# Patient Record
Sex: Female | Born: 1950 | Race: White | Hispanic: No | Smoking: Never smoker
Health system: Southern US, Community
[De-identification: ages and names within clinical notes are randomized; demographics above are authoritative.]

## PROBLEM LIST (undated history)

## (undated) HISTORY — PX: CORONARY ARTERY BYPASS GRAFT: SHX141

## (undated) HISTORY — PX: CARPAL TUNNEL RELEASE: SHX101

## (undated) HISTORY — PX: WRIST FRACTURE SURGERY: SHX121

## (undated) HISTORY — PX: CHOLECYSTECTOMY: SHX55

## (undated) HISTORY — PX: DILATION AND CURETTAGE OF UTERUS: SHX78

---

## 2014-08-27 DIAGNOSIS — L03116 Cellulitis of left lower limb: Secondary | ICD-10-CM | POA: Insufficient documentation

## 2016-05-11 DIAGNOSIS — E1165 Type 2 diabetes mellitus with hyperglycemia: Secondary | ICD-10-CM | POA: Insufficient documentation

## 2016-05-11 DIAGNOSIS — E785 Hyperlipidemia, unspecified: Secondary | ICD-10-CM | POA: Insufficient documentation

## 2016-05-11 DIAGNOSIS — I10 Essential (primary) hypertension: Secondary | ICD-10-CM | POA: Insufficient documentation

## 2016-05-11 HISTORY — DX: Type 2 diabetes mellitus with hyperglycemia: E11.65

## 2016-05-11 HISTORY — DX: Essential (primary) hypertension: I10

## 2016-05-11 HISTORY — DX: Hyperlipidemia, unspecified: E78.5

## 2016-05-12 DIAGNOSIS — J4520 Mild intermittent asthma, uncomplicated: Secondary | ICD-10-CM | POA: Insufficient documentation

## 2016-05-12 DIAGNOSIS — J45909 Unspecified asthma, uncomplicated: Secondary | ICD-10-CM

## 2016-05-12 HISTORY — DX: Unspecified asthma, uncomplicated: J45.909

## 2016-05-12 HISTORY — DX: Mild intermittent asthma, uncomplicated: J45.20

## 2017-02-14 DIAGNOSIS — N1832 Chronic kidney disease, stage 3b: Secondary | ICD-10-CM

## 2017-02-14 DIAGNOSIS — N182 Chronic kidney disease, stage 2 (mild): Secondary | ICD-10-CM | POA: Insufficient documentation

## 2017-02-14 HISTORY — DX: Chronic kidney disease, stage 3b: N18.32

## 2017-02-14 HISTORY — DX: Chronic kidney disease, stage 2 (mild): N18.2

## 2018-06-20 DIAGNOSIS — R413 Other amnesia: Secondary | ICD-10-CM | POA: Insufficient documentation

## 2018-06-20 DIAGNOSIS — R251 Tremor, unspecified: Secondary | ICD-10-CM | POA: Insufficient documentation

## 2018-06-20 DIAGNOSIS — R195 Other fecal abnormalities: Secondary | ICD-10-CM | POA: Insufficient documentation

## 2018-06-20 HISTORY — DX: Other amnesia: R41.3

## 2018-06-20 HISTORY — DX: Other fecal abnormalities: R19.5

## 2018-06-20 HISTORY — DX: Tremor, unspecified: R25.1

## 2018-09-12 DIAGNOSIS — Z9119 Patient's noncompliance with other medical treatment and regimen: Secondary | ICD-10-CM

## 2018-09-12 DIAGNOSIS — Z91199 Patient's noncompliance with other medical treatment and regimen due to unspecified reason: Secondary | ICD-10-CM

## 2018-09-12 HISTORY — DX: Patient's noncompliance with other medical treatment and regimen due to unspecified reason: Z91.199

## 2018-09-12 HISTORY — DX: Patient's noncompliance with other medical treatment and regimen: Z91.19

## 2019-05-29 DIAGNOSIS — L03116 Cellulitis of left lower limb: Secondary | ICD-10-CM

## 2019-05-29 HISTORY — DX: Cellulitis of left lower limb: L03.116

## 2019-07-18 ENCOUNTER — Other Ambulatory Visit: Payer: Self-pay

## 2019-07-18 ENCOUNTER — Ambulatory Visit (INDEPENDENT_AMBULATORY_CARE_PROVIDER_SITE_OTHER): Payer: Medicare Other | Admitting: Cardiology

## 2019-07-18 ENCOUNTER — Encounter: Payer: Self-pay | Admitting: Cardiology

## 2019-07-18 VITALS — BP 142/72 | HR 75 | Ht 59.5 in | Wt 169.8 lb

## 2019-07-18 DIAGNOSIS — I2510 Atherosclerotic heart disease of native coronary artery without angina pectoris: Secondary | ICD-10-CM

## 2019-07-18 DIAGNOSIS — R809 Proteinuria, unspecified: Secondary | ICD-10-CM

## 2019-07-18 DIAGNOSIS — I255 Ischemic cardiomyopathy: Secondary | ICD-10-CM

## 2019-07-18 DIAGNOSIS — IMO0002 Reserved for concepts with insufficient information to code with codable children: Secondary | ICD-10-CM | POA: Insufficient documentation

## 2019-07-18 DIAGNOSIS — E1129 Type 2 diabetes mellitus with other diabetic kidney complication: Secondary | ICD-10-CM

## 2019-07-18 DIAGNOSIS — I509 Heart failure, unspecified: Secondary | ICD-10-CM | POA: Insufficient documentation

## 2019-07-18 DIAGNOSIS — Z951 Presence of aortocoronary bypass graft: Secondary | ICD-10-CM

## 2019-07-18 DIAGNOSIS — K219 Gastro-esophageal reflux disease without esophagitis: Secondary | ICD-10-CM

## 2019-07-18 DIAGNOSIS — J4520 Mild intermittent asthma, uncomplicated: Secondary | ICD-10-CM

## 2019-07-18 DIAGNOSIS — I10 Essential (primary) hypertension: Secondary | ICD-10-CM

## 2019-07-18 DIAGNOSIS — I25810 Atherosclerosis of coronary artery bypass graft(s) without angina pectoris: Secondary | ICD-10-CM | POA: Insufficient documentation

## 2019-07-18 DIAGNOSIS — I429 Cardiomyopathy, unspecified: Secondary | ICD-10-CM | POA: Insufficient documentation

## 2019-07-18 DIAGNOSIS — F411 Generalized anxiety disorder: Secondary | ICD-10-CM | POA: Insufficient documentation

## 2019-07-18 DIAGNOSIS — I5043 Acute on chronic combined systolic (congestive) and diastolic (congestive) heart failure: Secondary | ICD-10-CM

## 2019-07-18 DIAGNOSIS — E1165 Type 2 diabetes mellitus with hyperglycemia: Secondary | ICD-10-CM

## 2019-07-18 DIAGNOSIS — Z794 Long term (current) use of insulin: Secondary | ICD-10-CM

## 2019-07-18 HISTORY — DX: Type 2 diabetes mellitus with other diabetic kidney complication: E11.65

## 2019-07-18 HISTORY — DX: Heart failure, unspecified: I50.9

## 2019-07-18 HISTORY — DX: Generalized anxiety disorder: F41.1

## 2019-07-18 HISTORY — DX: Cardiomyopathy, unspecified: I42.9

## 2019-07-18 HISTORY — DX: Type 2 diabetes mellitus with other diabetic kidney complication: E11.29

## 2019-07-18 HISTORY — DX: Essential (primary) hypertension: I10

## 2019-07-18 HISTORY — DX: Reserved for concepts with insufficient information to code with codable children: IMO0002

## 2019-07-18 HISTORY — DX: Presence of aortocoronary bypass graft: Z95.1

## 2019-07-18 HISTORY — DX: Atherosclerotic heart disease of native coronary artery without angina pectoris: I25.10

## 2019-07-18 HISTORY — DX: Gastro-esophageal reflux disease without esophagitis: K21.9

## 2019-07-18 MED ORDER — FUROSEMIDE 40 MG PO TABS: 40.0000 mg | ORAL_TABLET | Freq: Every day | ORAL | 2 refills | Status: DC

## 2019-07-18 MED ORDER — POTASSIUM CHLORIDE ER 10 MEQ PO TBCR: 10.0000 meq | EXTENDED_RELEASE_TABLET | Freq: Every day | ORAL | 2 refills | Status: DC

## 2019-07-18 NOTE — Patient Instructions (Addendum)
Medication Instructions:  Your physician recommends that you continue on your current medications as directed. Please refer to the Current Medication list given to you today:   START: Lasix 40 mg daily  START: Potasium 10 meq daily   *If you need a refill on your cardiac medications before your next appointment, please call your pharmacy*  Lab Work: Your physician recommends that you return for lab work today: pro bnp                                                                                                   Return in 2 weeks: pro bnp and bmp   If you have labs (blood work) drawn today and your tests are completely normal, you will receive your results only by: Marland Kitchen MyChart Message (if you have MyChart) OR . A paper copy in the mail If you have any lab test that is abnormal or we need to change your treatment, we will call you to review the results.  Testing/Procedures: None.   Follow-Up: At [facility], you and your health needs are our priority.  As part of our continuing mission to provide you with exceptional heart care, we have created designated Provider Care Teams.  These Care Teams include your primary Cardiologist (physician) and Advanced Practice Providers (APPs -  Physician Assistants and Nurse Practitioners) who all work together to provide you with the care you need, when you need it.  Your next appointment:   1 week(s)  The format for your next appointment:   In Person  Provider:   [name], MD  Other Instructions  Furosemide tablets What is this medicine? FUROSEMIDE (fyoor [place] se mide) is a diuretic. It helps you make more urine and to lose salt and excess water from your body. This medicine is used to treat high blood pressure, and edema or swelling from heart, kidney, or liver disease. This medicine may be used for other purposes; ask your health care provider or pharmacist if you have questions. COMMON BRAND NAME(S): Active-Medicated Specimen Kit,  Delone, Diuscreen, Lasix, RX Specimen Collection Kit, Specimen Collection Kit, URINX Medicated Specimen Collection What should I tell my health care provider before I take this medicine? They need to know if you have any of these conditions:  abnormal blood electrolytes  diarrhea or vomiting  gout  heart disease  kidney disease, small amounts of urine, or difficulty passing urine  liver disease  thyroid disease  an unusual or allergic reaction to furosemide, sulfa drugs, other medicines, foods, dyes, or preservatives  pregnant or trying to get pregnant  breast-feeding How should I use this medicine? Take this medicine by mouth with a glass of water. Follow the directions on the prescription label. You may take this medicine with or without food. If it upsets your stomach, take it with food or milk. Do not take your medicine more often than directed. Remember that you will need to pass more urine after taking this medicine. Do not take your medicine at a time of day that will cause you problems. Do not take at bedtime. Talk  to your pediatrician regarding the use of this medicine in children. While this drug may be prescribed for selected conditions, precautions do apply. Overdosage: If you think you have taken too much of this medicine contact a poison control center or emergency room at once. NOTE: This medicine is only for you. Do not share this medicine with others. What if I miss a dose? If you miss a dose, take it as soon as you can. If it is almost time for your next dose, take only that dose. Do not take double or extra doses. What may interact with this medicine?  aspirin and aspirin-like medicines  certain antibiotics  chloral hydrate  cisplatin  cyclosporine  digoxin  diuretics  laxatives  lithium  medicines for blood pressure  medicines that relax muscles for surgery  methotrexate  NSAIDs, medicines for pain and inflammation like ibuprofen, naproxen,  or indomethacin  phenytoin  steroid medicines like prednisone or cortisone  sucralfate  thyroid hormones This list may not describe all possible interactions. Give your health care provider a list of all the medicines, herbs, non-prescription drugs, or dietary supplements you use. Also tell them if you smoke, drink alcohol, or use illegal drugs. Some items may interact with your medicine. What should I watch for while using this medicine? Visit your doctor or health care provider for regular checks on your progress. Check your blood pressure regularly. Ask your doctor or health care provider what your blood pressure should be, and when you should contact him or her. If you are a diabetic, check your blood sugar as directed. This medicine may cause serious skin reactions. They can happen weeks to months after starting the medicine. Contact your health care provider right away if you notice fevers or flu-like symptoms with a rash. The rash may be red or purple and then turn into blisters or peeling of the skin. Or, you might notice a red rash with swelling of the face, lips or lymph nodes in your neck or under your arms. You may need to be on a special diet while taking this medicine. Check with your doctor. Also, ask how many glasses of fluid you need to drink a day. You must not get dehydrated. You may get drowsy or dizzy. Do not drive, use machinery, or do anything that needs mental alertness until you know how this drug affects you. Do not stand or sit up quickly, especially if you are an older patient. This reduces the risk of dizzy or fainting spells. Alcohol can make you more drowsy and dizzy. Avoid alcoholic drinks. This medicine can make you more sensitive to the sun. Keep out of the sun. If you cannot avoid being in the sun, wear protective clothing and use sunscreen. Do not use sun lamps or tanning beds/booths. What side effects may I notice from receiving this medicine? Side effects that  you should report to your doctor or health care professional as soon as possible:  blood in urine or stools  dry mouth  fever or chills  hearing loss or ringing in the ears  irregular heartbeat  muscle pain or weakness, cramps  rash, fever, and swollen lymph nodes  redness, blistering, peeling or loosening of the skin, including inside the mouth  skin rash  stomach upset, pain, or nausea  tingling or numbness in the hands or feet  unusually weak or tired  vomiting or diarrhea  yellowing of the eyes or skin Side effects that usually do not require medical attention (report  to your doctor or health care professional if they continue or are bothersome):  headache  loss of appetite  unusual bleeding or bruising This list may not describe all possible side effects. Call your doctor for medical advice about side effects. You may report side effects to FDA at 1-800-FDA-1088. Where should I keep my medicine? Keep out of the reach of children. Store at room temperature between 15 and 30 degrees C (59 and 86 degrees F). Protect from light. Throw away any unused medicine after the expiration date. NOTE: This sheet is a summary. It may not cover all possible information. If you have questions about this medicine, talk to your doctor, pharmacist, or health care provider.  2020 Elsevier/Gold Standard (2018-10-19 14:04:13)  Potassium chloride tablets, extended-release tablets or capsules What is this medicine? POTASSIUM CHLORIDE (poe TASS i um KLOOR ide) is a potassium supplement used to prevent and to treat low potassium. Potassium is important for the heart, muscles, and nerves. Too much or too little potassium in the body can cause serious problems. This medicine may be used for other purposes; ask your health care provider or pharmacist if you have questions. COMMON BRAND NAME(S): ED-K+10, K-10, K-8, K-Dur, K-Tab, Kaon-CL, Klor-Con, Klor-Con M10, Klor-Con M15, Klor-Con M20,  Klotrix, Micro-K, Micro-K Extencaps, Slow-K What should I tell my health care provider before I take this medicine? They need to know if you have any of these conditions:  Addison's disease  dehydration  diabetes  difficulty swallowing  heart disease  high levels of potassium in the blood  irregular heartbeat  kidney disease  recent severe burn  stomach ulcers or other stomach problems  an unusual or allergic reaction to potassium, tartrazine, other medicines, foods, dyes, or preservatives  pregnant or trying to get pregnant  breast-feeding How should I use this medicine? Take this medicine by mouth with a full glass of water. Take with food. Follow the directions on the prescription label. Do not suck on, crush, or chew this medicine. If you have difficulty swallowing, ask the pharmacist how to take. Take your medicine at regular intervals. Do not take it more often than directed. Do not stop taking except on your doctor's advice. Talk to your pediatrician regarding the use of this medicine in children. Special care may be needed. Overdosage: If you think you have taken too much of this medicine contact a poison control center or emergency room at once. NOTE: This medicine is only for you. Do not share this medicine with others. What if I miss a dose? If you miss a dose, take it as soon as you can. If it is almost time for your next dose, take only that dose. Do not take double or extra doses. What may interact with this medicine? Do not take this medicine with any of the following medications:  certain diuretics such as spironolactone, triamterene  certain medicines for stomach problems like atropine; difenoxin and glycopyrrolate  eplerenone  sodium polystyrene sulfonate This medicine may also interact with the following medications:  certain medicines for blood pressure or heart disease like lisinopril, losartan, quinapril, valsartan  medicines that lower your  chance of fighting infection such as cyclosporine, tacrolimus  NSAIDs, medicines for pain and inflammation, like ibuprofen or naproxen  other potassium supplements  salt substitutes This list may not describe all possible interactions. Give your health care provider a list of all the medicines, herbs, non-prescription drugs, or dietary supplements you use. Also tell them if you smoke, drink alcohol, or  use illegal drugs. Some items may interact with your medicine. What should I watch for while using this medicine? Visit your doctor or health care professional for regular check ups. You will need lab work done regularly. You may need to be on a special diet while taking this medicine. Ask your doctor. What side effects may I notice from receiving this medicine? Side effects that you should report to your doctor or health care professional as soon as possible:  allergic reactions like skin rash, itching or hives, swelling of the face, lips, or tongue  black, tarry stools  breathing problems  confusion  heartburn  fast, irregular heartbeat  feeling faint or lightheaded, falls  low blood pressure  numbness or tingling in hands or feet  pain when swallowing  unusually weak or tired  weakness, heaviness of legs Side effects that usually do not require medical attention (report to your doctor or health care professional if they continue or are bothersome):  diarrhea  nausea, vomiting  stomach pain This list may not describe all possible side effects. Call your doctor for medical advice about side effects. You may report side effects to FDA at 1-800-FDA-1088. Where should I keep my medicine? Keep out of the reach of children. Store at room temperature between 15 and 30 degrees C (59 and 86 degrees F ). Keep bottle closed tightly to protect this medicine from light and moisture. Throw away any unused medicine after the expiration date. NOTE: This sheet is a summary. It may not  cover all possible information. If you have questions about this medicine, talk to your doctor, pharmacist, or health care provider.  2020 Elsevier/Gold Standard (2016-04-20 11:43:27)

## 2019-07-18 NOTE — Progress Notes (Signed)
Cardiology Consultation:    Date:  07/18/2019   ID:  Alexandra Baker, DOB April 19, 1951, MRN [id number]  PCP:  Alma Friendly, MD  Cardiologist:  [name], MD   Referring MD: Alma Friendly, MD   Chief Complaint  Patient presents with  . Coronary Artery Disease  . Hypertension    History of Present Illness:    Alexandra Baker is a 68 y.o. female who is being seen today for the evaluation of short of breath at the request of Alma Friendly, MD.  Past medical history significant for coronary artery disease, she did have coronary bypass grafting 1999 secondary to myocardial infarction.  It was done at [facility].  She also does have history of diabetes which is poorly controlled, dyslipidemia, hypertension.  Recently she is being seen pulmonary specialist because of progressive shortness of breath.  She also described to have some swelling of lower extremities.  Progressive shortness of breath getting worse day by day.  She cannot sleep at night in the bed laying flat because of shortness of breath.  She spent her night sitting in the chair.  She cannot tell me if she gained any weight recently.  But she described to have swelling of lower extremities which is progressively been getting worse.  She being treated for cellulitis with some limited success.  Denies having any chest pain tightness squeezing pressure burning chest.  She does not smoke, never did, does have history of hyper lipidemia.   No past medical history on file.    Current Medications: Current Meds  Medication Sig  . albuterol (VENTOLIN HFA) 108 (90 Base) MCG/ACT inhaler Inhale 2 puffs into the lungs every 6 (six) hours as needed.  . Aspirin Buf,CaCarb-MgCarb-MgO, 81 MG TABS Take 1 tablet by mouth daily.  Marland Kitchen atenolol (TENORMIN) 50 MG tablet Take 50 mg by mouth daily.  . budesonide-formoterol (SYMBICORT) 160-4.5 MCG/ACT inhaler Inhale 2 puffs into the lungs 2 (two) times daily.  . Insulin  Glargine, 1 Unit Dial, (TOUJEO SOLOSTAR) 300 UNIT/ML SOPN Inject 40 Units into the skin daily.  Marland Kitchen lisinopril-hydrochlorothiazide (ZESTORETIC) 20-12.5 MG tablet Take 1 tablet by mouth daily.  Marland Kitchen loratadine (CLARITIN) 10 MG tablet Take 1 mg by mouth daily.  . metFORMIN (GLUCOPHAGE) 1000 MG tablet Take 1,000 mg by mouth 2 (two) times daily.  . Multiple Vitamin (MULTIVITAMIN) capsule Take 1 capsule by mouth daily.  . nitroGLYCERIN (NITROLINGUAL) 0.4 MG/SPRAY spray Place 1 spray under the tongue as needed for chest pain.  Marland Kitchen omeprazole (PRILOSEC) 40 MG capsule Take 1 capsule by mouth daily.  . rosuvastatin (CRESTOR) 40 MG tablet Take 40 mg by mouth daily.     Allergies:   Tea, Ezetimibe-simvastatin, and Pitocin [oxytocin]   Social History   Socioeconomic History  . Marital status: Married    Spouse name: Not on file  . Number of children: Not on file  . Years of education: Not on file  . Highest education level: Not on file  Occupational History  . Not on file  Tobacco Use  . Smoking status: Never Smoker  . Smokeless tobacco: Never Used  Substance and Sexual Activity  . Alcohol use: Never  . Drug use: Never  . Sexual activity: Not on file  Other Topics Concern  . Not on file  Social History Narrative  . Not on file   Social Determinants of Health   Financial Resource Strain:   . Difficulty of Paying Living Expenses: Not on file  Food Insecurity:   .  Worried About Charity fundraiser in the Last Year: Not on file  . Ran Out of Food in the Last Year: Not on file  Transportation Needs:   . Lack of Transportation (Medical): Not on file  . Lack of Transportation (Non-Medical): Not on file  Physical Activity:   . Days of Exercise per Week: Not on file  . Minutes of Exercise per Session: Not on file  Stress:   . Feeling of Stress : Not on file  Social Connections:   . Frequency of Communication with Friends and Family: Not on file  . Frequency of Social Gatherings with Friends  and Family: Not on file  . Attends Religious Services: Not on file  . Active Member of Clubs or Organizations: Not on file  . Attends Archivist Meetings: Not on file  . Marital Status: Not on file     Family History: [name]; Aneurysm in her father; Breast cancer in her mother; Hypertension in her mother; Kidney failure in her mother. ROS:   Please see the history of present illness.    All 14 point review of systems negative except as described per history of present illness.  EKGs/Labs/Other Studies Reviewed:    The following studies were reviewed today: Echocardiogram reviewed done at [facility] showed: Left Ventricle: Systolic function is mildly decreased with an ejection  fraction of 45-50%. . Left Ventricle: There is grade II (moderate) diastolic dysfunction. . Mitral Valve: There is mild regurgitation. . Tricuspid Valve: There is mild regurgitation. . Tricuspid Valve: The estimated pulmonary pressures are moderately  elevated.   Recent Labs: No results found for requested labs within last 8760 hours.  Recent Lipid Panel No results found for: CHOL, TRIG, HDL, CHOLHDL, VLDL, LDLCALC, LDLDIRECT  Physical Exam:    VS:  BP (!) 142/72   Pulse 75   Ht 4' 11.5" (1.511 m)   Wt 169 lb 12.8 oz (77 kg)   SpO2 96%   BMI 33.72 kg/m     Wt Readings from Last 3 Encounters:  07/18/19 169 lb 12.8 oz (77 kg)     GEN:  Well nourished, well developed in no acute distress HEENT: Normal NECK: JVD elevated at 45 degrees already LYMPHATICS: No lymphadenopathy CARDIAC: RRR, no murmurs, no rubs, no gallops RESPIRATORY:  Clear to auscultation without rales, wheezing or rhonchi  ABDOMEN: Soft, non-tender, non-distended MUSCULOSKELETAL:  No edema; No deformity  SKIN: Warm and dry NEUROLOGIC:  Alert and oriented x 3 PSYCHIATRIC:  Normal affect  Lower extremities 1+ pitting edema  ASSESSMENT:    1.  Uncontrolled type 2 diabetes mellitus with microalbuminuria, with long-term current use of insulin ([name])   2. Status post coronary artery bypass graft 1999 done at [name]   3. Acute on chronic combined systolic and diastolic congestive heart failure ([place])   4. Ischemic cardiomyopathy   5. Mild intermittent asthma without complication   6. Benign essential hypertension    PLAN:    In order of problems listed above:  1. Congestive heart failure with she appears to be at least mildly decompensated.  I will initiate diuresis.  I will give her 40 mg of Lasix with 10 mg of potassium.  proBNP will be checked today within the next week or so we will recheck her proBNP as well as Chem-7.  In the future anticipate need to switch her from Zestoretic to [place]. 2. In terms of etiology of  her cardiomyopathy which is ejection fraction 4045% probably ischemic is most likely explanation.  In the future we will need to do ischemia work-up.  Which we will do it depends on how she will progress. 3. Questionable pulmonary hypertension I do have description of her echocardiogram from [place].  It said to moderate pulmonary hypertension however what I can gather from the report is pulmonary pressure 45 mmHg.  Etiology of this phenomenon is unclear at this moment.  She is scheduled to have a sleep study however she cannot lay flat we need to get her congestive heart failure better before proceeding with sleep study.  Therefore again diuresis will be initiated I will see her back in my office within a week or so and will aggressively quickly proceed with work-up 20 forgot exactly what the problem is.  She may require cardiac catheterization to reassess her pulmonary pressure as well as look at her coronary arteries.  I hope that her pulmonary pressure will improve with better management of her heart issues.  I still think it would be reasonable to perform sleep study. 4. Ischemic cardiomyopathy she is  on Zestoretic which is lisinopril, she is not on Tenormin which I will continue for now but in the future anticipated to need to switch to probably carvedilol.  Also will hopefully in the future switch from Zestoretic to [facility]. 5.  Diabetes mellitus poorly controlled.  Will talk to primary care physician try to see if we can improve control of this problem.  She admits that she is not sticking with good diet. Cholesterol status she is taking high intensity statin in form of Crestor which I will continue for now.  Overall very complicated situation will quickly try to diurese her see if she feels any better.  Then will perform work-up for her CAD as well as for potential pulmonary hypertension.   Medication Adjustments/Labs and Tests Ordered: Current medicines are reviewed at length with the patient today.  Concerns regarding medicines are outlined above.  No orders of the defined types were placed in this encounter.  No orders of the defined types were placed in this encounter.   Signed, Park Liter, MD, [facility]. 07/18/2019 11:05 AM    [place]

## 2019-07-19 LAB — PRO B NATRIURETIC PEPTIDE: NT-Pro BNP: 3381 pg/mL — ABNORMAL HIGH (ref 0–301)

## 2019-07-22 ENCOUNTER — Telehealth: Payer: Self-pay | Admitting: Emergency Medicine

## 2019-07-22 NOTE — Telephone Encounter (Signed)
Left message for patient to return call regarding results  

## 2019-07-25 ENCOUNTER — Encounter: Payer: Self-pay | Admitting: Cardiology

## 2019-07-25 ENCOUNTER — Other Ambulatory Visit: Payer: Self-pay

## 2019-07-25 ENCOUNTER — Ambulatory Visit (INDEPENDENT_AMBULATORY_CARE_PROVIDER_SITE_OTHER): Payer: Medicare Other | Admitting: Cardiology

## 2019-07-25 VITALS — BP 120/78 | HR 71 | Ht <= 58 in | Wt 158.6 lb

## 2019-07-25 DIAGNOSIS — Z951 Presence of aortocoronary bypass graft: Secondary | ICD-10-CM

## 2019-07-25 DIAGNOSIS — R809 Proteinuria, unspecified: Secondary | ICD-10-CM

## 2019-07-25 DIAGNOSIS — E1129 Type 2 diabetes mellitus with other diabetic kidney complication: Secondary | ICD-10-CM

## 2019-07-25 DIAGNOSIS — I255 Ischemic cardiomyopathy: Secondary | ICD-10-CM | POA: Diagnosis not present

## 2019-07-25 DIAGNOSIS — IMO0002 Reserved for concepts with insufficient information to code with codable children: Secondary | ICD-10-CM

## 2019-07-25 DIAGNOSIS — I10 Essential (primary) hypertension: Secondary | ICD-10-CM | POA: Diagnosis not present

## 2019-07-25 DIAGNOSIS — I5043 Acute on chronic combined systolic (congestive) and diastolic (congestive) heart failure: Secondary | ICD-10-CM

## 2019-07-25 DIAGNOSIS — Z794 Long term (current) use of insulin: Secondary | ICD-10-CM

## 2019-07-25 DIAGNOSIS — E1165 Type 2 diabetes mellitus with hyperglycemia: Secondary | ICD-10-CM

## 2019-07-25 NOTE — Patient Instructions (Signed)
Medication Instructions:  Your physician recommends that you continue on your current medications as directed. Please refer to the Current Medication list given to you today.  *If you need a refill on your cardiac medications before your next appointment, please call your pharmacy*  Lab Work: Your physician recommends that you return for lab work today: bmp, pro bnp   If you have labs (blood work) drawn today and your tests are completely normal, you will receive your results only by: Marland Kitchen MyChart Message (if you have MyChart) OR . A paper copy in the mail If you have any lab test that is abnormal or we need to change your treatment, we will call you to review the results.  Testing/Procedures: None.   Follow-Up: At [facility], you and your health needs are our priority.  As part of our continuing mission to provide you with exceptional heart care, we have created designated Provider Care Teams.  These Care Teams include your primary Cardiologist (physician) and Advanced Practice Providers (APPs -  Physician Assistants and Nurse Practitioners) who all work together to provide you with the care you need, when you need it.  Your next appointment:   3 week(s)  The format for your next appointment:   In Person  Provider:   [name], MD  Other Instructions

## 2019-07-25 NOTE — Progress Notes (Signed)
Cardiology Office Note:    Date:  07/25/2019   ID:  Alexandra Baker, DOB 01-Oct-1950, MRN [id number]  PCP:  Alma Friendly, MD  Cardiologist:  [name], MD    Referring MD: Alma Friendly, MD   Chief Complaint  Patient presents with  . Follow-up    History of Present Illness:    Alexandra Baker is a 68 y.o. female with past medical history significant for coronary artery disease, coronary bypass graft 1999 at [facility], cardiomyopathy with latest estimation of ejection fraction 40 to 45%.  When I see her last time she was in trouble she was short of breath I gave her diuretic in form of Lasix 40 mg and potassium and she looks and feels dramatically better.  Still there is some swelling she lost 11 pounds.  The purpose of visit today is to make sure she is doing well and she is  History reviewed. No pertinent past medical history.  Past Surgical History:  Procedure Laterality Date  . CARPAL TUNNEL RELEASE    . CESAREAN SECTION    . CHOLECYSTECTOMY    . CORONARY ARTERY BYPASS GRAFT    . DILATION AND CURETTAGE OF UTERUS    . WRIST FRACTURE SURGERY      Current Medications: Current Meds  Medication Sig  . albuterol (VENTOLIN HFA) 108 (90 Base) MCG/ACT inhaler Inhale 2 puffs into the lungs every 6 (six) hours as needed.  . Aspirin Buf,CaCarb-MgCarb-MgO, 81 MG TABS Take 1 tablet by mouth daily.  Marland Kitchen atenolol (TENORMIN) 50 MG tablet Take 50 mg by mouth daily.  . budesonide-formoterol (SYMBICORT) 160-4.5 MCG/ACT inhaler Inhale 1 puff into the lungs daily.   . furosemide (LASIX) 40 MG tablet Take 1 tablet (40 mg total) by mouth daily.  . Insulin Glargine, 1 Unit Dial, (TOUJEO SOLOSTAR) 300 UNIT/ML SOPN Inject 40 Units into the skin daily.  Marland Kitchen lisinopril-hydrochlorothiazide (ZESTORETIC) 20-12.5 MG tablet Take 1 tablet by mouth daily.  Marland Kitchen loratadine (CLARITIN) 10 MG tablet Take 1 mg by mouth daily.  . metFORMIN (GLUCOPHAGE) 1000 MG tablet Take 1,000 mg by  mouth 2 (two) times daily.  . Multiple Vitamin (MULTIVITAMIN) capsule Take 1 capsule by mouth daily.  . nitroGLYCERIN (NITROLINGUAL) 0.4 MG/SPRAY spray Place 1 spray under the tongue as needed for chest pain.  Marland Kitchen omeprazole (PRILOSEC) 40 MG capsule Take 1 capsule by mouth daily.  . potassium chloride (KLOR-CON) 10 MEQ tablet Take 1 tablet (10 mEq total) by mouth daily.  . rosuvastatin (CRESTOR) 40 MG tablet Take 40 mg by mouth daily.     Allergies:   Tea, Ezetimibe-simvastatin, and Pitocin [oxytocin]   Social History   Socioeconomic History  . Marital status: Married    Spouse name: Not on file  . Number of children: Not on file  . Years of education: Not on file  . Highest education level: Not on file  Occupational History  . Not on file  Tobacco Use  . Smoking status: Never Smoker  . Smokeless tobacco: Never Used  Substance and Sexual Activity  . Alcohol use: Never  . Drug use: Never  . Sexual activity: Not on file  Other Topics Concern  . Not on file  Social History Narrative  . Not on file   Social Determinants of Health   Financial Resource Strain:   . Difficulty of Paying Living Expenses: Not on file  Food Insecurity:   . Worried About Charity fundraiser in the Last Year: Not on file  .  Ran Out of Food in the Last Year: Not on file  Transportation Needs:   . Lack of Transportation (Medical): Not on file  . Lack of Transportation (Non-Medical): Not on file  Physical Activity:   . Days of Exercise per Week: Not on file  . Minutes of Exercise per Session: Not on file  Stress:   . Feeling of Stress : Not on file  Social Connections:   . Frequency of Communication with Friends and Family: Not on file  . Frequency of Social Gatherings with Friends and Family: Not on file  . Attends Religious Services: Not on file  . Active Member of Clubs or Organizations: Not on file  . Attends Archivist Meetings: Not on file  . Marital Status: Not on file      Family History: [name]; Aneurysm in her father; Breast cancer in her mother; Diabetes in her sister, sister, sister, and sister; Hypertension in her mother; Kidney failure in her mother. ROS:   Please see the history of present illness.    All 14 point review of systems negative except as described per history of present illness  EKGs/Labs/Other Studies Reviewed:      Recent Labs: 07/18/2019: NT-Pro BNP 3,381  Recent Lipid Panel No results found for: CHOL, TRIG, HDL, CHOLHDL, VLDL, LDLCALC, LDLDIRECT  Physical Exam:    VS:  BP 120/78 (BP Location: Right Arm, Patient Position: Sitting)   Pulse 71   Ht 4\' 9"  (1.448 m)   Wt 158 lb 9.6 oz (71.9 kg)   SpO2 93%   BMI 34.32 kg/m     Wt Readings from Last 3 Encounters:  07/25/19 158 lb 9.6 oz (71.9 kg)  07/18/19 169 lb 12.8 oz (77 kg)     GEN:  Well nourished, well developed in no acute distress HEENT: Normal NECK: No JVD; No carotid bruits LYMPHATICS: No lymphadenopathy CARDIAC: RRR, no murmurs, no rubs, no gallops RESPIRATORY:  Clear to auscultation without rales, wheezing or rhonchi  ABDOMEN: Soft, non-tender, non-distended MUSCULOSKELETAL:  No edema; No deformity  SKIN: Warm and dry LOWER EXTREMITIES: 1+ swelling NEUROLOGIC:  Alert and oriented x 3 PSYCHIATRIC:  Normal affect   ASSESSMENT:    1. Ischemic cardiomyopathy   2. Acute on chronic combined systolic and diastolic congestive heart failure ([place])   3. Benign essential hypertension   4. Uncontrolled type 2 diabetes mellitus with microalbuminuria, with long-term current use of insulin (HCC)   5. Status post coronary artery bypass graft 1999 done at [facility]    PLAN:    In order of problems listed above:  1. Ischemic cardiomyopathy.  On Zestoretic as well as Tenormin.  We will continue for now but in the future plan will be to switch her to [facility] as well as most likely  Coreg. 2. Congestive heart failure dramatic improved I will check Chem-7 as well as proBNP today and based on that we will decide about therapy 3. Essential hypertension blood pressure well controlled continue present management. 4. Diabetes followed by 10 medicine team. 5. Status post coronary bypass graft noted.  In the future she will require ischemia work-up   Medication Adjustments/Labs and Tests Ordered: Current medicines are reviewed at length with the patient today.  Concerns regarding medicines are outlined above.  No orders of the defined types were placed in this encounter.  Medication changes: No orders of the defined types were placed in this encounter.  Signed, Park Liter, MD, [facility] 07/25/2019 11:12 AM    [place]

## 2019-07-26 ENCOUNTER — Telehealth: Payer: Self-pay | Admitting: Physician Assistant

## 2019-07-26 LAB — BASIC METABOLIC PANEL
BUN/Creatinine Ratio: 25 (ref 12–28)
BUN: 24 mg/dL (ref 8–27)
CO2: 20 mmol/L (ref 20–29)
Calcium: 9.2 mg/dL (ref 8.7–10.3)
Chloride: 92 mmol/L — ABNORMAL LOW (ref 96–106)
Creatinine, Ser: 0.97 mg/dL (ref 0.57–1.00)
GFR calc Af Amer: 70 mL/min/{1.73_m2} (ref >59)
GFR calc non Af Amer: 61 mL/min/{1.73_m2} (ref >59)
Glucose: 546 mg/dL (ref 65–99)
Potassium: 4.1 mmol/L (ref 3.5–5.2)
Sodium: 131 mmol/L — ABNORMAL LOW (ref 134–144)

## 2019-07-26 LAB — PRO B NATRIURETIC PEPTIDE: NT-Pro BNP: 3290 pg/mL — ABNORMAL HIGH (ref 0–301)

## 2019-07-26 NOTE — Telephone Encounter (Signed)
[facility] called because pt CBG> 500.   I called the patient, but got no answer. I left a message on her home phone regarding her elevated blood sugar.   Rosaria Ferries, PA-C 07/26/2019 12:46 PM [facility] [phone]

## 2019-07-31 ENCOUNTER — Telehealth: Payer: Self-pay | Admitting: *Deleted

## 2019-07-31 MED ORDER — FUROSEMIDE 40 MG PO TABS: 60.0000 mg | ORAL_TABLET | Freq: Every day | ORAL | 3 refills | Status: DC

## 2019-07-31 NOTE — Telephone Encounter (Signed)
-----   Message from Park Liter, MD sent at 07/30/2019  9:55 PM EST ----- Chem-7 looks good blood glucose very high.  Please increase Lasix to 60 mg daily.  Need to follow-up with primary care physician for diabetes control

## 2019-07-31 NOTE — Telephone Encounter (Signed)
Pt has been notified of lab results by phone with verbal understanding. Pt states she is past due to see her Endocrinologist and that she will f/u with either PCP or Endocrinologist in regards glucose level 546. Pt is agreeable to increase lasix to 60 mg daily. New R has been sent in for new directions take 1 and 1/2 tabs daily. Pt thanked me for the cal and the help today. Patient notified of result.  Please refer to phone note from today for complete details.   Julaine Hua, [place] 07/31/2019 2:59 PM

## 2019-08-15 ENCOUNTER — Ambulatory Visit (INDEPENDENT_AMBULATORY_CARE_PROVIDER_SITE_OTHER): Payer: Medicare Other | Admitting: Cardiology

## 2019-08-15 ENCOUNTER — Ambulatory Visit: Payer: Medicare Other | Admitting: Cardiology

## 2019-08-15 ENCOUNTER — Other Ambulatory Visit: Payer: Self-pay

## 2019-08-15 ENCOUNTER — Encounter: Payer: Self-pay | Admitting: Cardiology

## 2019-08-15 VITALS — BP 132/92 | HR 94 | Ht <= 58 in | Wt 151.0 lb

## 2019-08-15 DIAGNOSIS — I5042 Chronic combined systolic (congestive) and diastolic (congestive) heart failure: Secondary | ICD-10-CM

## 2019-08-15 DIAGNOSIS — I10 Essential (primary) hypertension: Secondary | ICD-10-CM

## 2019-08-15 DIAGNOSIS — I255 Ischemic cardiomyopathy: Secondary | ICD-10-CM | POA: Diagnosis not present

## 2019-08-15 DIAGNOSIS — E782 Mixed hyperlipidemia: Secondary | ICD-10-CM | POA: Diagnosis not present

## 2019-08-15 MED ORDER — CARVEDILOL 6.25 MG PO TABS: 6.2500 mg | ORAL_TABLET | Freq: Two times a day (BID) | ORAL | 1 refills | Status: DC

## 2019-08-15 NOTE — Addendum Note (Signed)
Addended by: Ashok Norris on: 08/15/2019 03:50 PM   Modules accepted: Orders

## 2019-08-15 NOTE — Progress Notes (Signed)
Cardiology Office Note:    Date:  08/15/2019   ID:  Alexandra Baker, DOB 11/22/1950, MRN [id number]  PCP:  Alma Friendly, MD  Cardiologist:  [name], MD    Referring MD: Alma Friendly, MD   Chief Complaint  Patient presents with  . Follow-up    History of Present Illness:    Alexandra Baker is a 69 y.o. female  Past medical history significant for coronary artery disease, she did have coronary bypass grafting 1999 secondary to myocardial infarction.  It was done at [facility].  She also does have history of diabetes which is poorly controlled, dyslipidemia, hypertension.  She came to me with congestive heart failure.  She received diuretic with very good response.  And is doing dramatically better now it is time to put on the right medications.  Today I will stop her atenolol I will put her on carvedilol 6.25 twice daily.  We will continue with ACE inhibitor next visit I will switch her from ACE inhibitor to [facility].  Then I will add Aldactone.  In the future we may consider medication like [place] or Jardiance.  Also in the future will do stress testing.  History reviewed. No pertinent past medical history.  Past Surgical History:  Procedure Laterality Date  . CARPAL TUNNEL RELEASE    . CESAREAN SECTION    . CHOLECYSTECTOMY    . CORONARY ARTERY BYPASS GRAFT    . DILATION AND CURETTAGE OF UTERUS    . WRIST FRACTURE SURGERY      Current Medications: Current Meds  Medication Sig  . albuterol (VENTOLIN HFA) 108 (90 Base) MCG/ACT inhaler Inhale 2 puffs into the lungs every 6 (six) hours as needed.  . Aspirin Buf,CaCarb-MgCarb-MgO, 81 MG TABS Take 1 tablet by mouth daily.  Marland Kitchen atenolol (TENORMIN) 50 MG tablet Take 50 mg by mouth daily.  . budesonide-formoterol (SYMBICORT) 160-4.5 MCG/ACT inhaler Inhale 1 puff into the lungs daily.   . furosemide (LASIX) 40 MG tablet Take 1.5 tablets (60 mg total) by mouth daily.  . Insulin Glargine, 1 Unit Dial,  (TOUJEO SOLOSTAR) 300 UNIT/ML SOPN Inject 40 Units into the skin daily.  Marland Kitchen lisinopril-hydrochlorothiazide (ZESTORETIC) 20-12.5 MG tablet Take 1 tablet by mouth daily.  Marland Kitchen loratadine (CLARITIN) 10 MG tablet Take 1 mg by mouth daily.  . metFORMIN (GLUCOPHAGE) 1000 MG tablet Take 1,000 mg by mouth 2 (two) times daily.  . Multiple Vitamin (MULTIVITAMIN) capsule Take 1 capsule by mouth daily.  . nitroGLYCERIN (NITROLINGUAL) 0.4 MG/SPRAY spray Place 1 spray under the tongue as needed for chest pain.  Marland Kitchen omeprazole (PRILOSEC) 40 MG capsule Take 1 capsule by mouth daily.  . potassium chloride (KLOR-CON) 10 MEQ tablet Take 1 tablet (10 mEq total) by mouth daily.  . rosuvastatin (CRESTOR) 40 MG tablet Take 40 mg by mouth daily.     Allergies:   Tea, Ezetimibe-simvastatin, and Pitocin [oxytocin]   Social History   Socioeconomic History  . Marital status: Married    Spouse name: Not on file  . Number of children: Not on file  . Years of education: Not on file  . Highest education level: Not on file  Occupational History  . Not on file  Tobacco Use  . Smoking status: Never Smoker  . Smokeless tobacco: Never Used  Substance and Sexual Activity  . Alcohol use: Never  . Drug use: Never  . Sexual activity: Not on file  Other Topics Concern  . Not on file  Social History  Narrative  . Not on file   Social Determinants of Health   Financial Resource Strain:   . Difficulty of Paying Living Expenses: Not on file  Food Insecurity:   . Worried About Charity fundraiser in the Last Year: Not on file  . Ran Out of Food in the Last Year: Not on file  Transportation Needs:   . Lack of Transportation (Medical): Not on file  . Lack of Transportation (Non-Medical): Not on file  Physical Activity:   . Days of Exercise per Week: Not on file  . Minutes of Exercise per Session: Not on file  Stress:   . Feeling of Stress : Not on file  Social Connections:   . Frequency of Communication with Friends and  Family: Not on file  . Frequency of Social Gatherings with Friends and Family: Not on file  . Attends Religious Services: Not on file  . Active Member of Clubs or Organizations: Not on file  . Attends Archivist Meetings: Not on file  . Marital Status: Not on file     Family History: [name]; Aneurysm in her father; Breast cancer in her mother; Diabetes in her sister, sister, sister, and sister; Hypertension in her mother; Kidney failure in her mother. ROS:   Please see the history of present illness.    All 14 point review of systems negative except as described per history of present illness  EKGs/Labs/Other Studies Reviewed:      Recent Labs: 07/25/2019: BUN 24; Creatinine, Ser 0.97; NT-Pro BNP 3,290; Potassium 4.1; Sodium 131  Recent Lipid Panel No results found for: CHOL, TRIG, HDL, CHOLHDL, VLDL, LDLCALC, LDLDIRECT  Physical Exam:    VS:  BP (!) 132/92   Pulse 94   Ht 4\' 9"  (1.448 m)   Wt 151 lb (68.5 kg)   SpO2 95%   BMI 32.68 kg/m     Wt Readings from Last 3 Encounters:  08/15/19 151 lb (68.5 kg)  07/25/19 158 lb 9.6 oz (71.9 kg)  07/18/19 169 lb 12.8 oz (77 kg)     GEN:  Well nourished, well developed in no acute distress HEENT: Normal NECK: No JVD; No carotid bruits LYMPHATICS: No lymphadenopathy CARDIAC: RRR, no murmurs, no rubs, no gallops RESPIRATORY:  Clear to auscultation without rales, wheezing or rhonchi  ABDOMEN: Soft, non-tender, non-distended MUSCULOSKELETAL:  No edema; No deformity  SKIN: Warm and dry LOWER EXTREMITIES: no swelling NEUROLOGIC:  Alert and oriented x 3 PSYCHIATRIC:  Normal affect   ASSESSMENT:    1. Ischemic cardiomyopathy   2. Benign essential hypertension   3. Chronic combined systolic and diastolic congestive heart failure ([place])   4. Mixed hyperlipidemia    PLAN:    In order of problems listed above:  1. Ischemic cardiomyopathy gradual try put  on the right medications plan as outlined above we will check Chem-7 today 2. Benign essential hypertension blood pressure well controlled continue present management. 3. Chronic systolic and diastolic congestive heart failure New York Heart Association class II. 4. Mixed dyslipidemia.  We will continue with Crestor 40 for now.  Overall she clinically improved dramatically and she feels dramatically better I spoke to her daughter as well on the phone today I asked her to make sure that her mom follow-up advices and also make sure that she take her sugar on regular basis.   Medication Adjustments/Labs and Tests Ordered: Current medicines are reviewed at length with the  patient today.  Concerns regarding medicines are outlined above.  No orders of the defined types were placed in this encounter.  Medication changes: No orders of the defined types were placed in this encounter.   Signed, Park Liter, MD, [facility] 08/15/2019 3:42 PM    [place]

## 2019-08-15 NOTE — Patient Instructions (Signed)
Medication Instructions:  Your physician has recommended you make the following change in your medication:   STOP: Atenolol  START: Carvedilol 6.25 mg twice daily   *If you need a refill on your cardiac medications before your next appointment, please call your pharmacy*  Lab Work: Your physician recommends that you return for lab work today: bmp   If you have labs (blood work) drawn today and your tests are completely normal, you will receive your results only by: Marland Kitchen MyChart Message (if you have MyChart) OR . A paper copy in the mail If you have any lab test that is abnormal or we need to change your treatment, we will call you to review the results.  Testing/Procedures: None.   Follow-Up: At [facility], you and your health needs are our priority.  As part of our continuing mission to provide you with exceptional heart care, we have created designated Provider Care Teams.  These Care Teams include your primary Cardiologist (physician) and Advanced Practice Providers (APPs -  Physician Assistants and Nurse Practitioners) who all work together to provide you with the care you need, when you need it.  Your next appointment:   1 month(s)  The format for your next appointment:   In Person  Provider:   [name], MD  Other Instructions  Carvedilol Tablets What is this medicine? CARVEDILOL (KAR ve dil ol) is a beta blocker. It decreases the amount of work your heart has to do and helps your heart beat regularly. It treats high blood pressure. This medicine may be used for other purposes; ask your health care provider or pharmacist if you have questions. COMMON BRAND NAME(S): Coreg What should I tell my health care provider before I take this medicine? They need to know if you have any of these conditions:  circulation problems  diabetes  history of heart attack or heart disease  liver disease  lung or breathing disease, like asthma or  emphysema  pheochromocytoma  slow or irregular heartbeat  thyroid disease  an unusual or allergic reaction to carvedilol, other beta-blockers, medicines, foods, dyes, or preservatives  pregnant or trying to get pregnant  breast-feeding How should I use this medicine? Take this drug by mouth. Take it as directed on the prescription label at the same time every day. Take it with food. Keep taking it unless your health care provider tells you to stop. Talk to your health care provider about the use of this drug in children. Special care may be needed. Overdosage: If you think you have taken too much of this medicine contact a poison control center or emergency room at once. NOTE: This medicine is only for you. Do not share this medicine with others. What if I miss a dose? If you miss a dose, take it as soon as you can. If it is almost time for your next dose, take only that dose. Do not take double or extra doses. What may interact with this medicine? This medicine may interact with the following medications:  certain medicines for blood pressure, heart disease, irregular heart beat  certain medicines for depression, like fluoxetine or paroxetine  certain medicines for diabetes, like glipizide or glyburide  cimetidine  clonidine  cyclosporine  digoxin  MAOIs like Carbex, Eldepryl, Marplan, Nardil, and Parnate  reserpine  rifampin This list may not describe all possible interactions. Give your health care provider a list of all the medicines, herbs, non-prescription drugs, or dietary supplements you use. Also tell them if you  smoke, drink alcohol, or use illegal drugs. Some items may interact with your medicine. What should I watch for while using this medicine? Check your heart rate and blood pressure regularly while you are taking this medicine. Ask your doctor or health care professional what your heart rate and blood pressure should be, and when you should contact him or  her. Do not stop taking this medicine suddenly. This could lead to serious heart-related effects. Contact your doctor or health care professional if you have difficulty breathing while taking this drug. Check your weight daily. Ask your doctor or health care professional when you should notify him/her of any weight gain. You may get drowsy or dizzy. Do not drive, use machinery, or do anything that requires mental alertness until you know how this medicine affects you. To reduce the risk of dizzy or fainting spells, do not sit or stand up quickly. Alcohol can make you more drowsy, and increase flushing and rapid heartbeats. Avoid alcoholic drinks. This medicine may increase blood sugar. Ask your healthcare provider if changes in diet or medicines are needed if you have diabetes. If you are going to have surgery, tell your doctor or health care professional that you are taking this medicine. What side effects may I notice from receiving this medicine? Side effects that you should report to your doctor or health care professional as soon as possible:  allergic reactions like skin rash, itching or hives, swelling of the face, lips, or tongue  breathing problems  dark urine  irregular heartbeat   signs and symptoms of high blood sugar such as being more thirsty or hungry or having to urinate more than normal. You may also feel very tired or have blurry vision.  swollen legs or ankles  vomiting  yellowing of the eyes or skin Side effects that usually do not require medical attention (report to your doctor or health care professional if they continue or are bothersome):  change in sex drive or performance  diarrhea  dry eyes (especially if wearing contact lenses)  dry, itching skin  headache  nausea  unusually tired This list may not describe all possible side effects. Call your doctor for medical advice about side effects. You may report side effects to FDA at 1-800-FDA-1088. Where  should I keep my medicine? Keep out of the reach of children and pets. Store at room temperature between 20 and 25 degrees C (68 and 77 degrees F). Protect from moisture. Keep the container tightly closed. Throw away any unused drug after the expiration date. NOTE: This sheet is a summary. It may not cover all possible information. If you have questions about this medicine, talk to your doctor, pharmacist, or health care provider.  2020 Elsevier/Gold Standard (2019-02-22 17:42:09)

## 2019-08-16 ENCOUNTER — Telehealth: Payer: Self-pay | Admitting: Emergency Medicine

## 2019-08-16 LAB — BASIC METABOLIC PANEL
BUN/Creatinine Ratio: 28 (ref 12–28)
BUN: 25 mg/dL (ref 8–27)
CO2: 21 mmol/L (ref 20–29)
Calcium: 9.5 mg/dL (ref 8.7–10.3)
Chloride: 94 mmol/L — ABNORMAL LOW (ref 96–106)
Creatinine, Ser: 0.89 mg/dL (ref 0.57–1.00)
GFR calc Af Amer: 78 mL/min/{1.73_m2} (ref >59)
GFR calc non Af Amer: 67 mL/min/{1.73_m2} (ref >59)
Glucose: 590 mg/dL (ref 65–99)
Potassium: 4.8 mmol/L (ref 3.5–5.2)
Sodium: 132 mmol/L — ABNORMAL LOW (ref 134–144)

## 2019-08-16 NOTE — Telephone Encounter (Signed)
Called patient informed her of glucose of 590 that was called in by [facility]. I advised her per Dr. Agustin Cree to follow up with endocrinologist as soon as she could she verbally understood no further questions.

## 2019-08-22 ENCOUNTER — Telehealth: Payer: Self-pay

## 2019-08-22 NOTE — Telephone Encounter (Signed)
Called patient to inform of lab results. Mailbox was full and unable to lvm.

## 2019-09-18 ENCOUNTER — Ambulatory Visit: Payer: Medicare Other | Admitting: Cardiology

## 2019-10-08 ENCOUNTER — Other Ambulatory Visit: Payer: Self-pay | Admitting: Cardiology

## 2019-10-08 ENCOUNTER — Ambulatory Visit: Payer: Medicare Other | Admitting: Cardiology

## 2019-10-24 ENCOUNTER — Telehealth: Payer: Self-pay | Admitting: Cardiology

## 2019-10-24 NOTE — Telephone Encounter (Signed)
New Message  Amber is calling in to see if patient is able to get assistance with getting a handicap placard. Also patient is shaking and having issues with her nerves since the passing of her husband a month ago. Amber would like to speak with Dr. Raliegh Ip or his nurse about this. Please give Amber a call back.

## 2019-10-24 NOTE — Telephone Encounter (Signed)
Left message for Alexandra Baker to return call.

## 2019-10-29 ENCOUNTER — Telehealth: Payer: Self-pay | Admitting: Cardiology

## 2019-10-29 NOTE — Telephone Encounter (Signed)
Amber Trippett called back returning Hayley's call

## 2019-10-29 NOTE — Telephone Encounter (Signed)
Left message for amber to return call.

## 2019-10-29 NOTE — Telephone Encounter (Signed)
Left message for Alexandra Baker to return call.

## 2019-10-29 NOTE — Telephone Encounter (Signed)
Called amber back informed her to make sure patient comes to next appointment she verbally understood no further questions.

## 2019-10-29 NOTE — Telephone Encounter (Signed)
Please see additional encounter.

## 2019-10-29 NOTE — Telephone Encounter (Signed)
New Message    Pts daughter is calling and is asking for a Handicap card for the pt because she is having walking issues and SOB    Please advise

## 2019-10-29 NOTE — Telephone Encounter (Signed)
Pt is wanting a handicap placard Will forward to [employer] Rn to address./cy

## 2019-10-29 NOTE — Telephone Encounter (Signed)
Normal, nothing in addition.  Lets make sure we see her on next appointment in April

## 2019-10-29 NOTE — Telephone Encounter (Signed)
Amber per patients dpr called. Informed her that they will need to get the [facility] form for handicapped placard we can sign and they take back to [facility] for them to issue it. She verbally understood. She reports the patient had covid beginning of February 2021 and can't seem to get her energy back since that and gets short of breath when walking around the house. Amber has reached out to pcp about this and will be scheduling the patient an appointment with them. Will consult with Dr. Agustin Cree to see if he advises anything right now. Patient has upcoming appointment with Dr. Agustin Cree on April 7th 2021.

## 2019-11-06 ENCOUNTER — Other Ambulatory Visit: Payer: Self-pay

## 2019-11-06 ENCOUNTER — Ambulatory Visit (INDEPENDENT_AMBULATORY_CARE_PROVIDER_SITE_OTHER): Payer: Medicare Other | Admitting: Cardiology

## 2019-11-06 VITALS — BP 128/88 | HR 68 | Temp 97.0°F | Ht <= 58 in | Wt 161.0 lb

## 2019-11-06 DIAGNOSIS — I10 Essential (primary) hypertension: Secondary | ICD-10-CM

## 2019-11-06 DIAGNOSIS — I5042 Chronic combined systolic (congestive) and diastolic (congestive) heart failure: Secondary | ICD-10-CM | POA: Diagnosis not present

## 2019-11-06 DIAGNOSIS — I255 Ischemic cardiomyopathy: Secondary | ICD-10-CM | POA: Diagnosis not present

## 2019-11-06 DIAGNOSIS — Z951 Presence of aortocoronary bypass graft: Secondary | ICD-10-CM

## 2019-11-06 NOTE — Progress Notes (Signed)
Cardiology Office Note:    Date:  11/06/2019   ID:  Alexandra Baker, DOB [birth year], MRN [id number]  PCP:  Alma Friendly, MD  Cardiologist:  [name], MD    Referring MD: Alma Friendly, MD   No chief complaint on file. M doing poorly  History of Present Illness:    Alexandra Baker is a 69 y.o. female with past medical history significant for coronary artery disease, status post coronary bypass grafting in 1999 secondary to myocardial infarction at [facility] hospital, also history of diabetes which is poorly controlled, dyslipidemia, hypertension.  Recently she was diagnosed with congestive heart with significantly reduced left ventricle ejection fraction.  Gradually, trying to put her on right medications.  She comes today to my office for follow-up however tragedies trying to her husband recently died because complication of Covid 85.  She is currently very shaken by that which is absolutely understandable.  Past Medical History:  Diagnosis Date  . Benign essential hypertension 05/11/2016  . Cardiomyopathy ([place]) ejection fraction 4045% in November 2020 07/18/2019  . Cellulitis of left lower extremity 05/29/2019  . Congestive heart failure ([place]) New York Heart Association class III 07/18/2019  . Coronary disease 07/18/2019  . GAD (generalized anxiety disorder) 07/18/2019  . GERD (gastroesophageal reflux disease) 07/18/2019  . Heme positive stool 06/20/2018   Refuses colonoscopy  . Hyperlipidemia 05/11/2016  . Hypertension 07/18/2019  . Medically noncompliant 09/12/2018  . Memory loss 06/20/2018   5/5 mini cog refuses neuro eval Since heart surgery 11/19  . Mild intermittent asthma without complication 38/46/6599   Managed by dr. Alcide Clever  . Status post coronary artery bypass graft 1999 done at [facility] 07/18/2019  . Tremor 06/20/2018   Declines neuro eval  . Uncontrolled type 2 diabetes mellitus with hyperglycemia ([place]) 05/11/2016  .  Uncontrolled type 2 diabetes mellitus with microalbuminuria, with long-term current use of insulin ([place]) 07/18/2019   Managed by dr. Tamala Julian endo exclusively  Pt noncompliant with meds and ov    Past Surgical History:  Procedure Laterality Date  . CARPAL TUNNEL RELEASE    . CESAREAN SECTION    . CHOLECYSTECTOMY    . CORONARY ARTERY BYPASS GRAFT    . DILATION AND CURETTAGE OF UTERUS    . WRIST FRACTURE SURGERY      Current Medications: Current Meds  Medication Sig  . albuterol (VENTOLIN HFA) 108 (90 Base) MCG/ACT inhaler Inhale 2 puffs into the lungs every 6 (six) hours as needed.  . Aspirin Buf,CaCarb-MgCarb-MgO, 81 MG TABS Take 1 tablet by mouth daily.  . budesonide-formoterol (SYMBICORT) 160-4.5 MCG/ACT inhaler Inhale 1 puff into the lungs daily.   . carvedilol (COREG) 6.25 MG tablet TAKE ONE (1) TABLET BY MOUTH TWO (2) TIMES DAILY  . furosemide (LASIX) 40 MG tablet Take 1.5 tablets (60 mg total) by mouth daily.  . Insulin Glargine, 1 Unit Dial, (TOUJEO SOLOSTAR) 300 UNIT/ML SOPN Inject 40 Units into the skin daily.  . insulin lispro (HUMALOG) 100 UNIT/ML KwikPen Inject 10 Units into the skin. With each meal  . loratadine (CLARITIN) 10 MG tablet Take 1 mg by mouth daily.  . Multiple Vitamin (MULTIVITAMIN) capsule Take 1 capsule by mouth daily.  . nitroGLYCERIN (NITROLINGUAL) 0.4 MG/SPRAY spray Place 1 spray under the tongue as needed for chest pain.  Marland Kitchen omeprazole (PRILOSEC) 40 MG capsule Take 1 capsule by mouth daily.  . potassium chloride (KLOR-CON) 10 MEQ tablet TAKE ONE (1) TABLET BY MOUTH EVERY DAY  . rosuvastatin (  CRESTOR) 40 MG tablet Take 40 mg by mouth daily.     Allergies:   Tea, Ezetimibe-simvastatin, and Pitocin [oxytocin]   Social History   Socioeconomic History  . Marital status: Married    Spouse name: Not on file  . Number of children: Not on file  . Years of education: Not on file  . Highest education level: Not on file  Occupational History  . Not on file    Tobacco Use  . Smoking status: Never Smoker  . Smokeless tobacco: Never Used  Substance and Sexual Activity  . Alcohol use: Never  . Drug use: Never  . Sexual activity: Not on file  Other Topics Concern  . Not on file  Social History Narrative  . Not on file   Social Determinants of Health   Financial Resource Strain:   . Difficulty of Paying Living Expenses:   Food Insecurity:   . Worried About Charity fundraiser in the Last Year:   . Arboriculturist in the Last Year:   Transportation Needs:   . Film/video editor (Medical):   Marland Kitchen Lack of Transportation (Non-Medical):   Physical Activity:   . Days of Exercise per Week:   . Minutes of Exercise per Session:   Stress:   . Feeling of Stress :   Social Connections:   . Frequency of Communication with Friends and Family:   . Frequency of Social Gatherings with Friends and Family:   . Attends Religious Services:   . Active Member of Clubs or Organizations:   . Attends Archivist Meetings:   Marland Kitchen Marital Status:      Family History: [name]; Aneurysm in her father; Breast cancer in her mother; Diabetes in her sister, sister, sister, and sister; Hypertension in her mother; Kidney failure in her mother. ROS:   Please see the history of present illness.    All 14 point review of systems negative except as described per history of present illness  EKGs/Labs/Other Studies Reviewed:      Recent Labs: 07/25/2019: NT-Pro BNP 3,290 08/15/2019: BUN 25; Creatinine, Ser 0.89; Potassium 4.8; Sodium 132  Recent Lipid Panel No results found for: CHOL, TRIG, HDL, CHOLHDL, VLDL, LDLCALC, LDLDIRECT  Physical Exam:    VS:  BP 128/88   Pulse 68   Temp (!) 97 F (36.1 C)   Ht 4' 9.5" (1.461 m)   Wt 161 lb (73 kg)   SpO2 96%   BMI 34.24 kg/m     Wt Readings from Last 3 Encounters:  11/06/19 161 lb (73 kg)  08/15/19 151 lb (68.5 kg)  07/25/19 158 lb 9.6 oz  (71.9 kg)     GEN:  Well nourished, well developed in no acute distress HEENT: Normal NECK: No JVD; No carotid bruits LYMPHATICS: No lymphadenopathy CARDIAC: RRR, no murmurs, no rubs, no gallops RESPIRATORY:  Clear to auscultation without rales, wheezing or rhonchi  ABDOMEN: Soft, non-tender, non-distended MUSCULOSKELETAL:  No edema; No deformity  SKIN: Warm and dry LOWER EXTREMITIES: no swelling NEUROLOGIC:  Alert and oriented x 3 PSYCHIATRIC:  Normal affect   ASSESSMENT:    1. Status post coronary artery bypass graft 1999 done at [facility]   2. Ischemic cardiomyopathy   3. Chronic combined systolic and diastolic congestive heart failure ([place])   4. Benign essential hypertension    PLAN:    In order of problems listed above:  1. Status  post coronary bypass graft doing well from that point review of course assessment today somewhat difficult because of her crying a lot.  I will simply see her in the office early next month for 6 weeks to revisit dose issues. 2. Ischemic cardiomyopathy.  I will ask her to have Chem-7 done today and based on that we will decide how we can adjust her medication.  Obviously she can benefit from [place]. 3. Benign essential hypertension blood pressure controlled today we will continue present management.   Medication Adjustments/Labs and Tests Ordered: Current medicines are reviewed at length with the patient today.  Concerns regarding medicines are outlined above.  Orders Placed This Encounter  Procedures  . Basic metabolic panel  . Pro b natriuretic peptide (BNP)  . EKG 12-Lead   Medication changes: No orders of the defined types were placed in this encounter.   Signed, Park Liter, MD, [facility] 11/06/2019 4:56 PM    [place]

## 2019-11-06 NOTE — Patient Instructions (Signed)
Medication Instructions:  Your physician recommends that you continue on your current medications as directed. Please refer to the Current Medication list given to you today.  *If you need a refill on your cardiac medications before your next appointment, please call your pharmacy*   Lab Work: Your physician recommends that you return for lab work today: bmp, pro bnp   If you have labs (blood work) drawn today and your tests are completely normal, you will receive your results only by: Marland Kitchen MyChart Message (if you have MyChart) OR . A paper copy in the mail If you have any lab test that is abnormal or we need to change your treatment, we will call you to review the results.   Testing/Procedures: None.    Follow-Up: At [facility], you and your health needs are our priority.  As part of our continuing mission to provide you with exceptional heart care, we have created designated Provider Care Teams.  These Care Teams include your primary Cardiologist (physician) and Advanced Practice Providers (APPs -  Physician Assistants and Nurse Practitioners) who all work together to provide you with the care you need, when you need it.  We recommend signing up for the patient portal called "MyChart".  Sign up information is provided on this After Visit Summary.  MyChart is used to connect with patients for Virtual Visits (Telemedicine).  Patients are able to view lab/test results, encounter notes, upcoming appointments, etc.  Non-urgent messages can be sent to your provider as well.   To learn more about what you can do with MyChart, go to [email or web address].    Your next appointment:   6 week(s)  The format for your next appointment:   In Person  Provider:   [name], MD   Other Instructions

## 2019-11-07 LAB — BASIC METABOLIC PANEL
BUN/Creatinine Ratio: 31 — ABNORMAL HIGH (ref 12–28)
BUN: 32 mg/dL — ABNORMAL HIGH (ref 8–27)
CO2: 22 mmol/L (ref 20–29)
Calcium: 9 mg/dL (ref 8.7–10.3)
Chloride: 105 mmol/L (ref 96–106)
Creatinine, Ser: 1.02 mg/dL — ABNORMAL HIGH (ref 0.57–1.00)
GFR calc Af Amer: 66 mL/min/{1.73_m2} (ref >59)
GFR calc non Af Amer: 57 mL/min/{1.73_m2} — ABNORMAL LOW (ref >59)
Glucose: 186 mg/dL — ABNORMAL HIGH (ref 65–99)
Potassium: 4.5 mmol/L (ref 3.5–5.2)
Sodium: 141 mmol/L (ref 134–144)

## 2019-11-07 LAB — PRO B NATRIURETIC PEPTIDE: NT-Pro BNP: 2973 pg/mL — ABNORMAL HIGH (ref 0–301)

## 2019-11-14 ENCOUNTER — Telehealth: Payer: Self-pay | Admitting: Cardiology

## 2019-11-14 NOTE — Telephone Encounter (Signed)
Left message for patient to call back  

## 2019-11-14 NOTE — Telephone Encounter (Signed)
I spoke with patient's daughter who reports patient has swelling in legs and ankles. She feels it has worsened since office visit last week.  Patient does not weigh daily. Watches salt intake but does occasionally eat take out food. Patient has limited activity due to shortness of breath. Daughter thinks shortness of breath has worsened in last week.  I asked daughter to have patient weigh daily every morning and keep record of weights. Chart indicates patient is taking lasix 60 mg daily. Daughter is not sure  what dose patient is taking. She reports patient did take extra lasix recently due to swelling.  Patient's daughter will check with patient and confirm dose of lasix patient is taking. She will call back with this information

## 2019-11-14 NOTE — Telephone Encounter (Signed)
Follow up   Patient is returning your call for results. Please call.

## 2019-11-14 NOTE — Telephone Encounter (Signed)
Pt c/o swelling: STAT is pt has developed SOB within 24 hours  1) How much weight have you gained and in what time span? Patient's daughter believes so but is unsure, patient has not been weighing herself  2) If swelling, where is the swelling located? Feet and legs  3) Are you currently taking a fluid pill? yes  4) Are you currently SOB? no  5) Do you have a log of your daily weights (if so, list)? no  6) Have you gained 3 pounds in a day or 5 pounds in a week? no  7) Have you traveled recently? no  Patient's daughter states the patient's feet and legs are very swollen and is SOB when moving around. She states the patient has been going to the bathroom a lot as well. She would like to discuss whether her fluid pill should be increased. Please advise.

## 2019-11-18 NOTE — Telephone Encounter (Signed)
Left message for patient's daughter to call back to follow-up on call from last week.

## 2019-11-19 NOTE — Telephone Encounter (Signed)
Left message for pt dtr to call  

## 2019-12-25 DIAGNOSIS — E785 Hyperlipidemia, unspecified: Secondary | ICD-10-CM

## 2019-12-25 HISTORY — DX: Hyperlipidemia, unspecified: E78.5

## 2019-12-27 ENCOUNTER — Encounter: Payer: Self-pay | Admitting: Cardiology

## 2019-12-27 ENCOUNTER — Telehealth: Payer: Self-pay | Admitting: Emergency Medicine

## 2019-12-27 ENCOUNTER — Other Ambulatory Visit: Payer: Self-pay

## 2019-12-27 ENCOUNTER — Ambulatory Visit (INDEPENDENT_AMBULATORY_CARE_PROVIDER_SITE_OTHER): Payer: Medicare Other | Admitting: Cardiology

## 2019-12-27 VITALS — BP 124/76 | HR 70 | Ht <= 58 in | Wt 148.2 lb

## 2019-12-27 DIAGNOSIS — Z951 Presence of aortocoronary bypass graft: Secondary | ICD-10-CM | POA: Diagnosis not present

## 2019-12-27 DIAGNOSIS — I5042 Chronic combined systolic (congestive) and diastolic (congestive) heart failure: Secondary | ICD-10-CM

## 2019-12-27 DIAGNOSIS — E782 Mixed hyperlipidemia: Secondary | ICD-10-CM

## 2019-12-27 DIAGNOSIS — I255 Ischemic cardiomyopathy: Secondary | ICD-10-CM

## 2019-12-27 DIAGNOSIS — I10 Essential (primary) hypertension: Secondary | ICD-10-CM | POA: Diagnosis not present

## 2019-12-27 MED ORDER — ENTRESTO 24-26 MG PO TABS: 1.0000 | ORAL_TABLET | Freq: Two times a day (BID) | ORAL | 2 refills | Status: DC

## 2019-12-27 NOTE — Patient Instructions (Addendum)
Medication Instructions:  Your physician has recommended you make the following change in your medication:  START: Entresto 24/26 mg twice daily   STOP: Potassium  *If you need a refill on your cardiac medications before your next appointment, please call your pharmacy*   Lab Work: Your physician recommends that you return for lab work today: bmp and 1 week: bmp   If you have labs (blood work) drawn today and your tests are completely normal, you will receive your results only by: Marland Kitchen MyChart Message (if you have MyChart) OR . A paper copy in the mail If you have any lab test that is abnormal or we need to change your treatment, we will call you to review the results.   Testing/Procedures: None.    Follow-Up: At [facility], you and your health needs are our priority.  As part of our continuing mission to provide you with exceptional heart care, we have created designated Provider Care Teams.  These Care Teams include your primary Cardiologist (physician) and Advanced Practice Providers (APPs -  Physician Assistants and Nurse Practitioners) who all work together to provide you with the care you need, when you need it.  We recommend signing up for the patient portal called "MyChart".  Sign up information is provided on this After Visit Summary.  MyChart is used to connect with patients for Virtual Visits (Telemedicine).  Patients are able to view lab/test results, encounter notes, upcoming appointments, etc.  Non-urgent messages can be sent to your provider as well.   To learn more about what you can do with MyChart, go to [email or web address].    Your next appointment:   1 month(s)  The format for your next appointment:   In Person  Provider:   [name], MD   Other Instructions  Sacubitril; Valsartan Oral Tablets What is this medicine? SACUBITRIL; VALSARTAN (sak UE bi tril; val SAR tan) is a combination of a neprilysin inhibitor and a an angiotensin II receptor  blocker. It treats heart failure. This medicine may be used for other purposes; ask your health care provider or pharmacist if you have questions. COMMON BRAND NAME(S): Entresto What should I tell my health care provider before I take this medicine? They need to know if you have any of these conditions:  diabetes and take a medicine that contains aliskiren  kidney disease  liver disease  an unusual or allergic reaction to sacubitril; valsartan, drugs called angiotensin converting enzyme (ACE) inhibitors, angiotensin II receptor blockers (ARBs), other medicines, foods, dyes, or preservatives  pregnant or trying to get pregnant  breast-feeding How should I use this medicine? Take this drug by mouth. Take it as directed on the prescription label at the same time every day. You can take it with or without food. If it upsets your stomach, take it with food. Keep taking it unless your health care provider tells you to stop. Talk to your health care provider about the use of this drug in children. While it may be prescribed for children as young as 1 for selected conditions, precautions do apply. Overdosage: If you think you have taken too much of this medicine contact a poison control center or emergency room at once. NOTE: This medicine is only for you. Do not share this medicine with others. What if I miss a dose? If you miss a dose, take it as soon as you can. If it is almost time for your next dose, take only that dose. Do not take double or extra  doses. What may interact with this medicine? Do not take this medicine with any of the following medicines:  aliskiren if you have diabetes  angiotensin-converting enzyme (ACE) inhibitors, like benazepril, captopril, enalapril, fosinopril, lisinopril, or ramipril This medicine may also interact with the following medicines:  angiotensin II receptor blockers (ARBs) like azilsartan, candesartan, eprosartan, irbesartan, losartan, olmesartan,  telmisartan, or valsartan  lithium  NSAIDS, medicines for pain and inflammation, like ibuprofen or naproxen  potassium-sparing diuretics like amiloride, spironolactone, and triamterene  potassium supplements This list may not describe all possible interactions. Give your health care provider a list of all the medicines, herbs, non-prescription drugs, or dietary supplements you use. Also tell them if you smoke, drink alcohol, or use illegal drugs. Some items may interact with your medicine. What should I watch for while using this medicine? Tell your doctor or healthcare professional if your symptoms do not start to get better or if they get worse. Do not become pregnant while taking this medicine. Women should inform their doctor if they wish to become pregnant or think they might be pregnant. There is a potential for serious side effects to an unborn child. Talk to your health care professional or pharmacist for more information. You may get dizzy. Do not drive, use machinery, or do anything that needs mental alertness until you know how this medicine affects you. Do not stand or sit up quickly, especially if you are an older patient. This reduces the risk of dizzy or fainting spells. Avoid alcoholic drinks; they can make you more dizzy. What side effects may I notice from receiving this medicine? Side effects that you should report to your doctor or health care professional as soon as possible:  allergic reactions like skin rash, itching or hives, swelling of the face, lips, or tongue  signs and symptoms of increased potassium like muscle weakness; chest pain; or fast, irregular heartbeat  signs and symptoms of kidney injury like trouble passing urine or change in the amount of urine  signs and symptoms of low blood pressure like feeling dizzy or lightheaded, or if you develop extreme fatigue Side effects that usually do not require medical attention (report to your doctor or health care  professional if they continue or are bothersome):  cough This list may not describe all possible side effects. Call your doctor for medical advice about side effects. You may report side effects to FDA at 1-800-FDA-1088. Where should I keep my medicine? Keep out of the reach of children and pets. Store at room temperature between 20 and 25 degrees C (68 and 77 degrees F). Protect from moisture. Keep the container tightly closed. Throw away any unused drug after the expiration date. NOTE: This sheet is a summary. It may not cover all possible information. If you have questions about this medicine, talk to your doctor, pharmacist, or health care provider.  2020 Elsevier/Gold Standard (2019-02-20 16:03:07)

## 2019-12-27 NOTE — Progress Notes (Signed)
Cardiology Office Note:    Date:  12/27/2019   ID:  Alexandra Baker, DOB 04/26/1951, MRN [id number]  PCP:  Alma Friendly, MD  Cardiologist:  [name], MD    Referring MD: Alma Friendly, MD   Chief Complaint  Patient presents with  . Follow-up    6 WK FU   Doing better  History of Present Illness:    Alexandra Baker is a 69 y.o. female with past medical history significant for coronary artery disease, status post coronary bypass graft done in 1999 at [facility] hospital. Recently she is being in the hospital she was diagnosed with cardiomyopathy ejection fraction 40 to 45%. Appropriate medication has been initiated however after that tragedy strikes, her husband died because of COVID-33. Last time I have seen her she was very emotional and honestly was very difficult to talk to her at that time now she is much better overall complain of having some swelling of lower extremities still fatigue and tiredness but overall seems to be doing better. No chest pain no tightness no squeezing no pressure no burning in the chest.  Past Medical History:  Diagnosis Date  . Benign essential hypertension 05/11/2016  . Cardiomyopathy ([place]) ejection fraction 4045% in November 2020 07/18/2019  . Cellulitis of left lower extremity 05/29/2019  . Congestive heart failure ([employer]) New York Heart Association class III 07/18/2019  . Coronary disease 07/18/2019  . GAD (generalized anxiety disorder) 07/18/2019  . GERD (gastroesophageal reflux disease) 07/18/2019  . Heme positive stool 06/20/2018   Refuses colonoscopy  . Hyperlipidemia 05/11/2016  . Hypertension 07/18/2019  . Medically noncompliant 09/12/2018  . Memory loss 06/20/2018   5/5 mini cog refuses neuro eval Since heart surgery 11/19  . Mild intermittent asthma without complication 83/15/1761   Managed by dr. Alcide Clever  . Status post coronary artery bypass graft 1999 done at [facility] 07/18/2019  . Tremor  06/20/2018   Declines neuro eval  . Uncontrolled type 2 diabetes mellitus with hyperglycemia ([place]) 05/11/2016  . Uncontrolled type 2 diabetes mellitus with microalbuminuria, with long-term current use of insulin ([place]) 07/18/2019   Managed by dr. Tamala Julian endo exclusively  Pt noncompliant with meds and ov    Past Surgical History:  Procedure Laterality Date  . CARPAL TUNNEL RELEASE    . CESAREAN SECTION    . CHOLECYSTECTOMY    . CORONARY ARTERY BYPASS GRAFT    . DILATION AND CURETTAGE OF UTERUS    . WRIST FRACTURE SURGERY      Current Medications: Current Meds  Medication Sig  . albuterol (VENTOLIN HFA) 108 (90 Base) MCG/ACT inhaler Inhale 2 puffs into the lungs every 6 (six) hours as needed.  . Aspirin Buf,CaCarb-MgCarb-MgO, 81 MG TABS Take 1 tablet by mouth daily.  . budesonide-formoterol (SYMBICORT) 160-4.5 MCG/ACT inhaler Inhale 1 puff into the lungs daily.   . carvedilol (COREG) 6.25 MG tablet TAKE ONE (1) TABLET BY MOUTH TWO (2) TIMES DAILY  . citalopram (CELEXA) 20 MG tablet Take 20 mg by mouth as needed.   . furosemide (LASIX) 40 MG tablet Take 1.5 tablets (60 mg total) by mouth daily.  . Insulin Glargine, 1 Unit Dial, (TOUJEO SOLOSTAR) 300 UNIT/ML SOPN Inject 30 Units into the skin daily.   . insulin lispro (HUMALOG) 100 UNIT/ML KwikPen Inject 8 Units into the skin. With each meal  . loratadine (CLARITIN) 10 MG tablet Take 1 mg by mouth daily.  . Multiple Vitamin (MULTIVITAMIN) capsule Take 1 capsule by mouth daily.  Marland Kitchen  nitroGLYCERIN (NITROLINGUAL) 0.4 MG/SPRAY spray Place 1 spray under the tongue as needed for chest pain.  Marland Kitchen omeprazole (PRILOSEC) 40 MG capsule Take 1 capsule by mouth daily.  . potassium chloride (KLOR-CON) 10 MEQ tablet TAKE ONE (1) TABLET BY MOUTH EVERY DAY  . rosuvastatin (CRESTOR) 40 MG tablet Take 40 mg by mouth daily.     Allergies:   Tea, Ezetimibe-simvastatin, and Pitocin [oxytocin]   Social History   Socioeconomic History  . Marital status:  Married    Spouse name: Not on file  . Number of children: Not on file  . Years of education: Not on file  . Highest education level: Not on file  Occupational History  . Not on file  Tobacco Use  . Smoking status: Never Smoker  . Smokeless tobacco: Never Used  Substance and Sexual Activity  . Alcohol use: Never  . Drug use: Never  . Sexual activity: Not on file  Other Topics Concern  . Not on file  Social History Narrative  . Not on file   Social Determinants of Health   Financial Resource Strain:   . Difficulty of Paying Living Expenses:   Food Insecurity:   . Worried About Charity fundraiser in the Last Year:   . Arboriculturist in the Last Year:   Transportation Needs:   . Film/video editor (Medical):   Marland Kitchen Lack of Transportation (Non-Medical):   Physical Activity:   . Days of Exercise per Week:   . Minutes of Exercise per Session:   Stress:   . Feeling of Stress :   Social Connections:   . Frequency of Communication with Friends and Family:   . Frequency of Social Gatherings with Friends and Family:   . Attends Religious Services:   . Active Member of Clubs or Organizations:   . Attends Archivist Meetings:   Marland Kitchen Marital Status:      Family History: [name]; Aneurysm in her father; Breast cancer in her mother; Diabetes in her sister, sister, sister, and sister; Hypertension in her mother; Kidney failure in her mother. ROS:   Please see the history of present illness.    All 14 point review of systems negative except as described per history of present illness  EKGs/Labs/Other Studies Reviewed:      Recent Labs: 11/06/2019: BUN 32; Creatinine, Ser 1.02; NT-Pro BNP 2,973; Potassium 4.5; Sodium 141  Recent Lipid Panel No results found for: CHOL, TRIG, HDL, CHOLHDL, VLDL, LDLCALC, LDLDIRECT  Physical Exam:    VS:  BP 124/76   Pulse 70   Ht 4' 9.5" (1.461 m)   Wt 148 lb 3.2 oz  (67.2 kg)   SpO2 97%   BMI 31.51 kg/m     Wt Readings from Last 3 Encounters:  12/27/19 148 lb 3.2 oz (67.2 kg)  11/06/19 161 lb (73 kg)  08/15/19 151 lb (68.5 kg)     GEN:  Well nourished, well developed in no acute distress HEENT: Normal NECK: No JVD; No carotid bruits LYMPHATICS: No lymphadenopathy CARDIAC: RRR, no murmurs, no rubs, no gallops RESPIRATORY:  Clear to auscultation without rales, wheezing or rhonchi  ABDOMEN: Soft, non-tender, non-distended MUSCULOSKELETAL:  No edema; No deformity  SKIN: Warm and dry LOWER EXTREMITIES: no swelling NEUROLOGIC:  Alert and oriented x 3 PSYCHIATRIC:  Normal affect   ASSESSMENT:    1. Status post coronary artery bypass graft 1999 done at [facility]   2. Ischemic cardiomyopathy   3. Benign essential hypertension   4. Essential hypertension   5. Mixed hyperlipidemia    PLAN:    In order of problems listed above:  1. Status post coronary bypass graft: Doing well from that point review. Continue present management. 2. Ischemic cardiomyopathy we will give her prescription for Entresto 24/26. Will check Chem-7 today as well as Chem-7 next week. See her back in 1 month and see how she does in the meantime we will continue with Coreg. I will discontinue her potassium since her potassium last time was slightly on the high side. 3. Benign essential hypertension blood pressure seems to be well controlled continue present management. 4. Mixed dyslipidemia: She is on high intensity statin which I will continue. 5. I see her we will do fasting lipid profile.   Medication Adjustments/Labs and Tests Ordered: Current medicines are reviewed at length with the patient today.  Concerns regarding medicines are outlined above.  No orders of the defined types were placed in this encounter.  Medication changes: No orders of the defined types were placed in this encounter.   Signed, Park Liter, MD, [facility] 12/27/2019 2:58  PM    [place]

## 2019-12-27 NOTE — Telephone Encounter (Signed)
Patient called in and reports her pharmacy doesn't have any entresto on hand right now but they will order it next week. Dr. Agustin Cree aware patient advised not to stop potassium until she gets entresto and to come after 1 week of being on entresto for labs. No further questions.

## 2019-12-28 LAB — BASIC METABOLIC PANEL
BUN/Creatinine Ratio: 26 (ref 12–28)
BUN: 29 mg/dL — ABNORMAL HIGH (ref 8–27)
CO2: 24 mmol/L (ref 20–29)
Calcium: 9.3 mg/dL (ref 8.7–10.3)
Chloride: 96 mmol/L (ref 96–106)
Creatinine, Ser: 1.1 mg/dL — ABNORMAL HIGH (ref 0.57–1.00)
GFR calc Af Amer: 60 mL/min/{1.73_m2} (ref >59)
GFR calc non Af Amer: 52 mL/min/{1.73_m2} — ABNORMAL LOW (ref >59)
Glucose: 322 mg/dL — ABNORMAL HIGH (ref 65–99)
Potassium: 4.3 mmol/L (ref 3.5–5.2)
Sodium: 137 mmol/L (ref 134–144)

## 2019-12-28 LAB — PRO B NATRIURETIC PEPTIDE: NT-Pro BNP: 2515 pg/mL — ABNORMAL HIGH (ref 0–301)

## 2019-12-31 DIAGNOSIS — I255 Ischemic cardiomyopathy: Secondary | ICD-10-CM | POA: Insufficient documentation

## 2019-12-31 DIAGNOSIS — I5042 Chronic combined systolic (congestive) and diastolic (congestive) heart failure: Secondary | ICD-10-CM | POA: Insufficient documentation

## 2019-12-31 HISTORY — DX: Chronic combined systolic (congestive) and diastolic (congestive) heart failure: I50.42

## 2019-12-31 HISTORY — DX: Ischemic cardiomyopathy: I25.5

## 2020-01-01 ENCOUNTER — Telehealth: Payer: Self-pay | Admitting: Emergency Medicine

## 2020-01-01 DIAGNOSIS — I5042 Chronic combined systolic (congestive) and diastolic (congestive) heart failure: Secondary | ICD-10-CM

## 2020-01-01 NOTE — Telephone Encounter (Signed)
Called patient to give her lab results. During the call she reports that the pharmacy and pcp were concerned for her to take entresto due to her being on two beta blockers. The only beta blocker we had her on in our chart was carvedilol however she reports she also takes atenolol 50 mg daily. She wants to know if it is okay to still take entresto with both beta blockers. Will consult with Dr. Agustin Cree.

## 2020-01-03 NOTE — Telephone Encounter (Signed)
Left message for patient to return call.

## 2020-01-03 NOTE — Telephone Encounter (Signed)
Called patient informed her per Dr. Agustin Cree to only continue with carvedilol and discontinue atenolol patient verbally understood no further questions.

## 2020-01-03 NOTE — Telephone Encounter (Signed)
Now, he does not exercise to take to beta-blocker so she just carvedilol only.  Atenolol need to be discontinued

## 2020-01-03 NOTE — Telephone Encounter (Signed)
Patient returned your call.

## 2020-01-15 LAB — BASIC METABOLIC PANEL
BUN/Creatinine Ratio: 23 (ref 12–28)
BUN: 29 mg/dL — ABNORMAL HIGH (ref 8–27)
CO2: 22 mmol/L (ref 20–29)
Calcium: 9.5 mg/dL (ref 8.7–10.3)
Chloride: 94 mmol/L — ABNORMAL LOW (ref 96–106)
Creatinine, Ser: 1.24 mg/dL — ABNORMAL HIGH (ref 0.57–1.00)
GFR calc Af Amer: 52 mL/min/{1.73_m2} — ABNORMAL LOW (ref >59)
GFR calc non Af Amer: 45 mL/min/{1.73_m2} — ABNORMAL LOW (ref >59)
Glucose: 475 mg/dL — ABNORMAL HIGH (ref 65–99)
Potassium: 4.9 mmol/L (ref 3.5–5.2)
Sodium: 132 mmol/L — ABNORMAL LOW (ref 134–144)

## 2020-01-21 NOTE — Addendum Note (Signed)
Addended by: Ashok Norris on: 01/21/2020 03:42 PM   Modules accepted: Orders

## 2020-01-22 LAB — BASIC METABOLIC PANEL
BUN/Creatinine Ratio: 30 — ABNORMAL HIGH (ref 12–28)
BUN: 39 mg/dL — ABNORMAL HIGH (ref 8–27)
CO2: 23 mmol/L (ref 20–29)
Calcium: 9.3 mg/dL (ref 8.7–10.3)
Chloride: 97 mmol/L (ref 96–106)
Creatinine, Ser: 1.29 mg/dL — ABNORMAL HIGH (ref 0.57–1.00)
GFR calc Af Amer: 50 mL/min/{1.73_m2} — ABNORMAL LOW (ref >59)
GFR calc non Af Amer: 43 mL/min/{1.73_m2} — ABNORMAL LOW (ref >59)
Glucose: 492 mg/dL — ABNORMAL HIGH (ref 65–99)
Potassium: 4.5 mmol/L (ref 3.5–5.2)
Sodium: 134 mmol/L (ref 134–144)

## 2020-02-05 ENCOUNTER — Telehealth: Payer: Self-pay

## 2020-02-05 NOTE — Telephone Encounter (Signed)
Called and spoke with pt to notify her that BMET were not needed other than the initial start of Entresto and 1 week after. Pt states she was told to come weekly for BMET. Clarified by secure chat with Dr. Agustin Cree.

## 2020-02-13 ENCOUNTER — Encounter: Payer: Self-pay | Admitting: Cardiology

## 2020-02-13 ENCOUNTER — Other Ambulatory Visit: Payer: Self-pay

## 2020-02-13 ENCOUNTER — Ambulatory Visit: Payer: Medicare Other | Admitting: Cardiology

## 2020-02-13 VITALS — BP 130/80 | HR 78 | Ht <= 58 in | Wt 148.4 lb

## 2020-02-13 DIAGNOSIS — I5042 Chronic combined systolic (congestive) and diastolic (congestive) heart failure: Secondary | ICD-10-CM

## 2020-02-13 DIAGNOSIS — E782 Mixed hyperlipidemia: Secondary | ICD-10-CM | POA: Diagnosis not present

## 2020-02-13 DIAGNOSIS — I255 Ischemic cardiomyopathy: Secondary | ICD-10-CM | POA: Diagnosis not present

## 2020-02-13 DIAGNOSIS — I10 Essential (primary) hypertension: Secondary | ICD-10-CM | POA: Diagnosis not present

## 2020-02-13 MED ORDER — ROSUVASTATIN CALCIUM 40 MG PO TABS: 40.0000 mg | ORAL_TABLET | Freq: Every day | ORAL | 1 refills | Status: DC

## 2020-02-13 MED ORDER — ENTRESTO 49-51 MG PO TABS: 1.0000 | ORAL_TABLET | Freq: Two times a day (BID) | ORAL | 1 refills | Status: DC

## 2020-02-13 NOTE — Progress Notes (Signed)
Cardiology Office Note:    Date:  02/13/2020   ID:  Alexandra Baker, DOB 04/25/1951, MRN [id number]  PCP:  Alexandra Friendly, MD  Cardiologist:  Alexandra Campus, MD    Referring MD: Alexandra Friendly, MD   No chief complaint on file. Doing better  History of Present Illness:    Alexandra Baker is a 69 y.o. female   with past medical history significant for coronary artery disease, status post coronary bypass graft done in 1999 at [facility] hospital. Recently she is being in the hospital she was diagnosed with cardiomyopathy ejection fraction 40 to 45%.  She comes today to also follow-up overall doing better she lost some weight she is very happy with that.  She was able to tolerate Entresto with no difficulties.  Denies have any chest pain tightness squeezing pressure burning chest.  The purpose of the visit titration of her medications  Past Medical History:  Diagnosis Date  . Benign essential hypertension 05/11/2016  . Cardiomyopathy ([place]) ejection fraction 4045% in November 2020 07/18/2019  . Cellulitis of left lower extremity 05/29/2019  . Congestive heart failure ([place]) New York Heart Association class III 07/18/2019  . Coronary disease 07/18/2019  . GAD (generalized anxiety disorder) 07/18/2019  . GERD (gastroesophageal reflux disease) 07/18/2019  . Heme positive stool 06/20/2018   Refuses colonoscopy  . Hyperlipidemia 05/11/2016  . Hypertension 07/18/2019  . Medically noncompliant 09/12/2018  . Memory loss 06/20/2018   5/5 mini cog refuses neuro eval Since heart surgery 11/19  . Mild intermittent asthma without complication 29/93/7169   Managed by dr. Alcide Clever  . Status post coronary artery bypass graft 1999 done at [facility] 07/18/2019  . Tremor 06/20/2018   Declines neuro eval  . Uncontrolled type 2 diabetes mellitus with hyperglycemia ([place]) 05/11/2016  . Uncontrolled type 2 diabetes mellitus with microalbuminuria, with long-term current use  of insulin ([place]) 07/18/2019   Managed by dr. Tamala Julian endo exclusively  Pt noncompliant with meds and ov    Past Surgical History:  Procedure Laterality Date  . CARPAL TUNNEL RELEASE    . CESAREAN SECTION    . CHOLECYSTECTOMY    . CORONARY ARTERY BYPASS GRAFT    . DILATION AND CURETTAGE OF UTERUS    . WRIST FRACTURE SURGERY      Current Medications: Current Meds  Medication Sig  . albuterol (VENTOLIN HFA) 108 (90 Base) MCG/ACT inhaler Inhale 2 puffs into the lungs every 6 (six) hours as needed.  . Aspirin Buf,CaCarb-MgCarb-MgO, 81 MG TABS Take 1 tablet by mouth daily.  . budesonide-formoterol (SYMBICORT) 160-4.5 MCG/ACT inhaler Inhale 1 puff into the lungs daily.   . carvedilol (COREG) 6.25 MG tablet TAKE ONE (1) TABLET BY MOUTH TWO (2) TIMES DAILY  . citalopram (CELEXA) 20 MG tablet Take 20 mg by mouth as needed.   . furosemide (LASIX) 40 MG tablet Take 1.5 tablets (60 mg total) by mouth daily.  . hydrOXYzine (VISTARIL) 25 MG capsule Take 25 mg by mouth 3 (three) times daily.  . Insulin Glargine, 1 Unit Dial, (TOUJEO SOLOSTAR) 300 UNIT/ML SOPN Inject 30 Units into the skin daily.   . insulin lispro (HUMALOG) 100 UNIT/ML KwikPen Inject 8 Units into the skin. With each meal  . loratadine (CLARITIN) 10 MG tablet Take 1 mg by mouth daily.  . Multiple Vitamin (MULTIVITAMIN) capsule Take 1 capsule by mouth daily.  . nitroGLYCERIN (NITROLINGUAL) 0.4 MG/SPRAY spray Place 1 spray under the tongue as needed for chest pain.  Marland Kitchen  omeprazole (PRILOSEC) 40 MG capsule Take 1 capsule by mouth daily.  . rosuvastatin (CRESTOR) 40 MG tablet Take 40 mg by mouth daily.  . sacubitril-valsartan (ENTRESTO) 24-26 MG Take 1 tablet by mouth 2 (two) times daily.  . vitamin B-12 (CYANOCOBALAMIN) 1000 MCG tablet Take 1,000 mcg by mouth daily.     Allergies:   Tea, Ezetimibe-simvastatin, and Pitocin [oxytocin]   Social History   Socioeconomic History  . Marital status: Married    Spouse name: Not on file  .  Number of children: Not on file  . Years of education: Not on file  . Highest education level: Not on file  Occupational History  . Not on file  Tobacco Use  . Smoking status: Never Smoker  . Smokeless tobacco: Never Used  Vaping Use  . Vaping Use: Never used  Substance and Sexual Activity  . Alcohol use: Never  . Drug use: Never  . Sexual activity: Not on file  Other Topics Concern  . Not on file  Social History Narrative  . Not on file   Social Determinants of Health   Financial Resource Strain:   . Difficulty of Paying Living Expenses:   Food Insecurity:   . Worried About Charity fundraiser in the Last Year:   . Arboriculturist in the Last Year:   Transportation Needs:   . Film/video editor (Medical):   Marland Kitchen Lack of Transportation (Non-Medical):   Physical Activity:   . Days of Exercise per Week:   . Minutes of Exercise per Session:   Stress:   . Feeling of Stress :   Social Connections:   . Frequency of Communication with Friends and Family:   . Frequency of Social Gatherings with Friends and Family:   . Attends Religious Services:   . Active Member of Clubs or Organizations:   . Attends Archivist Meetings:   Marland Kitchen Marital Status:      Family History: [name]; Aneurysm in her father; Breast cancer in her mother; Diabetes in her sister, sister, sister, and sister; Hypertension in her mother; Kidney failure in her mother. ROS:   Please see the history of present illness.    All 14 point review of systems negative except as described per history of present illness  EKGs/Labs/Other Studies Reviewed:      Recent Labs: 12/27/2019: NT-Pro BNP 2,515 01/21/2020: BUN 39; Creatinine, Ser 1.29; Potassium 4.5; Sodium 134  Recent Lipid Panel No results found for: CHOL, TRIG, HDL, CHOLHDL, VLDL, LDLCALC, LDLDIRECT  Physical Exam:    VS:  BP 130/80 (BP Location: Left Arm, Patient Position:  Sitting, Cuff Size: Normal)   Pulse 78   Ht 4' 9.5" (1.461 m)   Wt 148 lb 6.4 oz (67.3 kg)   SpO2 96%   BMI 31.56 kg/m     Wt Readings from Last 3 Encounters:  02/13/20 148 lb 6.4 oz (67.3 kg)  12/27/19 148 lb 3.2 oz (67.2 kg)  11/06/19 161 lb (73 kg)     GEN:  Well nourished, well developed in no acute distress HEENT: Normal NECK: No JVD; No carotid bruits LYMPHATICS: No lymphadenopathy CARDIAC: RRR, no murmurs, no rubs, no gallops RESPIRATORY:  Clear to auscultation without rales, wheezing or rhonchi  ABDOMEN: Soft, non-tender, non-distended MUSCULOSKELETAL:  No edema; No deformity  SKIN: Warm and dry LOWER EXTREMITIES: no swelling NEUROLOGIC:  Alert and oriented x 3 PSYCHIATRIC:  Normal affect  ASSESSMENT:    1. Chronic combined systolic and diastolic CHF (congestive heart failure) ([place])   2. Ischemic cardiomyopathy   3. Benign essential hypertension   4. Mixed hyperlipidemia    PLAN:    In order of problems listed above:  1. Chronic diastolic and systolic congestive heart failure we will double the dose of Entresto, she will have Chem-7 done during our next week.  She is already on beta-blocker which I will continue. 2. Benign essential hypertension blood pressure well controlled 130/80 we will continue present management. 3. Mixed dyslipidemia: She is on Crestor 40 which is high intense statin which I will continue.   Medication Adjustments/Labs and Tests Ordered: Current medicines are reviewed at length with the patient today.  Concerns regarding medicines are outlined above.  No orders of the defined types were placed in this encounter.  Medication changes: No orders of the defined types were placed in this encounter.   Signed, Park Liter, MD, [facility] 02/13/2020 3:44 PM    [place]

## 2020-02-13 NOTE — Patient Instructions (Signed)
Medication Instructions:  Your physician has recommended you make the following change in your medication:   INCREASE: Entresto 49/51 twice daily  *If you need a refill on your cardiac medications before your next appointment, please call your pharmacy*   Lab Work: Your physician recommends that you return for lab work today: bmp   If you have labs (blood work) drawn today and your tests are completely normal, you will receive your results only by: Marland Kitchen MyChart Message (if you have MyChart) OR . A paper copy in the mail If you have any lab test that is abnormal or we need to change your treatment, we will call you to review the results.   Testing/Procedures: None.    Follow-Up: At [facility], you and your health needs are our priority.  As part of our continuing mission to provide you with exceptional heart care, we have created designated Provider Care Teams.  These Care Teams include your primary Cardiologist (physician) and Advanced Practice Providers (APPs -  Physician Assistants and Nurse Practitioners) who all work together to provide you with the care you need, when you need it.  We recommend signing up for the patient portal called "MyChart".  Sign up information is provided on this After Visit Summary.  MyChart is used to connect with patients for Virtual Visits (Telemedicine).  Patients are able to view lab/test results, encounter notes, upcoming appointments, etc.  Non-urgent messages can be sent to your provider as well.   To learn more about what you can do with MyChart, go to [email or web address].    Your next appointment:   2 month(s)  The format for your next appointment:   In Person  Provider:   [name], MD   Other Instructions

## 2020-02-14 LAB — BASIC METABOLIC PANEL
BUN/Creatinine Ratio: 19 (ref 12–28)
BUN: 24 mg/dL (ref 8–27)
CO2: 27 mmol/L (ref 20–29)
Calcium: 9.3 mg/dL (ref 8.7–10.3)
Chloride: 98 mmol/L (ref 96–106)
Creatinine, Ser: 1.27 mg/dL — ABNORMAL HIGH (ref 0.57–1.00)
GFR calc Af Amer: 50 mL/min/{1.73_m2} — ABNORMAL LOW (ref >59)
GFR calc non Af Amer: 44 mL/min/{1.73_m2} — ABNORMAL LOW (ref >59)
Glucose: 228 mg/dL — ABNORMAL HIGH (ref 65–99)
Potassium: 3.6 mmol/L (ref 3.5–5.2)
Sodium: 139 mmol/L (ref 134–144)

## 2020-02-17 ENCOUNTER — Telehealth: Payer: Self-pay

## 2020-02-17 NOTE — Telephone Encounter (Signed)
Spoke with patient regarding results and recommendation.  Patient verbalizes understanding and is agreeable to plan of care. Advised patient to call back with any issues or concerns.  

## 2020-02-17 NOTE — Telephone Encounter (Signed)
-----   Message from Park Liter, MD sent at 02/17/2020  8:38 AM EDT ----- Kidney function looks stable we will continue present management, sugar is still elevated

## 2020-04-22 ENCOUNTER — Other Ambulatory Visit: Payer: Self-pay

## 2020-04-22 ENCOUNTER — Encounter: Payer: Self-pay | Admitting: Cardiology

## 2020-04-22 ENCOUNTER — Ambulatory Visit: Payer: Medicare Other | Admitting: Cardiology

## 2020-04-22 VITALS — BP 130/78 | HR 70 | Ht <= 58 in | Wt 150.0 lb

## 2020-04-22 DIAGNOSIS — I255 Ischemic cardiomyopathy: Secondary | ICD-10-CM | POA: Diagnosis not present

## 2020-04-22 DIAGNOSIS — E785 Hyperlipidemia, unspecified: Secondary | ICD-10-CM | POA: Diagnosis not present

## 2020-04-22 DIAGNOSIS — E782 Mixed hyperlipidemia: Secondary | ICD-10-CM

## 2020-04-22 DIAGNOSIS — I10 Essential (primary) hypertension: Secondary | ICD-10-CM | POA: Diagnosis not present

## 2020-04-22 LAB — BASIC METABOLIC PANEL
BUN/Creatinine Ratio: 20 (ref 12–28)
BUN: 24 mg/dL (ref 8–27)
CO2: 28 mmol/L (ref 20–29)
Calcium: 9.5 mg/dL (ref 8.7–10.3)
Chloride: 95 mmol/L — ABNORMAL LOW (ref 96–106)
Creatinine, Ser: 1.22 mg/dL — ABNORMAL HIGH (ref 0.57–1.00)
GFR calc Af Amer: 53 mL/min/{1.73_m2} — ABNORMAL LOW (ref >59)
GFR calc non Af Amer: 46 mL/min/{1.73_m2} — ABNORMAL LOW (ref >59)
Glucose: 325 mg/dL — ABNORMAL HIGH (ref 65–99)
Potassium: 3.6 mmol/L (ref 3.5–5.2)
Sodium: 137 mmol/L (ref 134–144)

## 2020-04-22 NOTE — Patient Instructions (Signed)
Medication Instructions:  Your physician recommends that you continue on your current medications as directed. Please refer to the Current Medication list given to you today.  *If you need a refill on your cardiac medications before your next appointment, please call your pharmacy*   Lab Work: Your physician recommends that you return for lab work today: bmp   If you have labs (blood work) drawn today and your tests are completely normal, you will receive your results only by: Marland Kitchen MyChart Message (if you have MyChart) OR . A paper copy in the mail If you have any lab test that is abnormal or we need to change your treatment, we will call you to review the results.   Testing/Procedures: None.    Follow-Up: At [facility], you and your health needs are our priority.  As part of our continuing mission to provide you with exceptional heart care, we have created designated Provider Care Teams.  These Care Teams include your primary Cardiologist (physician) and Advanced Practice Providers (APPs -  Physician Assistants and Nurse Practitioners) who all work together to provide you with the care you need, when you need it.  We recommend signing up for the patient portal called "MyChart".  Sign up information is provided on this After Visit Summary.  MyChart is used to connect with patients for Virtual Visits (Telemedicine).  Patients are able to view lab/test results, encounter notes, upcoming appointments, etc.  Non-urgent messages can be sent to your provider as well.   To learn more about what you can do with MyChart, go to [email or web address].    Your next appointment:   6 month(s)  The format for your next appointment:   In Person  Provider:   [name], MD   Other Instructions

## 2020-04-22 NOTE — Addendum Note (Signed)
Addended by: Senaida Ores on: 04/22/2020 10:10 AM   Modules accepted: Orders

## 2020-04-22 NOTE — Progress Notes (Signed)
Cardiology Office Note:    Date:  04/22/2020   ID:  Alexandra Baker, DOB 31-May-1951, MRN [id number]  PCP:  Alma Friendly, MD  Cardiologist:  [name], MD    Referring MD: Alma Friendly, MD   Chief Complaint  Patient presents with  . Follow-up  Doing well but I do have wound on my daily  History of Present Illness:    Alexandra Baker is a 69 y.o. female with past medical history significant for coronary artery disease, status post coronary bypass graft 1999 at [facility] hospital, recently she was admitted to the hospital was found to have diminished ejection fraction 40 to 45%.  The purpose of this visit is to gradually put on appropriate medical therapy.  I we somewhat delay with augmenting her therapy because she was grieving after her husband who died because of COVID-91.  She comes today to my office for follow-up.  Doing well cardiac wise denies have any chest pain tightness squeezing pressure burning chest.  I will check Chem-7 today if Chem-7 is fine will double the dose of Entresto.  She does have open wound on her belly that being follow-up by dermatology.  As well as wound care center.  That bothers her a lot.  She tells me that her sugar is much better control right now.  She does have discontinue glucose monitor on her arm.  Past Medical History:  Diagnosis Date  . Benign essential hypertension 05/11/2016  . Cardiomyopathy ([place]) ejection fraction 4045% in November 2020 07/18/2019  . Cellulitis of left lower extremity 05/29/2019  . Congestive heart failure ([place]) New York Heart Association class III 07/18/2019  . Coronary disease 07/18/2019  . GAD (generalized anxiety disorder) 07/18/2019  . GERD (gastroesophageal reflux disease) 07/18/2019  . Heme positive stool 06/20/2018   Refuses colonoscopy  . Hyperlipidemia 05/11/2016  . Hypertension 07/18/2019  . Medically noncompliant 09/12/2018  . Memory loss 06/20/2018   5/5 mini cog refuses neuro eval  Since heart surgery 11/19  . Mild intermittent asthma without complication 92/06/9416   Managed by dr. Alcide Clever  . Status post coronary artery bypass graft 1999 done at [facility] 07/18/2019  . Tremor 06/20/2018   Declines neuro eval  . Uncontrolled type 2 diabetes mellitus with hyperglycemia ([place]) 05/11/2016  . Uncontrolled type 2 diabetes mellitus with microalbuminuria, with long-term current use of insulin ([place]) 07/18/2019   Managed by dr. Tamala Julian endo exclusively  Pt noncompliant with meds and ov    Past Surgical History:  Procedure Laterality Date  . CARPAL TUNNEL RELEASE    . CESAREAN SECTION    . CHOLECYSTECTOMY    . CORONARY ARTERY BYPASS GRAFT    . DILATION AND CURETTAGE OF UTERUS    . WRIST FRACTURE SURGERY      Current Medications: Current Meds  Medication Sig  . albuterol (VENTOLIN HFA) 108 (90 Base) MCG/ACT inhaler Inhale 2 puffs into the lungs every 6 (six) hours as needed.  . Aspirin Buf,CaCarb-MgCarb-MgO, 81 MG TABS Take 1 tablet by mouth daily.  . budesonide-formoterol (SYMBICORT) 160-4.5 MCG/ACT inhaler Inhale 1 puff into the lungs daily.   . carvedilol (COREG) 6.25 MG tablet TAKE ONE (1) TABLET BY MOUTH TWO (2) TIMES DAILY  . doxycycline (VIBRAMYCIN) 100 MG capsule Take 100 mg by mouth daily.  Marland Kitchen ENTRESTO 24-26 MG Take 1 tablet by mouth 2 (two) times daily.  . fluconazole (DIFLUCAN) 100 MG tablet Take 100 mg by mouth once a week.  . furosemide (LASIX)  40 MG tablet Take 1.5 tablets (60 mg total) by mouth daily.  Marland Kitchen HYDROcodone-acetaminophen (NORCO/VICODIN) 5-325 MG tablet as needed.  . Insulin Glargine, 1 Unit Dial, (TOUJEO SOLOSTAR) 300 UNIT/ML SOPN Inject 30 Units into the skin daily.   . insulin lispro (HUMALOG) 100 UNIT/ML KwikPen Inject 8 Units into the skin. With each meal  . lidocaine-prilocaine (EMLA) cream SMARTSIG:3 Topical 10 Times Daily PRN  . loratadine (CLARITIN) 10 MG tablet Take 1 mg by mouth daily.  . meloxicam (MOBIC) 15 MG tablet  Take 15 mg by mouth as needed.  . Multiple Vitamin (MULTIVITAMIN) capsule Take 1 capsule by mouth daily.  . nitroGLYCERIN (NITROLINGUAL) 0.4 MG/SPRAY spray Place 1 spray under the tongue as needed for chest pain.  Marland Kitchen omeprazole (PRILOSEC) 40 MG capsule Take 1 capsule by mouth daily.  . rosuvastatin (CRESTOR) 40 MG tablet Take 1 tablet (40 mg total) by mouth daily.  Marland Kitchen SANTYL ointment Apply 1 application topically daily.  Marland Kitchen triamcinolone ointment (KENALOG) 0.1 % Apply 1 application topically 2 (two) times daily.  . vitamin B-12 (CYANOCOBALAMIN) 1000 MCG tablet Take 1,000 mcg by mouth daily.     Allergies:   Tea, Ezetimibe-simvastatin, and Pitocin [oxytocin]   Social History   Socioeconomic History  . Marital status: Married    Spouse name: Not on file  . Number of children: Not on file  . Years of education: Not on file  . Highest education level: Not on file  Occupational History  . Not on file  Tobacco Use  . Smoking status: Never Smoker  . Smokeless tobacco: Never Used  Vaping Use  . Vaping Use: Never used  Substance and Sexual Activity  . Alcohol use: Never  . Drug use: Never  . Sexual activity: Not on file  Other Topics Concern  . Not on file  Social History Narrative  . Not on file   Social Determinants of Health   Financial Resource Strain:   . Difficulty of Paying Living Expenses: Not on file  Food Insecurity:   . Worried About Charity fundraiser in the Last Year: Not on file  . Ran Out of Food in the Last Year: Not on file  Transportation Needs:   . Lack of Transportation (Medical): Not on file  . Lack of Transportation (Non-Medical): Not on file  Physical Activity:   . Days of Exercise per Week: Not on file  . Minutes of Exercise per Session: Not on file  Stress:   . Feeling of Stress : Not on file  Social Connections:   . Frequency of Communication with Friends and Family: Not on file  . Frequency of Social Gatherings with Friends and Family: Not on file    . Attends Religious Services: Not on file  . Active Member of Clubs or Organizations: Not on file  . Attends Archivist Meetings: Not on file  . Marital Status: Not on file     Family History: [name]; Aneurysm in her father; Breast cancer in her mother; Diabetes in her sister, sister, sister, and sister; Hypertension in her mother; Kidney failure in her mother. ROS:   Please see the history of present illness.    All 14 point review of systems negative except as described per history of present illness  EKGs/Labs/Other Studies Reviewed:      Recent Labs: 12/27/2019: NT-Pro BNP 2,515 02/13/2020: BUN 24; Creatinine, Ser 1.27; Potassium 3.6; Sodium 139  Recent Lipid  Panel No results found for: CHOL, TRIG, HDL, CHOLHDL, VLDL, LDLCALC, LDLDIRECT  Physical Exam:    VS:  BP 130/78 (BP Location: Right Arm, Patient Position: Sitting, Cuff Size: Large)   Pulse 70   Ht 4' 9.5" (1.461 m)   Wt 150 lb (68 kg)   SpO2 97%   BMI 31.90 kg/m     Wt Readings from Last 3 Encounters:  04/22/20 150 lb (68 kg)  02/13/20 148 lb 6.4 oz (67.3 kg)  12/27/19 148 lb 3.2 oz (67.2 kg)     GEN:  Well nourished, well developed in no acute distress HEENT: Normal NECK: No JVD; No carotid bruits LYMPHATICS: No lymphadenopathy CARDIAC: RRR, no murmurs, no rubs, no gallops RESPIRATORY:  Clear to auscultation without rales, wheezing or rhonchi  ABDOMEN: Soft, non-tender, non-distended MUSCULOSKELETAL:  No edema; No deformity  SKIN: Warm and dry LOWER EXTREMITIES: no swelling NEUROLOGIC:  Alert and oriented x 3 PSYCHIATRIC:  Normal affect   ASSESSMENT:    1. Ischemic cardiomyopathy   2. Essential hypertension   3. Dyslipidemia   4. Mixed hyperlipidemia    PLAN:    In order of problems listed above:  1. Ischemic cardiomyopathy asymptomatic doing well.  I will check Chem-7 today if Chem-7 is fine will double the dose of  Entresto.  Because of history of asthma I prefer not to go up with the recording at this stage. 2. Essential hypertension blood pressure well controlled today continue present management. 3. Dyslipidemia followed by internal medicine team.  She is on high intense statin Crestor 40 mg which I will continue.  I call primary care physician to get her fasting lipid profile. 4. Overall she is doing well continue present management see her back in 6 months   Medication Adjustments/Labs and Tests Ordered: Current medicines are reviewed at length with the patient today.  Concerns regarding medicines are outlined above.  No orders of the defined types were placed in this encounter.  Medication changes: No orders of the defined types were placed in this encounter.   Signed, Park Liter, MD, [facility] 04/22/2020 10:04 AM    [place]

## 2020-04-28 ENCOUNTER — Telehealth: Payer: Self-pay | Admitting: Emergency Medicine

## 2020-04-28 DIAGNOSIS — Z79899 Other long term (current) drug therapy: Secondary | ICD-10-CM

## 2020-04-28 NOTE — Telephone Encounter (Signed)
Called patient. Informed her of results and recommendations. She reports that she is not taking entresto 24/26 twice a day only once a day. She will increase to twice a day and have labs redrawn next week. Will not increase to the 49/51 dose yet. Will inform Dr. Agustin Cree to make sure he agrees.

## 2020-04-28 NOTE — Telephone Encounter (Signed)
-----   Message from Park Liter, MD sent at 04/23/2020 11:11 AM EDT ----- Kidney function looks good, please double dose of Entresto.  She need to be taken 4852 mg twice daily, Chem-7 need to be checked next week

## 2020-06-08 ENCOUNTER — Other Ambulatory Visit: Payer: Self-pay | Admitting: Cardiology

## 2020-06-23 DIAGNOSIS — M810 Age-related osteoporosis without current pathological fracture: Secondary | ICD-10-CM

## 2020-06-23 HISTORY — DX: Age-related osteoporosis without current pathological fracture: M81.0

## 2020-08-07 ENCOUNTER — Other Ambulatory Visit: Payer: Self-pay | Admitting: Cardiology

## 2020-10-20 ENCOUNTER — Other Ambulatory Visit: Payer: Self-pay

## 2020-10-21 ENCOUNTER — Other Ambulatory Visit: Payer: Self-pay

## 2020-10-21 ENCOUNTER — Ambulatory Visit: Payer: Medicare Other | Admitting: Cardiology

## 2020-10-21 ENCOUNTER — Encounter: Payer: Self-pay | Admitting: Cardiology

## 2020-10-21 VITALS — BP 130/78 | HR 76 | Ht <= 58 in | Wt 165.0 lb

## 2020-10-21 DIAGNOSIS — I255 Ischemic cardiomyopathy: Secondary | ICD-10-CM

## 2020-10-21 DIAGNOSIS — Z951 Presence of aortocoronary bypass graft: Secondary | ICD-10-CM | POA: Diagnosis not present

## 2020-10-21 DIAGNOSIS — I10 Essential (primary) hypertension: Secondary | ICD-10-CM

## 2020-10-21 DIAGNOSIS — E785 Hyperlipidemia, unspecified: Secondary | ICD-10-CM

## 2020-10-21 LAB — BASIC METABOLIC PANEL
BUN/Creatinine Ratio: 25 (ref 12–28)
BUN: 32 mg/dL — ABNORMAL HIGH (ref 8–27)
CO2: 28 mmol/L (ref 20–29)
Calcium: 9.2 mg/dL (ref 8.7–10.3)
Chloride: 97 mmol/L (ref 96–106)
Creatinine, Ser: 1.29 mg/dL — ABNORMAL HIGH (ref 0.57–1.00)
Glucose: 340 mg/dL — ABNORMAL HIGH (ref 65–99)
Potassium: 3.7 mmol/L (ref 3.5–5.2)
Sodium: 141 mmol/L (ref 134–144)
eGFR: 45 mL/min/{1.73_m2} — ABNORMAL LOW (ref >59)

## 2020-10-21 NOTE — Patient Instructions (Signed)
Medication Instructions:  Your physician recommends that you continue on your current medications as directed. Please refer to the Current Medication list given to you today.  *If you need a refill on your cardiac medications before your next appointment, please call your pharmacy*   Lab Work: Your physician recommends that you return for lab work today: bmp   If you have labs (blood work) drawn today and your tests are completely normal, you will receive your results only by: Marland Kitchen MyChart Message (if you have MyChart) OR . A paper copy in the mail If you have any lab test that is abnormal or we need to change your treatment, we will call you to review the results.   Testing/Procedures: none   Follow-Up: At [facility], you and your health needs are our priority.  As part of our continuing mission to provide you with exceptional heart care, we have created designated Provider Care Teams.  These Care Teams include your primary Cardiologist (physician) and Advanced Practice Providers (APPs -  Physician Assistants and Nurse Practitioners) who all work together to provide you with the care you need, when you need it.  We recommend signing up for the patient portal called "MyChart".  Sign up information is provided on this After Visit Summary.  MyChart is used to connect with patients for Virtual Visits (Telemedicine).  Patients are able to view lab/test results, encounter notes, upcoming appointments, etc.  Non-urgent messages can be sent to your provider as well.   To learn more about what you can do with MyChart, go to [email or web address].    Your next appointment:   6 month(s)  The format for your next appointment:   In Person  Provider:   [name], MD   Other Instructions

## 2020-10-21 NOTE — Progress Notes (Signed)
Cardiology Office Note:    Date:  10/21/2020   ID:  Alexandra Baker, DOB February 09, 1951, MRN [id number]  PCP:  Alma Friendly, MD  Cardiologist:  [name], MD    Referring MD: Alma Friendly, MD   Chief Complaint  Patient presents with  . Follow-up  Doing well  History of Present Illness:    Alexandra Baker is a 71 y.o. female with past medical history significant for coronary artery disease, status post coronary bypass grafting 1999 at [facility] hospital, last year she was admitted to the hospital she was found to have diminished ejection fraction 40 to 45%.  She is coming to my office on the regular basis to get titrated with her medical therapy.  She also got history of diabetes which is poorly controlled, dyslipidemia.  Essential hypertension. Comes today to my office with her boyfriend.  She seems to be very happy and cheerful.  Denies have any chest pain tightness squeezing pressure burning chest.  She is scheduled to have cataract surgery and she is looking forward to it.  She is also planning to travel within the next few weeks.  Denies have any chest pain tightness squeezing pressure burning chest.  Past Medical History:  Diagnosis Date  . Age-related osteoporosis without current pathological fracture 06/23/2020   Formatting of this note might be different from the original. Bone density January 2021 T score -2.6 to right hip -1.1 to lumbar spine patient refusing treatment  . Asthmatic bronchitis without complication 123XX123   Managed by dr. Alcide Clever  . Benign essential hypertension 05/11/2016  . Cardiomyopathy ([place]) ejection fraction 4045% in November 2020 07/18/2019  . Cellulitis of left lower extremity 05/29/2019  . Chronic combined systolic and diastolic CHF (congestive heart failure) ([place]) 12/31/2019   Seeing cards in [place]  . Congestive heart failure ([place]) New York Heart Association class III 07/18/2019  . Coronary disease 07/18/2019  .  Dyslipidemia 12/25/2019  . GAD (generalized anxiety disorder) 07/18/2019  . GERD (gastroesophageal reflux disease) 07/18/2019  . Heme positive stool 06/20/2018   Refuses colonoscopy  . Hyperlipidemia 05/11/2016  . Hypertension 07/18/2019  . Ischemic cardiomyopathy 12/31/2019  . Medically noncompliant 09/12/2018  . Memory loss 06/20/2018   5/5 mini cog refuses neuro eval Since heart surgery 11/19  . Mild intermittent asthma without complication 123XX123   Managed by dr. Alcide Clever  . Stage 2 chronic kidney disease 02/14/2017  . Status post coronary artery bypass graft 1999 done at [facility] 07/18/2019  . Tremor 06/20/2018   Declines neuro eval  . Uncontrolled type 2 diabetes mellitus with hyperglycemia ([place]) 05/11/2016  . Uncontrolled type 2 diabetes mellitus with microalbuminuria, with long-term current use of insulin ([place]) 07/18/2019   Managed by dr. Tamala Julian endo exclusively  Pt noncompliant with meds and ov    Past Surgical History:  Procedure Laterality Date  . CARPAL TUNNEL RELEASE    . CESAREAN SECTION    . CHOLECYSTECTOMY    . CORONARY ARTERY BYPASS GRAFT    . DILATION AND CURETTAGE OF UTERUS    . WRIST FRACTURE SURGERY      Current Medications: Current Meds  Medication Sig  . albuterol (VENTOLIN HFA) 108 (90 Base) MCG/ACT inhaler Inhale 2 puffs into the lungs every 6 (six) hours as needed for shortness of breath or wheezing.  . APPLE CIDER VINEGAR PO Take 450 mg by mouth daily.  . Aspirin Buf,CaCarb-MgCarb-MgO, 81 MG TABS Take 1 tablet by mouth daily.  . budesonide-formoterol (SYMBICORT) 160-4.5  MCG/ACT inhaler Inhale 1 puff into the lungs daily as needed (sob).  . carvedilol (COREG) 6.25 MG tablet TAKE ONE (1) TABLET BY MOUTH TWO (2) TIMES DAILY  . cetirizine (ZYRTEC) 10 MG tablet Take 10 mg by mouth daily.  . Cholecalciferol (VITAMIN D-3) 125 MCG (5000 UT) TABS Take by mouth.  . Coenzyme Q10 50 MG CAPS Take 50 mg by mouth daily.  Marland Kitchen ENTRESTO 24-26 MG TAKE  ONE (1) TABLET BY MOUTH TWO (2) TIMES DAILY  . furosemide (LASIX) 40 MG tablet TAKE 1 AND 1/2 TABLETS BY MOUTH DAILY  . HYDROcodone-acetaminophen (NORCO/VICODIN) 5-325 MG tablet as needed for moderate pain or severe pain.  . Insulin Glargine, 1 Unit Dial, (TOUJEO SOLOSTAR) 300 UNIT/ML SOPN Inject 30 Units into the skin daily.   . insulin lispro (HUMALOG) 100 UNIT/ML KwikPen Inject 8 Units into the skin. With each meal  . loratadine (CLARITIN) 10 MG tablet Take 1 mg by mouth daily.  . Multiple Vitamin (MULTIVITAMIN) capsule Take 1 capsule by mouth daily.  . nitroGLYCERIN (NITROLINGUAL) 0.4 MG/SPRAY spray Place 1 spray under the tongue as needed for chest pain.  . Omega-3 Fatty Acids (FISH OIL) 1000 MG CAPS Take 1 capsule by mouth daily.  Marland Kitchen omeprazole (PRILOSEC) 40 MG capsule Take 1 capsule by mouth daily.  . rosuvastatin (CRESTOR) 40 MG tablet TAKE ONE (1) TABLET BY MOUTH EVERY DAY  . SANTYL ointment Apply 1 application topically daily.  Marland Kitchen triamcinolone ointment (KENALOG) 0.1 % Apply 1 application topically 2 (two) times daily.  . valACYclovir (VALTREX) 1000 MG tablet Take 1,000 mg by mouth 2 (two) times daily.  . vitamin B-12 (CYANOCOBALAMIN) 1000 MCG tablet Take 1,000 mcg by mouth daily.  . [DISCONTINUED] lidocaine-prilocaine (EMLA) cream SMARTSIG:3 Topical 10 Times Daily PRN     Allergies:   Tea, Ezetimibe-simvastatin, and Pitocin [oxytocin]   Social History   Socioeconomic History  . Marital status: Married    Spouse name: Not on file  . Number of children: Not on file  . Years of education: Not on file  . Highest education level: Not on file  Occupational History  . Not on file  Tobacco Use  . Smoking status: Never Smoker  . Smokeless tobacco: Never Used  Vaping Use  . Vaping Use: Never used  Substance and Sexual Activity  . Alcohol use: Never  . Drug use: Never  . Sexual activity: Not on file  Other Topics Concern  . Not on file  Social History Narrative  . Not on file    Social Determinants of Health   Financial Resource Strain: Not on file  Food Insecurity: Not on file  Transportation Needs: Not on file  Physical Activity: Not on file  Stress: Not on file  Social Connections: Not on file     Family History: [name]; Aneurysm in her father; Breast cancer in her mother; Diabetes in her sister, sister, sister, and sister; Hypertension in her mother; Kidney failure in her mother. ROS:   Please see the history of present illness.    All 14 point review of systems negative except as described per history of present illness  EKGs/Labs/Other Studies Reviewed:      Recent Labs: 12/27/2019: NT-Pro BNP 2,515 04/22/2020: BUN 24; Creatinine, Ser 1.22; Potassium 3.6; Sodium 137  Recent Lipid Panel No results found for: CHOL, TRIG, HDL, CHOLHDL, VLDL, LDLCALC, LDLDIRECT  Physical Exam:    VS:  BP 130/78 (BP Location: Right  Arm, Patient Position: Sitting)   Pulse 76   Ht 4' 9.5" (1.461 m)   Wt 165 lb (74.8 kg)   SpO2 96%   BMI 35.09 kg/m     Wt Readings from Last 3 Encounters:  10/21/20 165 lb (74.8 kg)  04/22/20 150 lb (68 kg)  02/13/20 148 lb 6.4 oz (67.3 kg)     GEN:  Well nourished, well developed in no acute distress HEENT: Normal NECK: No JVD; No carotid bruits LYMPHATICS: No lymphadenopathy CARDIAC: RRR, no murmurs, no rubs, no gallops RESPIRATORY:  Clear to auscultation without rales, wheezing or rhonchi  ABDOMEN: Soft, non-tender, non-distended MUSCULOSKELETAL:  No edema; No deformity  SKIN: Warm and dry LOWER EXTREMITIES: no swelling NEUROLOGIC:  Alert and oriented x 3 PSYCHIATRIC:  Normal affect   ASSESSMENT:    1. Benign essential hypertension   2. Ischemic cardiomyopathy   3. Primary hypertension   4. Dyslipidemia   5. Status post coronary artery bypass graft 1999 done at [facility]    PLAN:    In order of problems listed  above:  1. Coronary disease stable from that point review.  We will continue present management.  Asymptomatic. 2. History of cardiomyopathy Chem-7 will be done today to see if there is any room to improve her medications.  I was hoping to be able to increase dose of Entresto, however last time we check her kidney function was diminished we decided not to do it.  If her Chem-7 still show [place] the same level of kidney dysfunction we may be forced to double the dose of Coreg, however she does have some history of asthma which can complicate the situation. 3. Essential hypertension, blood pressure seems to be well controlled we will continue present medication. 4. Dyslipidemia will call primary care physician to get her fasting lipid profile. 5. Vessels coronary bypass graft.  Noted.   Medication Adjustments/Labs and Tests Ordered: Current medicines are reviewed at length with the patient today.  Concerns regarding medicines are outlined above.  Orders Placed This Encounter  Procedures  . Basic metabolic panel   Medication changes: No orders of the defined types were placed in this encounter.   Signed, Park Liter, MD, [facility] 10/21/2020 10:34 AM    [place]

## 2020-11-23 ENCOUNTER — Telehealth: Payer: Self-pay | Admitting: Physician Assistant

## 2020-11-23 NOTE — Telephone Encounter (Signed)
   Dtr called, Pt is in [facility], BP very high and volume overloaded.  Dtr is concerned that wt is still going up, still SOB, BP still very high. Not urinating much.  Advised that she should talk to the MDs there.   If current treatment is not working, need to change it.  Dtr says she will do so.  Rosaria Ferries, PA-C 11/23/2020 7:14 AM

## 2020-11-25 ENCOUNTER — Encounter: Payer: Self-pay | Admitting: Cardiology

## 2020-11-27 ENCOUNTER — Telehealth: Payer: Self-pay | Admitting: Cardiology

## 2020-11-27 NOTE — Telephone Encounter (Signed)
Please advise. Pt has been inpatient at [facility] 11/20/20. I attempted to call pt but call went to VM.

## 2020-11-27 NOTE — Telephone Encounter (Signed)
Pt c/o swelling: STAT is pt has developed SOB within 24 hours  1) How much weight have you gained and in what time span? NO   2) If swelling, where is the swelling located? Legs and feet  3) Are you currently taking a fluid pill? No   4) Are you currently SOB? NO  5) Do you have a log of your daily weights (if so, list)? No   6) Have you gained 3 pounds in a day or 5 pounds in a week? No weight gain  7) Have you traveled recently? No     PT is calling with concerns of her legs and feet swelling.Pt is also stating she has a appt on 5/3 but she wants to what to do in the meantime until her appt.Please advise

## 2020-12-01 ENCOUNTER — Encounter: Payer: Self-pay | Admitting: Cardiology

## 2020-12-01 ENCOUNTER — Ambulatory Visit: Payer: Medicare Other | Admitting: Cardiology

## 2020-12-01 ENCOUNTER — Other Ambulatory Visit: Payer: Self-pay

## 2020-12-01 DIAGNOSIS — I509 Heart failure, unspecified: Secondary | ICD-10-CM | POA: Diagnosis not present

## 2020-12-01 DIAGNOSIS — I10 Essential (primary) hypertension: Secondary | ICD-10-CM

## 2020-12-01 MED ORDER — METOLAZONE 5 MG PO TABS: ORAL_TABLET | ORAL | 0 refills | Status: DC

## 2020-12-01 MED ORDER — TORSEMIDE 20 MG PO TABS: 10.0000 mg | ORAL_TABLET | Freq: Two times a day (BID) | ORAL | 3 refills | Status: DC

## 2020-12-01 NOTE — Progress Notes (Signed)
Cardiology Office Note:    Date:  12/01/2020   ID:  Alexandra Baker, DOB Apr 17, 1951, MRN [id number]  PCP:  Alma Friendly, MD  Cardiologist:  None  Electrophysiologist:  None   Referring MD: Alma Friendly, MD   Has been having a lot of bilateral leg swelling  History of Present Illness:    Alexandra Baker is a 70 y.o. female with a hx of coronary artery disease, status post CABG in 1999 at [facility], depressed ejection fraction EF 40 to 45%, recent echocardiogram done in April 2023 at [place] show EF of 45 to 50%, mild pulmonary hypertension with mild mitral regurgitation and moderate biatrial enlargement, hypertension.  The patient follows with Dr. Agustin Cree and was last seen by him in March 2022 at that time she appeared to be asymptomatic doing well from a cardiovascular standpoint.  Unfortunately since her last visit she had been admitted at [facility] for acute heart failure exacerbation.  She also did not was seen in the emergency department for leg swelling recently.  She is here today to be seen.  I am seeing the patient in the office today because of availability for her acute visit. The patient is a bit disappointed as she was anticipating that her visit was going to be with Dr. Agustin Cree.  I offered to reschedule her with Dr. Agustin Cree but she declined and said we can go on with this visit.  Past Medical History:  Diagnosis Date  . Age-related osteoporosis without current pathological fracture 06/23/2020   Formatting of this note might be different from the original. Bone density January 2021 T score -2.6 to right hip -1.1 to lumbar spine patient refusing treatment  . Asthmatic bronchitis without complication 123XX123   Managed by dr. Alcide Clever  . Benign essential hypertension 05/11/2016  . Cardiomyopathy ([place]) ejection fraction 4045% in November 2020 07/18/2019  . Cellulitis of left lower extremity 05/29/2019  . Chronic combined systolic and  diastolic CHF (congestive heart failure) ([place]) 12/31/2019   Seeing cards in [place]  . Congestive heart failure ([place]) New [place] Heart Association class III 07/18/2019  . Coronary disease 07/18/2019  . Dyslipidemia 12/25/2019  . GAD (generalized anxiety disorder) 07/18/2019  . GERD (gastroesophageal reflux disease) 07/18/2019  . Heme positive stool 06/20/2018   Refuses colonoscopy  . Hyperlipidemia 05/11/2016  . Hypertension 07/18/2019  . Ischemic cardiomyopathy 12/31/2019  . Medically noncompliant 09/12/2018  . Memory loss 06/20/2018   5/5 mini cog refuses neuro eval Since heart surgery 11/19  . Mild intermittent asthma without complication 123XX123   Managed by dr. Alcide Clever  . Stage 2 chronic kidney disease 02/14/2017  . Status post coronary artery bypass graft 1999 done at [facility] 07/18/2019  . Tremor 06/20/2018   Declines neuro eval  . Uncontrolled type 2 diabetes mellitus with hyperglycemia ([place]) 05/11/2016  . Uncontrolled type 2 diabetes mellitus with microalbuminuria, with long-term current use of insulin ([place]) 07/18/2019   Managed by dr. Tamala Julian endo exclusively  Pt noncompliant with meds and ov    Past Surgical History:  Procedure Laterality Date  . CARPAL TUNNEL RELEASE    . CESAREAN SECTION    . CHOLECYSTECTOMY    . CORONARY ARTERY BYPASS GRAFT    . DILATION AND CURETTAGE OF UTERUS    . WRIST FRACTURE SURGERY      Current Medications: Current Meds  Medication Sig  . albuterol (VENTOLIN HFA) 108 (90 Base) MCG/ACT inhaler Inhale 1 puff into the lungs every 6 (  six) hours as needed for shortness of breath or wheezing.  Marland Kitchen aspirin EC 81 MG tablet Take 81 mg by mouth daily. Swallow whole.  . B Complex-C (SUPER B COMPLEX PO) Take 1 tablet by mouth daily. Unknown strength  . budesonide-formoterol (SYMBICORT) 160-4.5 MCG/ACT inhaler Inhale 1 puff into the lungs daily as needed (Shortness of breath).  . Calcium-Magnesium-Vitamin D (CALCIUM MAGNESIUM PO) Take 1  tablet by mouth daily. Unknown strength  . carvedilol (COREG) 6.25 MG tablet Take 6.25 mg by mouth 2 (two) times daily with a meal.  . cephALEXin (KEFLEX) 250 MG capsule Take 1 capsule by mouth in the morning, at noon, in the evening, and at bedtime. 5 days  . cholecalciferol (VITAMIN D3) 25 MCG (1000 UNIT) tablet Take 1,000 Units by mouth daily.  . Coenzyme Q10 50 MG CAPS Take 50 mg by mouth daily.  Marland Kitchen HYDROcodone-homatropine (HYCODAN) 5-1.5 MG/5ML syrup Take 5 mLs by mouth 4 (four) times daily as needed for cough.  . Insulin Glargine, 1 Unit Dial, (TOUJEO SOLOSTAR) 300 UNIT/ML SOPN Inject 40 Units into the skin daily.  . insulin lispro (HUMALOG) 100 UNIT/ML KwikPen Inject 10 Units into the skin daily. 15 minutes before meals  . loratadine (CLARITIN) 10 MG tablet Take 10 mg by mouth daily.  . magnesium oxide (MAG-OX) 400 MG tablet Take 400 mg by mouth daily.  . metolazone (ZAROXOLYN) 5 MG tablet Take 30 minutes before first Torsemide dose  . Multiple Vitamin (MULTIVITAMIN) capsule Take 1 capsule by mouth daily. Unknown strength per patient  . nitroGLYCERIN (NITROLINGUAL) 0.4 MG/SPRAY spray Place 1 spray under the tongue as needed for chest pain.  . Omega-3 Fatty Acids (FISH OIL) 1000 MG CAPS Take 1 capsule by mouth daily.  Marland Kitchen omeprazole (PRILOSEC) 40 MG capsule Take 1 capsule by mouth daily.  . Probiotic Product (PROBIOTIC & ACIDOPHILUS EX ST PO) Take 1 capsule by mouth daily.  . rosuvastatin (CRESTOR) 40 MG tablet Take 40 mg by mouth daily.  . sacubitril-valsartan (ENTRESTO) 24-26 MG Take 1 tablet by mouth daily.  . Tiotropium Bromide Monohydrate (SPIRIVA RESPIMAT) 1.25 MCG/ACT AERS Inhale 2 puffs into the lungs daily.  Marland Kitchen torsemide (DEMADEX) 20 MG tablet Take 0.5 tablets (10 mg total) by mouth 2 (two) times daily.  . [DISCONTINUED] furosemide (LASIX) 40 MG tablet Take 80 mg by mouth daily. Patient went to '80mg'$  per her choice     Allergies:   Tea, Ezetimibe-simvastatin, and Pitocin [oxytocin]    Social History   Socioeconomic History  . Marital status: Married    Spouse name: Not on file  . Number of children: Not on file  . Years of education: Not on file  . Highest education level: Not on file  Occupational History  . Not on file  Tobacco Use  . Smoking status: Never Smoker  . Smokeless tobacco: Never Used  Vaping Use  . Vaping Use: Never used  Substance and Sexual Activity  . Alcohol use: Never  . Drug use: Never  . Sexual activity: Not on file  Other Topics Concern  . Not on file  Social History Narrative  . Not on file   Social Determinants of Health   Financial Resource Strain: Not on file  Food Insecurity: Not on file  Transportation Needs: Not on file  Physical Activity: Not on file  Stress: Not on file  Social Connections: Not on file     Family History: [name]; Aneurysm in  her father; Breast cancer in her mother and sister; Diabetes in her sister, sister, and sister; Hypertension in her mother; Kidney failure in her mother; Stroke in her mother.  ROS:   Review of Systems  Constitution: Negative for decreased appetite, fever and weight gain.  HENT: Negative for congestion, ear discharge, hoarse voice and sore throat.   Eyes: Negative for discharge, redness, vision loss in right eye and visual halos.  Cardiovascular: Reports significant leg swelling.  Negative for chest pain, dyspnea on exertion,, orthopnea and palpitations.  Respiratory: Negative for cough, hemoptysis, shortness of breath and snoring.   Endocrine: Negative for heat intolerance and polyphagia.  Hematologic/Lymphatic: Negative for bleeding problem. Does not bruise/bleed easily.  Skin: Negative for flushing, nail changes, rash and suspicious lesions.  Musculoskeletal: Negative for arthritis, joint pain, muscle cramps, myalgias, neck pain and stiffness.  Gastrointestinal: Negative for abdominal pain, bowel incontinence,  diarrhea and excessive appetite.  Genitourinary: Negative for decreased libido, genital sores and incomplete emptying.  Neurological: Negative for brief paralysis, focal weakness, headaches and loss of balance.  Psychiatric/Behavioral: Negative for altered mental status, depression and suicidal ideas.  Allergic/Immunologic: Negative for HIV exposure and persistent infections.    EKGs/Labs/Other Studies Reviewed:    The following studies were reviewed today:   EKG: None today   Echocardiogram done November 21, 2020 LeftVentricle: There is mild concentric hypertrophy.  . LeftVentricle: Systolic function is mildly abnormal. EF: 45-50%.  . LeftVentricle: Doppler parameters consistent with moderate diastolic  dysfunction and elevated LA pressure.  . TricuspidValve: There is mild regurgitation.  . TricuspidValve: The right ventricular systolic pressure is mildly  elevated (37-49 mmHg).   Moderate biatrial enlargement.  Since previous study essentially unchanged except improvement in PA  pressures  Narrative Performed by CPACS This result has an attachment that is not available.  .  Left Ventricle  Left ventricle size is normal. The calculated left ventricular ejection fraction is 45%. There is mild concentric hypertrophy. Systolic function is mildly abnormal. EF: 45-50%. Hypokinesis of the left ventricle. Doppler parameters consistent with moderate diastolic dysfunction and elevated LA pressure.   Right Ventricle  Right ventricle is borderline dilated. Systolic function is mildly reduced. Abnormal tricuspid annular plane systolic excursion (TAPSE) <1.7 cm.   Left Atrium  Leftatrium is moderately dilated. Left atrium volume index is moderately increased (42-48 mL/m2). Atrial septum appears intact.   Right Atrium  Right atrium is borderline dilated.   IVC/SVC  The inferior vena cava demonstrates a diameter of >2.1 cm and collapses <50%; therefore, the right atrial pressure  is estimated at 15 mmHg.   Mitral Valve  The leaflets are mildly thickened. There is annular calcification. There is trace regurgitation.   Tricuspid Valve  Tricuspid valve structure is normal. There is mild regurgitation. The right ventricular systolic pressure is mildly elevated (37-49 mmHg).   Aortic Valve  The aortic valve is tricuspid. There is sclerosis. There is no regurgitation or stenosis.   Pulmonic Valve  The pulmonic valve was not well visualized. Trace regurgitation.   Ascending Aorta  The aortic root is normal in size.   Pericardium  There is no pericardial effusion.   Study Details  A complete echo was performed using complete 2D, color flow Doppler and spectral Doppler. Patient has normal sinus rhythm. Overall the study quality was adequate. The study was technically difficult. The study was difficult due to patient's body habitus. Mild change noted when compared to the previous study performed on 06/19/2019.   Wall Scoring Baseline  Score Index: 2.00    Recent Labs: 12/27/2019: NT-Pro BNP 2,515 10/21/2020: BUN 32; Creatinine, Ser 1.29; Potassium 3.7; Sodium 141  Recent Lipid Panel No results found for: CHOL, TRIG, HDL, CHOLHDL, VLDL, LDLCALC, LDLDIRECT  Physical Exam:    VS:  There were no vitals taken for this visit.    Wt Readings from Last 3 Encounters:  10/21/20 165 lb (74.8 kg)  04/22/20 150 lb (68 kg)  02/13/20 148 lb 6.4 oz (67.3 kg)     GEN: Well nourished, well developed in no acute distress HEENT: Normal NECK: No JVD; No carotid bruits LYMPHATICS: No lymphadenopathy CARDIAC: S1S2 noted,RRR, no murmurs, rubs, gallops RESPIRATORY:  Clear to auscultation without rales, wheezing or rhonchi  ABDOMEN: Soft, non-tender, non-distended, +bowel sounds, no guarding. EXTREMITIES: +3 up to knee bilateral lower extremity edema, No cyanosis, no clubbing MUSCULOSKELETAL:  No deformity  SKIN: Warm and dry NEUROLOGIC:  Alert and oriented x 3,  non-focal PSYCHIATRIC:  Normal affect, good insight  ASSESSMENT:    1. Congestive heart failure, unspecified HF chronicity, unspecified heart failure type ([place])   2. Hypertension, unspecified type    PLAN:    She is here today with her daughter in the office.  The patient does have significant bilateral leg edema.  She is now up to Lasix 80 mg daily.  I am hoping that diuretic resistance in the Lasix is not playing a role here.  I am going to stop the Lasix today.  Start the patient on torsemide 10 mg twice a day for 5 days, and then 10 mg daily.  In the meantime for effective diuresis metolazone 5 mg for 3 days will be given and I instructed the patient to take this 30 minutes prior to her first torsemide dosing.  I am hoping this will be able to help with effective diuresis and keep the patient out of the hospital.  Blood work will be done today for BMP and mag.  The patient is in agreement with the above plan. The patient left the office in stable condition.  The patient will follow up in 2 weeks Dr. Barbette Or.   Medication Adjustments/Labs and Tests Ordered: Current medicines are reviewed at length with the patient today.  Concerns regarding medicines are outlined above.  Orders Placed This Encounter  Procedures  . Basic metabolic panel  . Magnesium   Meds ordered this encounter  Medications  . torsemide (DEMADEX) 20 MG tablet    Sig: Take 0.5 tablets (10 mg total) by mouth 2 (two) times daily.    Dispense:  90 tablet    Refill:  3  . metolazone (ZAROXOLYN) 5 MG tablet    Sig: Take 30 minutes before first Torsemide dose    Dispense:  3 tablet    Refill:  0    Patient Instructions  Medication Instructions:  Your physician has recommended you make the following change in your medication:  STOP: Lasix (Furosemide) START: Torsemide 10 mg twice daily START: Metozolne 5 mg 30 minutes before first dose of Torsemide for 3 days  *If you need a refill on your cardiac medications  before your next appointment, please call your pharmacy*   Lab Work: Your physician recommends that you return for lab work: TODAY: BMET, [place] If you have labs (blood work) drawn today and your tests are completely normal, you will receive your results only by: Marland Kitchen MyChart Message (if you have MyChart) OR . A paper copy in the mail If you have any lab  test that is abnormal or we need to change your treatment, we will call you to review the results.   Testing/Procedures: None   Follow-Up: At [facility], you and your health needs are our priority.  As part of our continuing mission to provide you with exceptional heart care, we have created designated Provider Care Teams.  These Care Teams include your primary Cardiologist (physician) and Advanced Practice Providers (APPs -  Physician Assistants and Nurse Practitioners) who all work together to provide you with the care you need, when you need it.  We recommend signing up for the patient portal called "MyChart".  Sign up information is provided on this After Visit Summary.  MyChart is used to connect with patients for Virtual Visits (Telemedicine).  Patients are able to view lab/test results, encounter notes, upcoming appointments, etc.  Non-urgent messages can be sent to your provider as well.   To learn more about what you can do with MyChart, go to [email or web address].    Your next appointment:   2 week(s)  The format for your next appointment:   In Person  Provider:   [name], MD   Other Instructions     Adopting a Healthy Lifestyle.  Know what a healthy weight is for you (roughly BMI <25) and aim to maintain this   Aim for 7+ servings of fruits and vegetables daily   65-80+ fluid ounces of water or unsweet tea for healthy kidneys   Limit to max 1 drink of alcohol per day; avoid smoking/tobacco   Limit animal fats in diet for cholesterol and heart health - choose grass fed whenever available   Avoid  highly processed foods, and foods high in saturated/trans fats   Aim for low stress - take time to unwind and care for your mental health   Aim for 150 min of moderate intensity exercise weekly for heart health, and weights twice weekly for bone health   Aim for 7-9 hours of sleep daily   When it comes to diets, agreement about the perfect plan isnt easy to find, even among the experts. Experts at the [employer] developed an idea known as the Healthy Eating Plate. Just imagine a plate divided into logical, healthy portions.   The emphasis is on diet quality:   Load up on vegetables and fruits - one-half of your plate: Aim for color and variety, and remember that potatoes dont count.   Go for whole grains - one-quarter of your plate: Whole wheat, barley, wheat berries, quinoa, oats, brown rice, and foods made with them. If you want pasta, go with whole wheat pasta.   Protein power - one-quarter of your plate: Fish, chicken, beans, and nuts are all healthy, versatile protein sources. Limit red meat.   The diet, however, does go beyond the plate, offering a few other suggestions.   Use healthy plant oils, such as olive, canola, soy, corn, sunflower and peanut. Check the labels, and avoid partially hydrogenated oil, which have unhealthy trans fats.   If youre thirsty, drink water. Coffee and tea are good in moderation, but skip sugary drinks and limit milk and dairy products to one or two daily servings.   The type of carbohydrate in the diet is more important than the amount. Some sources of carbohydrates, such as vegetables, fruits, whole grains, and beans-are healthier than others.   Finally, stay active  Signed, Berniece Salines, DO  12/01/2020 12:05 PM    [place]

## 2020-12-01 NOTE — Patient Instructions (Addendum)
Medication Instructions:  Your physician has recommended you make the following change in your medication:  STOP: Lasix (Furosemide) START: Torsemide 10 mg twice daily START: Metozolne 5 mg 30 minutes before first dose of Torsemide for 3 days  *If you need a refill on your cardiac medications before your next appointment, please call your pharmacy*   Lab Work: Your physician recommends that you return for lab work: TODAY: BMET, [place] If you have labs (blood work) drawn today and your tests are completely normal, you will receive your results only by: Marland Kitchen MyChart Message (if you have MyChart) OR . A paper copy in the mail If you have any lab test that is abnormal or we need to change your treatment, we will call you to review the results.   Testing/Procedures: None   Follow-Up: At [facility], you and your health needs are our priority.  As part of our continuing mission to provide you with exceptional heart care, we have created designated Provider Care Teams.  These Care Teams include your primary Cardiologist (physician) and Advanced Practice Providers (APPs -  Physician Assistants and Nurse Practitioners) who all work together to provide you with the care you need, when you need it.  We recommend signing up for the patient portal called "MyChart".  Sign up information is provided on this After Visit Summary.  MyChart is used to connect with patients for Virtual Visits (Telemedicine).  Patients are able to view lab/test results, encounter notes, upcoming appointments, etc.  Non-urgent messages can be sent to your provider as well.   To learn more about what you can do with MyChart, go to [email or web address].    Your next appointment:   2 week(s)  The format for your next appointment:   In Person  Provider:   [name], MD   Other Instructions

## 2020-12-02 LAB — BASIC METABOLIC PANEL
BUN/Creatinine Ratio: 30 — ABNORMAL HIGH (ref 12–28)
BUN: 35 mg/dL — ABNORMAL HIGH (ref 8–27)
CO2: 24 mmol/L (ref 20–29)
Calcium: 8.9 mg/dL (ref 8.7–10.3)
Chloride: 100 mmol/L (ref 96–106)
Creatinine, Ser: 1.17 mg/dL — ABNORMAL HIGH (ref 0.57–1.00)
Glucose: 233 mg/dL — ABNORMAL HIGH (ref 65–99)
Potassium: 3.9 mmol/L (ref 3.5–5.2)
Sodium: 139 mmol/L (ref 134–144)
eGFR: 51 mL/min/{1.73_m2} — ABNORMAL LOW (ref >59)

## 2020-12-02 LAB — MAGNESIUM: Magnesium: 2.1 mg/dL (ref 1.6–2.3)

## 2020-12-10 ENCOUNTER — Telehealth: Payer: Self-pay

## 2020-12-10 NOTE — Telephone Encounter (Signed)
   [facility] Pre-operative Risk Assessment    Request for surgical clearance:  1. What type of surgery is being performed? Intravitreal Injection  Of 12.5 Avastin in the right eye  2. When is this surgery scheduled? TBD   3. What type of clearance is required (medical clearance vs. Pharmacy clearance to hold med vs. Both)?Medical  4. Are there any medications that need to be held prior to surgery and how long?Not specified    5. Practice name and name of physician performing surgery? Dr. Randell Loop at [facility]  6. What is your office phone number: [phone]    7.   What is your office fax number: [phone]  8.   Anesthesia type (None, local, MAC, general) ? Not specified    Alexandra Baker Alexandra Baker 12/10/2020, 3:47 PM  _________________________________________________________________   (provider comments below)

## 2020-12-11 NOTE — Telephone Encounter (Signed)
   Name: Alexandra Baker  DOB: [birth year]  MRN: [id number]  Primary Cardiologist: [name], MD  Chart reviewed as part of pre-operative protocol coverage. Because of [name]'s past medical history and time since last visit, she will require a follow-up visit in order to better assess preoperative cardiovascular risk.  She was last seen 12/01/20 due to volume overload and transitioned from Lasix to Torsemide. She has upcoming appointment 12/18/20 with Dr. Agustin Cree and clearance can be addressed at that time. Appointment notes have been updated.   I will remove from preop box. Will route update to requesting party via epic fax function.   Loel Dubonnet, NP  12/11/2020, 12:42 PM

## 2020-12-14 ENCOUNTER — Other Ambulatory Visit: Payer: Self-pay

## 2020-12-18 ENCOUNTER — Encounter: Payer: Self-pay | Admitting: Cardiology

## 2020-12-18 ENCOUNTER — Other Ambulatory Visit: Payer: Self-pay

## 2020-12-18 ENCOUNTER — Ambulatory Visit: Payer: Medicare Other | Admitting: Cardiology

## 2020-12-18 ENCOUNTER — Other Ambulatory Visit: Payer: Self-pay | Admitting: Cardiology

## 2020-12-18 VITALS — BP 160/90 | HR 74 | Ht <= 58 in | Wt 165.0 lb

## 2020-12-18 DIAGNOSIS — I5042 Chronic combined systolic (congestive) and diastolic (congestive) heart failure: Secondary | ICD-10-CM

## 2020-12-18 DIAGNOSIS — I255 Ischemic cardiomyopathy: Secondary | ICD-10-CM | POA: Diagnosis not present

## 2020-12-18 DIAGNOSIS — E782 Mixed hyperlipidemia: Secondary | ICD-10-CM

## 2020-12-18 DIAGNOSIS — I10 Essential (primary) hypertension: Secondary | ICD-10-CM

## 2020-12-18 DIAGNOSIS — R0602 Shortness of breath: Secondary | ICD-10-CM

## 2020-12-18 DIAGNOSIS — Z951 Presence of aortocoronary bypass graft: Secondary | ICD-10-CM

## 2020-12-18 NOTE — Patient Instructions (Signed)
Medication Instructions:  Your physician recommends that you continue on your current medications as directed. Please refer to the Current Medication list given to you today.  *If you need a refill on your cardiac medications before your next appointment, please call your pharmacy*   Lab Work: Your physician recommends that you return for lab work in: TODAY BMP, ProBNP If you have labs (blood work) drawn today and your tests are completely normal, you will receive your results only by: Marland Kitchen MyChart Message (if you have MyChart) OR . A paper copy in the mail If you have any lab test that is abnormal or we need to change your treatment, we will call you to review the results.   Testing/Procedures: None   Follow-Up: At [facility], you and your health needs are our priority.  As part of our continuing mission to provide you with exceptional heart care, we have created designated Provider Care Teams.  These Care Teams include your primary Cardiologist (physician) and Advanced Practice Providers (APPs -  Physician Assistants and Nurse Practitioners) who all work together to provide you with the care you need, when you need it.  We recommend signing up for the patient portal called "MyChart".  Sign up information is provided on this After Visit Summary.  MyChart is used to connect with patients for Virtual Visits (Telemedicine).  Patients are able to view lab/test results, encounter notes, upcoming appointments, etc.  Non-urgent messages can be sent to your provider as well.   To learn more about what you can do with MyChart, go to [email or web address].    Your next appointment:   4 week(s)  The format for your next appointment:   In Person  Provider:   [name], MD   Other Instructions

## 2020-12-18 NOTE — Progress Notes (Signed)
Cardiology Office Note:    Date:  12/18/2020   ID:  Alexandra Baker, DOB [birth year], MRN [id number]  PCP:  Alma Friendly, MD  Cardiologist:  [name], MD    Referring MD: Alma Friendly, MD   Chief Complaint  Patient presents with  . Follow-up  Am doing better  History of Present Illness:    Alexandra Baker is a 71 y.o. female with past medical history significant for coronary artery disease, status post coronary bypass grafting 1999 in [facility], cardiomyopathy with ejection fraction 40 to 45%, latest echocardiogram however showed improvement left ventricle ejection fraction to 45 to 50%, mild pulmonary hypertension with mild mitral regurgitation, moderate biatrial enlargement, essential hypertension. Recently she ended going to [facility] because of decompensated CHF she did have significant swelling of lower extremities that was managed appropriately and then she was seen by Dr. Harriet Masson in my office.  She was given torsemide as well as Zaroxolyn.  With some response.  Her weight is unchanged however swelling of lower extremities much better.  She denies have any chest pain tightness squeezing pressure burning chest no palpitations no dizziness.  Past Medical History:  Diagnosis Date  . Age-related osteoporosis without current pathological fracture 06/23/2020   Formatting of this note might be different from the original. Bone density January 2021 T score -2.6 to right hip -1.1 to lumbar spine patient refusing treatment  . Asthmatic bronchitis without complication 123XX123   Managed by dr. Alcide Clever  . Benign essential hypertension 05/11/2016  . Cardiomyopathy ([place]) ejection fraction 4045% in November 2020 07/18/2019  . Cellulitis of left lower extremity 05/29/2019  . Chronic combined systolic and diastolic CHF (congestive heart failure) ([place]) 12/31/2019   Seeing cards in [place]  . Congestive heart failure ([place]) New York Heart Association class  III 07/18/2019  . Coronary disease 07/18/2019  . Dyslipidemia 12/25/2019  . GAD (generalized anxiety disorder) 07/18/2019  . GERD (gastroesophageal reflux disease) 07/18/2019  . Heme positive stool 06/20/2018   Refuses colonoscopy  . Hyperlipidemia 05/11/2016  . Hypertension 07/18/2019  . Ischemic cardiomyopathy 12/31/2019  . Medically noncompliant 09/12/2018  . Memory loss 06/20/2018   5/5 mini cog refuses neuro eval Since heart surgery 11/19  . Mild intermittent asthma without complication 123XX123   Managed by dr. Alcide Clever  . Stage 2 chronic kidney disease 02/14/2017  . Status post coronary artery bypass graft 1999 done at [facility] 07/18/2019  . Tremor 06/20/2018   Declines neuro eval  . Uncontrolled type 2 diabetes mellitus with hyperglycemia ([place]) 05/11/2016  . Uncontrolled type 2 diabetes mellitus with microalbuminuria, with long-term current use of insulin ([place]) 07/18/2019   Managed by dr. Tamala Julian endo exclusively  Pt noncompliant with meds and ov    Past Surgical History:  Procedure Laterality Date  . CARPAL TUNNEL RELEASE    . CESAREAN SECTION    . CHOLECYSTECTOMY    . CORONARY ARTERY BYPASS GRAFT    . DILATION AND CURETTAGE OF UTERUS    . WRIST FRACTURE SURGERY      Current Medications: Current Meds  Medication Sig  . albuterol (VENTOLIN HFA) 108 (90 Base) MCG/ACT inhaler Inhale 1 puff into the lungs every 6 (six) hours as needed for shortness of breath or wheezing.  Marland Kitchen aspirin EC 81 MG tablet Take 81 mg by mouth daily. Swallow whole.  . B Complex-C (SUPER B COMPLEX PO) Take 1 tablet by mouth daily. Unknown strength  . BESIVANCE 0.6 % SUSP Place 1 drop  into the left eye 3 (three) times daily.  . budesonide-formoterol (SYMBICORT) 160-4.5 MCG/ACT inhaler Inhale 1 puff into the lungs daily as needed (Shortness of breath).  . Calcium-Magnesium-Vitamin D (CALCIUM MAGNESIUM PO) Take 1 tablet by mouth daily. Unknown strength  . carvedilol (COREG) 6.25 MG  tablet Take 6.25 mg by mouth 2 (two) times daily with a meal.  . cholecalciferol (VITAMIN D3) 25 MCG (1000 UNIT) tablet Take 1,000 Units by mouth daily.  . Coenzyme Q10 50 MG CAPS Take 50 mg by mouth daily.  . DUREZOL 0.05 % EMUL Place 1 drop into the left eye 3 (three) times daily.  Marland Kitchen HYDROcodone bit-homatropine (HYCODAN) 5-1.5 MG/5ML syrup Take 5 mLs by mouth 4 (four) times daily as needed.  . Insulin Glargine, 1 Unit Dial, (TOUJEO SOLOSTAR) 300 UNIT/ML SOPN Inject 40 Units into the skin daily.  . insulin lispro (HUMALOG) 100 UNIT/ML KwikPen Inject 10 Units into the skin daily. 15 minutes before meals  . loratadine (CLARITIN) 10 MG tablet Take 10 mg by mouth daily.  . magnesium oxide (MAG-OX) 400 MG tablet Take 400 mg by mouth daily.  . metolazone (ZAROXOLYN) 5 MG tablet Take 30 minutes before first Torsemide dose  . Multiple Vitamin (MULTIVITAMIN) capsule Take 1 capsule by mouth daily. Unknown strength per patient  . nitroGLYCERIN (NITROLINGUAL) 0.4 MG/SPRAY spray Place 1 spray under the tongue as needed for chest pain.  . Omega-3 Fatty Acids (FISH OIL) 1000 MG CAPS Take 1 capsule by mouth daily.  Marland Kitchen omeprazole (PRILOSEC) 40 MG capsule Take 1 capsule by mouth daily.  . Probiotic Product (PROBIOTIC & ACIDOPHILUS EX ST PO) Take 1 capsule by mouth daily.  Marland Kitchen PROLENSA 0.07 % SOLN Place 1 drop into the left eye at bedtime.  . rosuvastatin (CRESTOR) 40 MG tablet Take 40 mg by mouth daily.  . sacubitril-valsartan (ENTRESTO) 24-26 MG Take 1 tablet by mouth daily.  . Tiotropium Bromide Monohydrate (SPIRIVA RESPIMAT) 1.25 MCG/ACT AERS Inhale 2 puffs into the lungs daily.  Marland Kitchen torsemide (DEMADEX) 20 MG tablet Take 0.5 tablets (10 mg total) by mouth 2 (two) times daily.     Allergies:   Tea, Ezetimibe-simvastatin, and Pitocin [oxytocin]   Social History   Socioeconomic History  . Marital status: Married    Spouse name: Not on file  . Number of children: Not on file  . Years of education: Not on file   . Highest education level: Not on file  Occupational History  . Not on file  Tobacco Use  . Smoking status: Never Smoker  . Smokeless tobacco: Never Used  Vaping Use  . Vaping Use: Never used  Substance and Sexual Activity  . Alcohol use: Never  . Drug use: Never  . Sexual activity: Not on file  Other Topics Concern  . Not on file  Social History Narrative  . Not on file   Social Determinants of Health   Financial Resource Strain: Not on file  Food Insecurity: Not on file  Transportation Needs: Not on file  Physical Activity: Not on file  Stress: Not on file  Social Connections: Not on file     Family History: [name]; Aneurysm in her father; Breast cancer in her mother and sister; Diabetes in her sister, sister, and sister; Hypertension in her mother; Kidney failure in her mother; Stroke in her mother. ROS:   Please see the history of present illness.    All 14 point review of systems  negative except as described per history of present illness  EKGs/Labs/Other Studies Reviewed:      Recent Labs: 12/27/2019: NT-Pro BNP 2,515 12/01/2020: BUN 35; Creatinine, Ser 1.17; Magnesium 2.1; Potassium 3.9; Sodium 139  Recent Lipid Panel No results found for: CHOL, TRIG, HDL, CHOLHDL, VLDL, LDLCALC, LDLDIRECT  Physical Exam:    VS:  BP (!) 160/90 (BP Location: Left Arm, Patient Position: Sitting, Cuff Size: Normal)   Pulse 74   Ht 4' 9.5" (1.461 m)   Wt 165 lb (74.8 kg)   SpO2 97%   BMI 35.09 kg/m     Wt Readings from Last 3 Encounters:  12/18/20 165 lb (74.8 kg)  10/21/20 165 lb (74.8 kg)  04/22/20 150 lb (68 kg)     GEN:  Well nourished, well developed in no acute distress HEENT: Normal NECK: No JVD; No carotid bruits LYMPHATICS: No lymphadenopathy CARDIAC: RRR, no murmurs, no rubs, no gallops RESPIRATORY:  Clear to auscultation without rales, wheezing or rhonchi  ABDOMEN: Soft, non-tender,  non-distended MUSCULOSKELETAL:  No edema; No deformity  SKIN: Warm and dry LOWER EXTREMITIES: no swelling NEUROLOGIC:  Alert and oriented x 3 PSYCHIATRIC:  Normal affect   ASSESSMENT:    1. Primary hypertension   2. Shortness of breath   3. Chronic combined systolic and diastolic CHF (congestive heart failure) ([place])   4. Ischemic cardiomyopathy   5. Mixed hyperlipidemia   6. Status post coronary artery bypass graft 1999 done at [facility]    PLAN:    In order of problems listed above:  1. Congestive heart failure.  Still minimally decompensated.  I will check Chem-7 proBNP to decide about future therapy.  In the meantime we will continue torsemide.  She is out of Zaroxolyn. 2. Essential hypertension which is uncontrolled today.  Will wait for Chem-7 to be back in order to decide which way to go IDL way will be to increase dose of Entresto however if her kidney dysfunction is still present or worse then she may require vasodilatation with hydralazine and isosorbide to help with the blood pressure as well as congestive heart failure. 3. Mixed dyslipidemia, she is taking Crestor 40 which I will continue, we will make arrangements for fasting lipid profile to be repeated. 4. Diabetes mellitus.  I do not have any latest hemoglobin A1c.  She tells me it is relatively well controlled.  I see her my office relatively quickly to make sure I understand what is going on.   Medication Adjustments/Labs and Tests Ordered: Current medicines are reviewed at length with the patient today.  Concerns regarding medicines are outlined above.  Orders Placed This Encounter  Procedures  . Basic metabolic panel  . Pro b natriuretic peptide (BNP)  . EKG 12-Lead   Medication changes: No orders of the defined types were placed in this encounter.   Signed, Park Liter, MD, [facility] 12/18/2020 9:11 AM    [place]

## 2020-12-19 LAB — BASIC METABOLIC PANEL
BUN/Creatinine Ratio: 35 — ABNORMAL HIGH (ref 12–28)
BUN: 45 mg/dL — ABNORMAL HIGH (ref 8–27)
CO2: 24 mmol/L (ref 20–29)
Calcium: 9.3 mg/dL (ref 8.7–10.3)
Chloride: 99 mmol/L (ref 96–106)
Creatinine, Ser: 1.28 mg/dL — ABNORMAL HIGH (ref 0.57–1.00)
Glucose: 261 mg/dL — ABNORMAL HIGH (ref 65–99)
Potassium: 4.6 mmol/L (ref 3.5–5.2)
Sodium: 140 mmol/L (ref 134–144)
eGFR: 46 mL/min/{1.73_m2} — ABNORMAL LOW (ref >59)

## 2020-12-19 LAB — PRO B NATRIURETIC PEPTIDE: NT-Pro BNP: 2318 pg/mL — ABNORMAL HIGH (ref 0–301)

## 2020-12-21 ENCOUNTER — Telehealth: Payer: Self-pay

## 2020-12-21 NOTE — Telephone Encounter (Signed)
   [facility] Pre-operative Risk Assessment    Request for surgical clearance:  1. What type of surgery is being performed? Intravitreal Injection 1.73m Avastin R eye   2. When is this surgery scheduled? TBD   3. What type of clearance is required (medical clearance vs. Pharmacy clearance to hold med vs. Both)? Both  4. Are there any medications that need to be held prior to surgery and how long?Not specified however the patient is on ASA  852m 5. Practice name and name of physician performing surgery? Dr. KoRandell Loopt CeDoran 6. What is your office phone number: [phone]  7.   What is your office fax number: [phone].   Anesthesia type (None, local, MAC, general) ? Not specified    JaBasil Dessrevatt 12/21/2020, 4:03 PM  _________________________________________________________________   (provider comments below)

## 2020-12-21 NOTE — Telephone Encounter (Signed)
Pt has appt with Dr. Agustin Cree 6/28 for f/u on CHF.  Please contact surgeon and ask if ok to wait until 6/28 when she sees Dr. Agustin Cree to get clearance.  Or is surgery more urgent?  Richardson Dopp, PA-C    12/21/2020 5:46 PM

## 2020-12-22 ENCOUNTER — Telehealth: Payer: Self-pay | Admitting: Emergency Medicine

## 2020-12-22 MED ORDER — HYDRALAZINE HCL 10 MG PO TABS: 10.0000 mg | ORAL_TABLET | Freq: Three times a day (TID) | ORAL | 1 refills | Status: DC

## 2020-12-22 NOTE — Telephone Encounter (Signed)
Spoke with surgeon's office.  She stated that Alexandra Baker had an appointment with Dr. Agustin Cree 12/18/2020.  I will route back to the preop pool to make them aware.

## 2020-12-22 NOTE — Telephone Encounter (Signed)
   Name: Alexandra Baker DOB: 1951-03-25  MRN: [id number]  Primary Cardiologist: [name], MD  Chart reviewed as part of pre-operative protocol coverage.   70 y.o. female with . Coronary artery disease  . S/p CABG in 1999 . HFmrEF (heart failure with mildly reduced ejection fraction)  . Echocardiogram 4/22: EF 45-50, no sig valve dz . Hypertension  . Hyperlipidemia  . Chronic kidney disease  . Insulin Dependent Diabetes Mellitus  . Asthmatic bronchitis   Last OV:  12/18/20 with Dr. Agustin Cree  Procedure:  Intravitreal injection  Rx:  No request  I called requesting surgeon's office.  The ocular injection that is needed is a low risk procedure that requires local anesthesia.   Patient was contacted 12/22/2020 in reference to pre-operative risk assessment for pending surgery as outlined below.  Since last seen, Alexandra Baker has not had any change in her symptoms.  She has not had chest pain or worsening shortness of breath.  Recommendations: . Based on ACC/AHA guidelines, the patient is at acceptable risk for the planned procedure and may proceed without further cardiovascular testing.  . The pt should remain on ASA without interruption.  Please call with questions. Richardson Dopp, PA-C 12/22/2020, 8:49 AM

## 2020-12-22 NOTE — Telephone Encounter (Signed)
Notes faxed to surgeon. This phone note will be removed from the preop pool. Richardson Dopp, PA-C  12/22/2020 9:22 AM

## 2020-12-22 NOTE — Telephone Encounter (Signed)
-----   Message from Park Liter, MD sent at 12/22/2020 10:30 AM EDT ----- Please start hydralazine 10 mg 3 times daily

## 2020-12-24 ENCOUNTER — Other Ambulatory Visit: Payer: Self-pay | Admitting: Cardiology

## 2020-12-25 NOTE — Telephone Encounter (Signed)
Rx approved and sent 

## 2020-12-30 DIAGNOSIS — J449 Chronic obstructive pulmonary disease, unspecified: Secondary | ICD-10-CM | POA: Insufficient documentation

## 2020-12-30 HISTORY — DX: Morbid (severe) obesity due to excess calories: E66.01

## 2021-01-25 ENCOUNTER — Other Ambulatory Visit: Payer: Self-pay

## 2021-01-26 ENCOUNTER — Encounter: Payer: Self-pay | Admitting: Cardiology

## 2021-01-26 ENCOUNTER — Other Ambulatory Visit: Payer: Self-pay

## 2021-01-26 ENCOUNTER — Ambulatory Visit: Payer: Medicare Other | Admitting: Cardiology

## 2021-01-26 VITALS — BP 180/90 | HR 60 | Ht <= 58 in | Wt 166.0 lb

## 2021-01-26 DIAGNOSIS — I5042 Chronic combined systolic (congestive) and diastolic (congestive) heart failure: Secondary | ICD-10-CM

## 2021-01-26 DIAGNOSIS — I10 Essential (primary) hypertension: Secondary | ICD-10-CM | POA: Diagnosis not present

## 2021-01-26 DIAGNOSIS — E1165 Type 2 diabetes mellitus with hyperglycemia: Secondary | ICD-10-CM

## 2021-01-26 DIAGNOSIS — IMO0002 Reserved for concepts with insufficient information to code with codable children: Secondary | ICD-10-CM

## 2021-01-26 DIAGNOSIS — I255 Ischemic cardiomyopathy: Secondary | ICD-10-CM | POA: Diagnosis not present

## 2021-01-26 DIAGNOSIS — R809 Proteinuria, unspecified: Secondary | ICD-10-CM

## 2021-01-26 DIAGNOSIS — E782 Mixed hyperlipidemia: Secondary | ICD-10-CM

## 2021-01-26 DIAGNOSIS — E1129 Type 2 diabetes mellitus with other diabetic kidney complication: Secondary | ICD-10-CM

## 2021-01-26 DIAGNOSIS — Z794 Long term (current) use of insulin: Secondary | ICD-10-CM

## 2021-01-26 NOTE — Patient Instructions (Signed)
Medication Instructions:  Your physician recommends that you continue on your current medications as directed. Please refer to the Current Medication list given to you today.  *If you need a refill on your cardiac medications before your next appointment, please call your pharmacy*   Lab Work: Your physician recommends that you return for lab work today : pro bnp, bmp   If you have labs (blood work) drawn today and your tests are completely normal, you will receive your results only by: MyChart Message (if you have MyChart) OR A paper copy in the mail If you have any lab test that is abnormal or we need to change your treatment, we will call you to review the results.   Testing/Procedures: None   Follow-Up: At [facility], you and your health needs are our priority.  As part of our continuing mission to provide you with exceptional heart care, we have created designated Provider Care Teams.  These Care Teams include your primary Cardiologist (physician) and Advanced Practice Providers (APPs -  Physician Assistants and Nurse Practitioners) who all work together to provide you with the care you need, when you need it.  We recommend signing up for the patient portal called "MyChart".  Sign up information is provided on this After Visit Summary.  MyChart is used to connect with patients for Virtual Visits (Telemedicine).  Patients are able to view lab/test results, encounter notes, upcoming appointments, etc.  Non-urgent messages can be sent to your provider as well.   To learn more about what you can do with MyChart, go to [email or web address].    Your next appointment:   2 month(s)  The format for your next appointment:   In Person  Provider:   [name], MD   Other Instructions

## 2021-01-26 NOTE — Progress Notes (Signed)
Cardiology Office Note:    Date:  01/26/2021   ID:  Alexandra Baker, DOB [birth year], MRN [id number]  PCP:  Alma Friendly, MD  Cardiologist:  [name], MD    Referring MD: Alma Friendly, MD   Chief Complaint  Patient presents with   Follow-up    History of Present Illness:    Alexandra Baker is a 70 y.o. female   with past medical history significant for coronary artery disease, status post coronary bypass grafting 1999 in [facility], cardiomyopathy with ejection fraction 40 to 45%, latest echocardiogram however showed improvement left ventricle ejection fraction to 45 to 50%, mild pulmonary hypertension with mild mitral regurgitation, moderate biatrial enlargement, essential hypertension. Recently she ended going to [facility] because of decompensated CHF she did have significant swelling of lower extremities that was managed appropriately and then she was seen by Dr. Harriet Masson in my office. Overall she says she is not doing well she complain having some swelling of lower extremities, not shortness of breath no paroxysmal nocturnal dyspnea.  She gained 3 pounds since I seen her last time.  Interesting couple days ago she fell down she tripped and fell down the right side and going to the emergency room.  Past Medical History:  Diagnosis Date   Age-related osteoporosis without current pathological fracture 06/23/2020   Formatting of this note might be different from the original. Bone density January 2021 T score -2.6 to right hip -1.1 to lumbar spine patient refusing treatment   Asthmatic bronchitis without complication 123XX123   Managed by dr. Alcide Clever   Benign essential hypertension 05/11/2016   Cardiomyopathy ([place]) ejection fraction 4045% in November 2020 07/18/2019   Cellulitis of left lower extremity 05/29/2019   Chronic combined systolic and diastolic CHF (congestive heart failure) ([place]) 12/31/2019   Seeing cards in [place]   Congestive  heart failure ([place]) New York Heart Association class III 07/18/2019   Coronary disease 07/18/2019   Dyslipidemia 12/25/2019   GAD (generalized anxiety disorder) 07/18/2019   GERD (gastroesophageal reflux disease) 07/18/2019   Heme positive stool 06/20/2018   Refuses colonoscopy   Hyperlipidemia 05/11/2016   Hypertension 07/18/2019   Ischemic cardiomyopathy 12/31/2019   Medically noncompliant 09/12/2018   Memory loss 06/20/2018   5/5 mini cog refuses neuro eval Since heart surgery 11/19   Mild intermittent asthma without complication 123XX123   Managed by dr. Alcide Clever   Severe obesity (BMI 35.0-39.9) with comorbidity ([place]) 12/30/2020   Stage 2 chronic kidney disease 02/14/2017   Stage 3b chronic kidney disease ([place]) 02/14/2017   Status post coronary artery bypass graft 1999 done at [facility] 07/18/2019   Tremor 06/20/2018   Declines neuro eval   Uncontrolled type 2 diabetes mellitus with hyperglycemia ([place]) 05/11/2016   Uncontrolled type 2 diabetes mellitus with microalbuminuria, with long-term current use of insulin ([place]) 07/18/2019   Managed by dr. Tamala Julian endo exclusively  Pt noncompliant with meds and ov    Past Surgical History:  Procedure Laterality Date   CARPAL TUNNEL RELEASE     CESAREAN SECTION     CHOLECYSTECTOMY     CORONARY ARTERY BYPASS GRAFT     DILATION AND CURETTAGE OF UTERUS     WRIST FRACTURE SURGERY      Current Medications: Current Meds  Medication Sig   albuterol (VENTOLIN HFA) 108 (90 Base) MCG/ACT inhaler Inhale 1 puff into the lungs every 6 (six) hours as needed for shortness of breath or wheezing.   aspirin EC 81  MG tablet Take 81 mg by mouth daily. Swallow whole.   B Complex-C (SUPER B COMPLEX PO) Take 1 tablet by mouth daily. Unknown strength   budesonide-formoterol (SYMBICORT) 160-4.5 MCG/ACT inhaler Inhale 1 puff into the lungs daily as needed (Shortness of breath).   Calcium-Magnesium-Vitamin D (CALCIUM MAGNESIUM PO) Take 1 tablet by  mouth daily. Unknown strength   carvedilol (COREG) 6.25 MG tablet TAKE ONE (1) TABLET BY MOUTH TWO (2) TIMES DAILY   cholecalciferol (VITAMIN D3) 25 MCG (1000 UNIT) tablet Take 1,000 Units by mouth daily.   Coenzyme Q10 50 MG CAPS Take 50 mg by mouth daily.   ENTRESTO 24-26 MG TAKE ONE (1) TABLET BY MOUTH TWO (2) TIMES DAILY   hydrALAZINE (APRESOLINE) 10 MG tablet Take 1 tablet (10 mg total) by mouth 3 (three) times daily.   Insulin Glargine, 1 Unit Dial, (TOUJEO SOLOSTAR) 300 UNIT/ML SOPN Inject 40 Units into the skin daily.   insulin lispro (HUMALOG) 100 UNIT/ML KwikPen Inject 10 Units into the skin daily. 15 minutes before meals   loratadine (CLARITIN) 10 MG tablet Take 10 mg by mouth daily.   magnesium oxide (MAG-OX) 400 MG tablet Take 400 mg by mouth daily.   Multiple Vitamin (MULTIVITAMIN) capsule Take 1 capsule by mouth daily. Unknown strength per patient   nitroGLYCERIN (NITROLINGUAL) 0.4 MG/SPRAY spray Place 1 spray under the tongue as needed for chest pain.   Omega-3 Fatty Acids (FISH OIL) 1000 MG CAPS Take 1 capsule by mouth daily.   omeprazole (PRILOSEC) 40 MG capsule Take 1 capsule by mouth daily.   Probiotic Product (PROBIOTIC & ACIDOPHILUS EX ST PO) Take 1 capsule by mouth daily.   rosuvastatin (CRESTOR) 40 MG tablet Take 40 mg by mouth daily.   Tiotropium Bromide Monohydrate (SPIRIVA RESPIMAT) 1.25 MCG/ACT AERS Inhale 2 puffs into the lungs daily.   torsemide (DEMADEX) 20 MG tablet Take 0.5 tablets (10 mg total) by mouth 2 (two) times daily.     Allergies:   Tea, Ezetimibe-simvastatin, and Pitocin [oxytocin]   Social History   Socioeconomic History   Marital status: Married    Spouse name: Not on file   Number of children: Not on file   Years of education: Not on file   Highest education level: Not on file  Occupational History   Not on file  Tobacco Use   Smoking status: Never   Smokeless tobacco: Never  Vaping Use   Vaping Use: Never used  Substance and Sexual  Activity   Alcohol use: Never   Drug use: Never   Sexual activity: Not on file  Other Topics Concern   Not on file  Social History Narrative   Not on file   Social Determinants of Health   Financial Resource Strain: Not on file  Food Insecurity: Not on file  Transportation Needs: Not on file  Physical Activity: Not on file  Stress: Not on file  Social Connections: Not on file     Family History: [name]; Aneurysm in her father; Breast cancer in her mother and sister; Diabetes in her sister, sister, and sister; Hypertension in her mother; Kidney failure in her mother; Stroke in her mother. ROS:   Please see the history of present illness.    All 14 point review of systems negative except as described per history of present illness  EKGs/Labs/Other Studies Reviewed:      Recent Labs: 12/01/2020: Magnesium 2.1 12/18/2020: BUN 45; Creatinine, Ser 1.28;  NT-Pro BNP 2,318; Potassium 4.6; Sodium 140  Recent Lipid Panel No results found for: CHOL, TRIG, HDL, CHOLHDL, VLDL, LDLCALC, LDLDIRECT  Physical Exam:    VS:  BP (!) 180/90 (BP Location: Right Arm, Patient Position: Sitting, Cuff Size: Normal)   Pulse 60   Ht 4' 9.5" (1.461 m)   Wt 166 lb (75.3 kg)   SpO2 96%   BMI 35.30 kg/m     Wt Readings from Last 3 Encounters:  01/26/21 166 lb (75.3 kg)  12/18/20 165 lb (74.8 kg)  10/21/20 165 lb (74.8 kg)     GEN:  Well nourished, well developed in no acute distress HEENT: Normal NECK: No JVD; No carotid bruits LYMPHATICS: No lymphadenopathy CARDIAC: RRR, no murmurs, no rubs, no gallops RESPIRATORY:  Clear to auscultation without rales, wheezing or rhonchi  ABDOMEN: Soft, non-tender, non-distended MUSCULOSKELETAL:  No edema; No deformity  SKIN: Warm and dry LOWER EXTREMITIES: 1+ swelling NEUROLOGIC:  Alert and oriented x 3 PSYCHIATRIC:  Normal affect   ASSESSMENT:    1. Chronic combined systolic and  diastolic congestive heart failure ([place])   2. Ischemic cardiomyopathy   3. Benign essential hypertension   4. Mixed hyperlipidemia   5. Uncontrolled type 2 diabetes mellitus with microalbuminuria, with long-term current use of insulin ([facility])    PLAN:    In order of problems listed above:  Congestive heart failure.  I will check proBNP as well as Chem-7 to see if I can add some medication to help with the congestion likely the only symptoms that they see is minimal weight gain as well as swelling of lower extremities Essential hypertension, clearly uncontrolled but before I change any medications I need to make sure her kidney function is fine which I will check with Chem-7. Diabetes she is wearing CGM.  She said her sugars sometimes high but overall trying to do the best she can to control it better. Mixed dyslipidemia, continue with statin   Medication Adjustments/Labs and Tests Ordered: Current medicines are reviewed at length with the patient today.  Concerns regarding medicines are outlined above.  Orders Placed This Encounter  Procedures   Pro b natriuretic peptide (BNP)   Basic metabolic panel   Medication changes: No orders of the defined types were placed in this encounter.   Signed, Park Liter, MD, [facility] 01/26/2021 4:16 PM    [facility]

## 2021-01-27 ENCOUNTER — Other Ambulatory Visit: Payer: Self-pay | Admitting: Cardiology

## 2021-01-27 LAB — BASIC METABOLIC PANEL
BUN/Creatinine Ratio: 21 (ref 12–28)
BUN: 25 mg/dL (ref 8–27)
CO2: 23 mmol/L (ref 20–29)
Calcium: 9.1 mg/dL (ref 8.7–10.3)
Chloride: 103 mmol/L (ref 96–106)
Creatinine, Ser: 1.21 mg/dL — ABNORMAL HIGH (ref 0.57–1.00)
Glucose: 258 mg/dL — ABNORMAL HIGH (ref 65–99)
Potassium: 4 mmol/L (ref 3.5–5.2)
Sodium: 140 mmol/L (ref 134–144)
eGFR: 49 mL/min/{1.73_m2} — ABNORMAL LOW (ref >59)

## 2021-01-27 LAB — PRO B NATRIURETIC PEPTIDE: NT-Pro BNP: 4942 pg/mL — ABNORMAL HIGH (ref 0–301)

## 2021-01-27 NOTE — Telephone Encounter (Signed)
Rx approved and sent 

## 2021-02-02 ENCOUNTER — Telehealth: Payer: Self-pay

## 2021-02-02 NOTE — Telephone Encounter (Signed)
   [facility] Pre-operative Risk Assessment    Request for surgical clearance:  What type of surgery is being performed? Macular Photocoagulation Laser   When is this surgery scheduled? TBD   What type of clearance is required (medical clearance vs. Pharmacy clearance to hold med vs. Both)? Medical  Are there any medications that need to be held prior to surgery and how long?Not specified however, she is on ASA 29m   Practice name and name of physician performing surgery? [facility]. [facility]   What is your office phone number: [phone]   7.   What is your office fax number: [phone].   Anesthesia type (None, local, MAC, general) ? Not specified    JBasil DessPrevatt 02/02/2021, 2:34 PM  _________________________________________________________________   (provider comments below)

## 2021-02-02 NOTE — Telephone Encounter (Signed)
Primary Cardiologist:Robert Agustin Cree, MD  Chart reviewed as part of pre-operative protocol coverage. Because of Alexandra Baker's past medical history and time since last visit, he/she will require a follow-up visit in order to better assess preoperative cardiovascular risk.  Pre-op covering staff: - Please schedule appointment and call patient to inform them. - Please contact requesting surgeon's office via preferred method (i.e, phone, fax) to inform them of need for appointment prior to surgery.  If applicable, this message will also be routed to pharmacy pool and/or primary cardiologist for input on holding anticoagulant/antiplatelet agent as requested below so that this information is available at time of patient's appointment.   Deberah Pelton, NP  02/02/2021, 3:17 PM

## 2021-02-02 NOTE — Telephone Encounter (Signed)
Pt has appt with Dr. Agustin Cree 04/2021. See notes from pre op provider today. Will defer clearance to Dr. Agustin Cree. Will send FYI to surgeon's office.

## 2021-02-08 NOTE — Telephone Encounter (Signed)
   Name: Alexandra Baker  DOB: 28-Sep-1950  MRN: [id number]   Primary Cardiologist: [name], MD  Chart reviewed as part of pre-operative protocol coverage. Patient was contacted 02/08/2021 in reference to pre-operative risk assessment for pending surgery as outlined below.  Alexandra Baker was last seen on 01/26/21 by well.  Since that day, Alexandra Baker has done well. She denies signs and symptoms of CHF exacerbation. She did have a mechanical fall 3 weeks ago and has a sore shoulder. I have asked her to see PCP for shoulder issues, but I do not think this needs to prolong her eye procedure.  Therefore, based on ACC/AHA guidelines, the patient would be at acceptable risk for the planned procedure without further cardiovascular testing.   The patient was advised that if she develops new symptoms prior to surgery to contact our office to arrange for a follow-up visit, and she verbalized understanding.  I will route this recommendation to the requesting party via Epic fax function and remove from pre-op pool. Please call with questions.  Tami Lin Sura [place]ul, PA 02/08/2021, 12:00 PM

## 2021-02-08 NOTE — Telephone Encounter (Signed)
I will forward this to the pre op provider for further review. Per Coletta Memos, FNP pt needs an appt due to recent hospitalization.

## 2021-03-10 DIAGNOSIS — M75121 Complete rotator cuff tear or rupture of right shoulder, not specified as traumatic: Secondary | ICD-10-CM | POA: Insufficient documentation

## 2021-03-10 DIAGNOSIS — M7541 Impingement syndrome of right shoulder: Secondary | ICD-10-CM | POA: Insufficient documentation

## 2021-03-23 ENCOUNTER — Other Ambulatory Visit: Payer: Self-pay | Admitting: Cardiology

## 2021-03-26 ENCOUNTER — Telehealth: Payer: Self-pay

## 2021-03-26 NOTE — Telephone Encounter (Signed)
Signa Kell Rn faxed a incomplete surgical clearance request. I faxed back a request for full information including the doctor performing the surgery, meds needing d/c and Anesthesia being used.

## 2021-03-29 ENCOUNTER — Telehealth: Payer: Self-pay | Admitting: Cardiology

## 2021-03-29 NOTE — Telephone Encounter (Signed)
Follow Up:     Mariann Laster from [facility] is calling, concerning this patient's clearance.

## 2021-03-29 NOTE — Telephone Encounter (Signed)
Per phone note on 03/26/2021, looks like an incomplete pre-op request form was sent to [place]. A note was sent back to requesting office asking for additional information. Can you please follow-up on this and see if you can get completed request form?  Thank you!

## 2021-03-29 NOTE — Telephone Encounter (Signed)
See note from [place] with requesting office who called back with missing information. I will forward to pre op pool  for review.

## 2021-03-29 NOTE — Telephone Encounter (Signed)
   [facility] Pre-operative Risk Assessment    Patient Name: Alexandra Baker  DOB: 10/14/1950 MRN: [id number]  HEARTCARE STAFF:  - IMPORTANT!!!!!! Under Visit Info/Reason for Call, type in Other and utilize the format Clearance MM/DD/YY or Clearance TBD. Do not use dashes or single digits. - Please review there is not already an duplicate clearance open for this procedure. - If request is for dental extraction, please clarify the # of teeth to be extracted. - If the patient is currently at the dentist's office, call Pre-Op Callback Staff (MA/nurse) to input urgent request.  - If the patient is not currently in the dentist office, please route to the Pre-Op pool.  Request for surgical clearance:  What type of surgery is being performed? Right shoulder arthroscopy, debridement, subacromial decompression, rotator cuff repair  When is this surgery scheduled? LEFT MESSAGE FOR CALL BACK  What type of clearance is required (medical clearance vs. Pharmacy clearance to hold med vs. Both)? MEDICAL  Are there any medications that need to be held prior to surgery and how long? ASA  Practice name and name of physician performing surgery? [facility]  What is the office phone number? [phone]   7.   What is the office fax number? NEED FAX #  8.   Anesthesia type (None, local, MAC, general) ? LEFT MESSAGE FOR CALL BACK   Julaine Hua 03/29/2021, 1:49 PM  _________________________________________________________________   (provider comments below)

## 2021-03-29 NOTE — Telephone Encounter (Signed)
Alexandra Baker is returning [employer]. Pt is scheduled for surgery 04/09/21, fax number is [phone], and the anesthesia type is general, with nerve block in arm.

## 2021-03-30 NOTE — Telephone Encounter (Signed)
Hi Dr. Agustin Cree,  Alexandra Baker has upcoming right shoulder surgery planned and surgeon is asking to hold Aspirin. She has a history of CAD s/p CABG in 1999. Nuclear stress test in 2018 was normal. You last saw the patient in 12/2020 at which she noted some lower extremity edema and a 3 lb weight gain but denied any shortness of breath. Only felt to be minimal volume overloaded on exam. Pro BNP was elevated in 4,942 but when our office called patient with labs results she was feeling better so no changes were made to medications. Can you please comment on how long Aspirin can be held for upcoming procedure?  Please route response back to P CV DIV PREOP.  Thank you! Rennee Coyne

## 2021-03-31 NOTE — Telephone Encounter (Signed)
Unable to leave message

## 2021-04-01 DIAGNOSIS — N179 Acute kidney failure, unspecified: Secondary | ICD-10-CM | POA: Insufficient documentation

## 2021-04-02 NOTE — Telephone Encounter (Signed)
[facility] pre surgical services on the line wanting to know if pt has been cleared.. please advise

## 2021-04-06 NOTE — Telephone Encounter (Signed)
I will send a message to Dr. Wendy Poet nurse to see where we can get pt squeezed in this week per pre op provider.

## 2021-04-06 NOTE — Telephone Encounter (Signed)
   Name: Alexandra Baker  DOB: Apr 03, 1951  MRN: [id number]  Primary Cardiologist: [name], MD  Chart reviewed as part of pre-operative protocol coverage. Because of [name]'s past medical history and time since last visit, she will require a follow-up visit in order to better assess preoperative cardiovascular risk.  Pt needs an appt with Dr. Raliegh Ip ASAP. I am postponing her shoulder surgery. She was just hospitalized in [place] and received IV ABX for cellulitis. She was also taken off of her diuretic due to AKI. She is now having breathing problems.   Please reach out to her for guidance on diuretic today and going forward. I will remove this request form the preop pool. Please address cardiology clearance at her visit, hopefully she can be seen this week.   Pre-op covering staff: - Please schedule appointment and call patient to inform them. If patient already had an upcoming appointment within acceptable timeframe, please add "pre-op clearance" to the appointment notes so provider is aware. - Please contact requesting surgeon's office via preferred method (i.e, phone, fax) to inform them of need for appointment prior to surgery.  If applicable, this message will also be routed to pharmacy pool and/or primary cardiologist for input on holding anticoagulant/antiplatelet agent as requested below so that this information is available to the clearing provider at time of patient's appointment.   [place], PA  04/06/2021, 4:01 PM

## 2021-04-07 ENCOUNTER — Telehealth: Payer: Self-pay | Admitting: Cardiology

## 2021-04-07 NOTE — Telephone Encounter (Signed)
Pt seeing Dr. Agustin Cree tomorrow 04/08/21. Will forward clearance notes to MD for appt tomorrow. Will send FYI to requesting office pt has appt 04/08/21.

## 2021-04-07 NOTE — Telephone Encounter (Signed)
FAX # [phone]

## 2021-04-07 NOTE — Telephone Encounter (Signed)
-----   Message from Gita Kudo, RN sent at 04/06/2021  4:38 PM EDT ----- Regarding: FW: PT NEEDS TO BE SEEN THIS WEEK, SEE NOTES  ----- Message ----- From: Michae Kava, CMA Sent: 04/06/2021   4:34 PM EDT To: [facility]/Hp Scheduling Subject: PT NEEDS TO BE SEEN THIS WEEK, SEE NOTES       Per pre op provider today, pt needs to be seen this week. See note from Doreene Adas, [facility] pre op provider today:   Pt needs an appt with Dr. Raliegh Ip ASAP. I am postponing her shoulder surgery. She was just hospitalized in [place] and received IV ABX for cellulitis. She was also taken off of her diuretic due to AKI. She is now having breathing problems.   Please reach out to her for guidance on diuretic today and going forward. I will remove this request form the preop pool. Please address cardiology clearance at her visit, hopefully she can be seen this week.   I someone could please help and reach out to the pt to see where she can get squeezed in. I appreciate all of your help in this matter.  Thank you  Arbie Cookey

## 2021-04-07 NOTE — Telephone Encounter (Signed)
Scheduled on 09/08 in [place] office, patient aware of time and location/kbl

## 2021-04-08 ENCOUNTER — Ambulatory Visit: Payer: Medicare Other | Admitting: Cardiology

## 2021-04-08 ENCOUNTER — Other Ambulatory Visit: Payer: Self-pay

## 2021-04-08 ENCOUNTER — Encounter: Payer: Self-pay | Admitting: Cardiology

## 2021-04-08 VITALS — BP 132/62 | HR 70 | Ht 59.0 in | Wt 176.4 lb

## 2021-04-08 DIAGNOSIS — Z951 Presence of aortocoronary bypass graft: Secondary | ICD-10-CM

## 2021-04-08 DIAGNOSIS — R0600 Dyspnea, unspecified: Secondary | ICD-10-CM

## 2021-04-08 DIAGNOSIS — I255 Ischemic cardiomyopathy: Secondary | ICD-10-CM

## 2021-04-08 DIAGNOSIS — I10 Essential (primary) hypertension: Secondary | ICD-10-CM | POA: Diagnosis not present

## 2021-04-08 DIAGNOSIS — I5042 Chronic combined systolic (congestive) and diastolic (congestive) heart failure: Secondary | ICD-10-CM | POA: Diagnosis not present

## 2021-04-08 DIAGNOSIS — R0609 Other forms of dyspnea: Secondary | ICD-10-CM

## 2021-04-08 DIAGNOSIS — S46011S Strain of muscle(s) and tendon(s) of the rotator cuff of right shoulder, sequela: Secondary | ICD-10-CM

## 2021-04-08 NOTE — Patient Instructions (Signed)
Medication Instructions:  Your physician recommends that you continue on your current medications as directed. Please refer to the Current Medication list given to you today.  *If you need a refill on your cardiac medications before your next appointment, please call your pharmacy*   Lab Work: nONE If you have labs (blood work) drawn today and your tests are completely normal, you will receive your results only by: [place] (if you have MyChart) OR A paper copy in the mail If you have any lab test that is abnormal or we need to change your treatment, we will call you to review the results.   Testing/Procedures: Your physician has requested that you have an echocardiogram. Echocardiography is a painless test that uses sound waves to create images of your heart. It provides your doctor with information about the size and shape of your heart and how well your heart's chambers and valves are working. This procedure takes approximately one hour. There are no restrictions for this procedure.    [facility] [address] [place], [place] [address] Phone:  [phone]    Please arrive 15 minutes prior to your appointment time for registration and insurance purposes.  The test will take approximately 3 to 4 hours to complete; you may bring reading material.  If someone comes with you to your appointment, they will need to remain in the main lobby due to limited space in the testing area. **If you are pregnant or breastfeeding, please notify the nuclear lab prior to your appointment**  How to prepare for your Myocardial Perfusion Test: Do not eat or drink 3 hours prior to your test, except you may have water. Do not consume products containing caffeine (regular or decaffeinated) 12 hours prior to your test. (ex: coffee, chocolate, sodas, tea). Do bring a list of your current medications with you.  If not listed below, you may take your medications as  normal. Do wear comfortable clothes (no dresses or overalls) and walking shoes, tennis shoes preferred (No heels or open toe shoes are allowed). Do NOT wear cologne, perfume, aftershave, or lotions (deodorant is allowed). If these instructions are not followed, your test will have to be rescheduled.  Please report to [address] for your test.  If you have questions or concerns about your appointment, you can call the [place] [place] [facility] at [phone].  If you cannot keep your appointment, please provide 24 hours notification to the Nuclear Lab, to avoid a possible $50 charge to your account.    Follow-Up: At [facility], you and your health needs are our priority.  As part of our continuing mission to provide you with exceptional heart care, we have created designated Provider Care Teams.  These Care Teams include your primary Cardiologist (physician) and Advanced Practice Providers (APPs -  Physician Assistants and Nurse Practitioners) who all work together to provide you with the care you need, when you need it.  We recommend signing up for the patient portal called "MyChart".  Sign up information is provided on this After Visit Summary.  MyChart is used to connect with patients for Virtual Visits (Telemedicine).  Patients are able to view lab/test results, encounter notes, upcoming appointments, etc.  Non-urgent messages can be sent to your provider as well.   To learn more about what you can do with MyChart, go to [email or web address].    Your next appointment:   6 month(s)  The format for your next appointment:  In Person  Provider:   [name], MD   Other Instructions

## 2021-04-08 NOTE — Progress Notes (Signed)
Cardiology Office Note:    Date:  04/08/2021   ID:  Jenean Lindau, DOB 1951-03-03, MRN [id number]  PCP:  Alma Friendly, MD  Cardiologist:  [name], MD    Referring MD: Alma Friendly, MD   Chief Complaint  Patient presents with   Celulitis    On ABX    History of Present Illness:    Saundra Vicini is a 70 y.o. female     with past medical history significant for coronary artery disease, status post coronary bypass grafting 1999 in [facility], cardiomyopathy with ejection fraction 40 to 45%, latest echocardiogram however showed improvement left ventricle ejection fraction to 45 to 50%, mild pulmonary hypertension with mild mitral regurgitation, moderate biatrial enlargement, essential hypertension. She was referred back to [place] because she required shoulder surgery when she would like to be optimized from cardiac standpoint to before the surgery.  Overall she complain of having shortness of breath.  Her ability to exercise is limited because of fatigue tiredness and shortness of breath.  She denies having any chest pain tightness squeezing pressure burning chest. She does have a right retinal Tear which is traumatic she fell down and sustained injury to the shoulder. Past Medical History:  Diagnosis Date   Age-related osteoporosis without current pathological fracture 06/23/2020   Formatting of this note might be different from the original. Bone density January 2021 T score -2.6 to right hip -1.1 to lumbar spine patient refusing treatment   Asthmatic bronchitis without complication [id number]   Managed by dr. Alcide Clever   Benign essential hypertension 05/11/2016   Cardiomyopathy ([place]) ejection fraction 4045% in November 2020 07/18/2019   Cellulitis of left lower extremity 05/29/2019   Chronic combined systolic and diastolic CHF (congestive heart failure) ([place]) 12/31/2019   Seeing cards in [place]   Congestive heart failure ([place]) New York Heart Association  class III 07/18/2019   Coronary disease 07/18/2019   Dyslipidemia 12/25/2019   GAD (generalized anxiety disorder) 07/18/2019   GERD (gastroesophageal reflux disease) 07/18/2019   Heme positive stool 06/20/2018   Refuses colonoscopy   Hyperlipidemia 05/11/2016   Hypertension 07/18/2019   Ischemic cardiomyopathy 12/31/2019   Medically noncompliant 09/12/2018   Memory loss 06/20/2018   5/5 mini cog refuses neuro eval Since heart surgery 11/19   Mild intermittent asthma without complication 123XX123   Managed by dr. Alcide Clever   Severe obesity (BMI 35.0-39.9) with comorbidity ([place]) 12/30/2020   Stage 2 chronic kidney disease 02/14/2017   Stage 3b chronic kidney disease ([place]) 02/14/2017   Status post coronary artery bypass graft 1999 done at [facility] 07/18/2019   Tremor 06/20/2018   Declines neuro eval   Uncontrolled type 2 diabetes mellitus with hyperglycemia ([place]) 05/11/2016   Uncontrolled type 2 diabetes mellitus with microalbuminuria, with long-term current use of insulin ([place]) 07/18/2019   Managed by dr. Tamala Julian endo exclusively  Pt noncompliant with meds and ov    Past Surgical History:  Procedure Laterality Date   CARPAL TUNNEL RELEASE     CESAREAN SECTION     CHOLECYSTECTOMY     CORONARY ARTERY BYPASS GRAFT     DILATION AND CURETTAGE OF UTERUS     WRIST FRACTURE SURGERY      Current Medications: Current Meds  Medication Sig   albuterol (VENTOLIN HFA) 108 (90 Base) MCG/ACT inhaler Inhale 1 puff into the lungs every 6 (six) hours as needed for shortness of breath or wheezing.   aspirin EC 81 MG tablet Take 81 mg  by mouth daily. Swallow whole.   B Complex-C (SUPER B COMPLEX PO) Take 1 tablet by mouth daily. Unknown strength   budesonide-formoterol (SYMBICORT) 160-4.5 MCG/ACT inhaler Inhale 1 puff into the lungs daily as needed (Shortness of breath).   Calcium-Magnesium-Vitamin D (CALCIUM MAGNESIUM PO) Take 1 tablet by mouth daily. Unknown strength   carvedilol  (COREG) 6.25 MG tablet TAKE ONE (1) TABLET BY MOUTH TWO (2) TIMES DAILY (Patient taking differently: Take 6.25 mg by mouth 2 (two) times daily with a meal.)   CEFUROXIME AXETIL PO Take 500 mg by mouth 2 (two) times daily. Start on 04/02/21 for 10 days   cholecalciferol (VITAMIN D3) 25 MCG (1000 UNIT) tablet Take 1,000 Units by mouth daily.   Coenzyme Q10 50 MG CAPS Take 50 mg by mouth daily.   gabapentin (NEURONTIN) 300 MG capsule Take 300 mg by mouth at bedtime.   hydrALAZINE (APRESOLINE) 10 MG tablet TAKE ONE (1) TABLET BY MOUTH 3 TIMES DAILY (Patient taking differently: Take 10 mg by mouth 3 (three) times daily.)   Insulin Glargine, 1 Unit Dial, (TOUJEO SOLOSTAR) 300 UNIT/ML SOPN Inject 40 Units into the skin daily.   insulin lispro (HUMALOG) 100 UNIT/ML KwikPen Inject 10 Units into the skin daily. 15 minutes before meals   loratadine (CLARITIN) 10 MG tablet Take 10 mg by mouth daily.   magnesium oxide (MAG-OX) 400 MG tablet Take 400 mg by mouth daily.   Multiple Vitamin (MULTIVITAMIN) capsule Take 1 capsule by mouth daily. Unknown strength per patient   nitroGLYCERIN (NITROLINGUAL) 0.4 MG/SPRAY spray Place 1 spray under the tongue as needed for chest pain.   Omega-3 Fatty Acids (FISH OIL) 1000 MG CAPS Take 1 capsule by mouth daily.   omeprazole (PRILOSEC) 40 MG capsule Take 1 capsule by mouth daily.   Probiotic Product (PROBIOTIC & ACIDOPHILUS EX ST PO) Take 1 capsule by mouth daily.   rosuvastatin (CRESTOR) 40 MG tablet TAKE ONE (1) TABLET BY MOUTH EVERY DAY (Patient taking differently: Take 40 mg by mouth daily.)   sacubitril-valsartan (ENTRESTO) 24-26 MG TAKE ONE (1) TABLET BY MOUTH TWO (2) TIMES DAILY (Patient taking differently: Take 1 tablet by mouth 2 (two) times daily. TAKE ONE (1) TABLET BY MOUTH TWO (2) TIMES DAILY)   Tiotropium Bromide Monohydrate (SPIRIVA RESPIMAT) 1.25 MCG/ACT AERS Inhale 2 puffs into the lungs daily.   torsemide (DEMADEX) 20 MG tablet Take 0.5 tablets (10 mg  total) by mouth 2 (two) times daily.     Allergies:   Tea, Wound dressing adhesive, Ezetimibe-simvastatin, and Pitocin [oxytocin]   Social History   Socioeconomic History   Marital status: Married    Spouse name: Not on file   Number of children: Not on file   Years of education: Not on file   Highest education level: Not on file  Occupational History   Not on file  Tobacco Use   Smoking status: Never   Smokeless tobacco: Never  Vaping Use   Vaping Use: Never used  Substance and Sexual Activity   Alcohol use: Never   Drug use: Never   Sexual activity: Not on file  Other Topics Concern   Not on file  Social History Narrative   Not on file   Social Determinants of Health   Financial Resource Strain: Not on file  Food Insecurity: Not on file  Transportation Needs: Not on file  Physical Activity: Not on file  Stress: Not on file  Social Connections: Not on file     Family  History: [name]; Aneurysm in her father; Breast cancer in her mother and sister; Diabetes in her sister, sister, and sister; Hypertension in her mother; Kidney failure in her mother; Stroke in her mother. ROS:   Please see the history of present illness.    All 14 point review of systems negative except as described per history of present illness  EKGs/Labs/Other Studies Reviewed:      Recent Labs: 12/01/2020: Magnesium 2.1 01/26/2021: BUN 25; Creatinine, Ser 1.21; NT-Pro BNP 4,942; Potassium 4.0; Sodium 140  Recent Lipid Panel No results found for: CHOL, TRIG, HDL, CHOLHDL, VLDL, LDLCALC, LDLDIRECT  Physical Exam:    VS:  BP 132/62 (BP Location: Left Arm, Patient Position: Sitting)   Pulse 70   Ht '4\' 11"'$  (1.499 m)   Wt 176 lb 6.4 oz (80 kg)   SpO2 95%   BMI 35.63 kg/m     Wt Readings from Last 3 Encounters:  04/08/21 176 lb 6.4 oz (80 kg)  01/26/21 166 lb (75.3 kg)  12/18/20 165 lb (74.8 kg)     GEN:  Well nourished,  well developed in no acute distress HEENT: Normal NECK: No JVD; No carotid bruits LYMPHATICS: No lymphadenopathy CARDIAC: RRR, no murmurs, no rubs, no gallops RESPIRATORY:  Clear to auscultation without rales, wheezing or rhonchi  ABDOMEN: Soft, non-tender, non-distended MUSCULOSKELETAL:  No edema; No deformity  SKIN: Warm and dry LOWER EXTREMITIES: no swelling NEUROLOGIC:  Alert and oriented x 3 PSYCHIATRIC:  Normal affect   ASSESSMENT:    1. Benign essential hypertension   2. Dyspnea on exertion   3. Ischemic cardiomyopathy   4. Chronic combined systolic and diastolic congestive heart failure (HCC)   5. Status post coronary artery bypass graft 1999 done at [facility]   6. Traumatic complete tear of right rotator cuff, sequela    PLAN:    In order of problems listed above:  Ischemic cardiomyopathy I will schedule her to have echocardiogram done.  Last time ejection fraction was 45%. Coronary artery disease: We will schedule her to have Lexiscan to rule out ischemia.  I do this because of her inability to exercise sufficiently to get 4 METS. Chronic systolic and diastolic congestive heart failure.  She was asked to discontinue her Lasix which she did however started swelling up and she is back on diuretic now   Medication Adjustments/Labs and Tests Ordered: Current medicines are reviewed at length with the patient today.  Concerns regarding medicines are outlined above.  Orders Placed This Encounter  Procedures   MYOCARDIAL PERFUSION IMAGING   EKG 12-Lead   ECHOCARDIOGRAM COMPLETE   Medication changes: No orders of the defined types were placed in this encounter.   Signed, Park Liter, MD, [facility] 04/08/2021 11:55 AM    [place]

## 2021-04-14 ENCOUNTER — Telehealth: Payer: Self-pay | Admitting: *Deleted

## 2021-04-14 NOTE — Telephone Encounter (Signed)
Patient given detailed instructions per Myocardial Perfusion Study Information Sheet for the test on 04/21/21 at 0800. Patient notified to arrive 15 minutes early and that it is imperative to arrive on time for appointment to keep from having the test rescheduled.  If you need to cancel or reschedule your appointment, please call the office within 24 hours of your appointment. . Patient verbalized understanding.Alexandra Baker, Ranae Palms

## 2021-04-20 ENCOUNTER — Ambulatory Visit: Payer: Medicare Other | Admitting: Cardiology

## 2021-04-20 NOTE — Addendum Note (Signed)
Addended by: [name] on: 04/20/2021 03:57 PM   Modules accepted: Orders

## 2021-04-20 NOTE — Addendum Note (Signed)
Addended by: Darrel Reach on: 04/20/2021 01:43 PM   Modules accepted: Orders

## 2021-04-21 ENCOUNTER — Ambulatory Visit (INDEPENDENT_AMBULATORY_CARE_PROVIDER_SITE_OTHER): Payer: Medicare Other

## 2021-04-21 ENCOUNTER — Other Ambulatory Visit: Payer: Self-pay

## 2021-04-21 DIAGNOSIS — R0609 Other forms of dyspnea: Secondary | ICD-10-CM

## 2021-04-21 DIAGNOSIS — R0600 Dyspnea, unspecified: Secondary | ICD-10-CM | POA: Diagnosis not present

## 2021-04-21 LAB — MYOCARDIAL PERFUSION IMAGING
LV dias vol: 100 mL (ref 46–106)
LV sys vol: 63 mL
Nuc Stress EF: 37 %
Peak HR: 67 {beats}/min
Rest HR: 65 {beats}/min
Rest Nuclear Isotope Dose: 9.9 mCi
SDS: 2
SRS: 10
SSS: 12
ST Depression (mm): 0 mm
Stress Nuclear Isotope Dose: 30.6 mCi
TID: 1.12

## 2021-04-21 LAB — ECHOCARDIOGRAM COMPLETE
Area-P 1/2: 4.04 cm2
Calc EF: 42.5 %
Height: 59 in
S' Lateral: 3.5 cm
Single Plane A2C EF: 39.8 %
Single Plane A4C EF: 42.9 %
Weight: 2816 oz

## 2021-04-21 MED ORDER — PERFLUTREN LIPID MICROSPHERE
1.0000 mL | INTRAVENOUS | Status: AC | PRN
  Administered 2021-04-21: 4 mL via INTRAVENOUS

## 2021-04-21 MED ORDER — TECHNETIUM TC 99M TETROFOSMIN IV KIT
9.9000 | PACK | Freq: Once | INTRAVENOUS | Status: AC | PRN
  Administered 2021-04-21: 9.9 via INTRAVENOUS

## 2021-04-21 MED ORDER — TECHNETIUM TC 99M TETROFOSMIN IV KIT
30.6000 | PACK | Freq: Once | INTRAVENOUS | Status: AC | PRN
  Administered 2021-04-21: 30.6 via INTRAVENOUS

## 2021-04-21 MED ORDER — REGADENOSON 0.4 MG/5ML IV SOLN
0.4000 mg | Freq: Once | INTRAVENOUS | Status: AC
  Administered 2021-04-21: 0.4 mg via INTRAVENOUS

## 2021-04-22 ENCOUNTER — Telehealth: Payer: Self-pay

## 2021-04-22 NOTE — Telephone Encounter (Signed)
Spoke with patient regarding results and recommendation.  Patient verbalizes understanding and is agreeable to plan of care. Advised patient to call back with any issues or concerns.  

## 2021-04-22 NOTE — Telephone Encounter (Signed)
-----   Message from Park Liter, MD sent at 04/22/2021  1:30 PM EDT ----- Stress test showing ischemia she is to have a follow-up

## 2021-04-23 ENCOUNTER — Telehealth: Payer: Self-pay

## 2021-04-23 NOTE — Telephone Encounter (Signed)
Spoke with patient regarding results and recommendation.  Patient verbalizes understanding and is agreeable to plan of care. Advised patient to call back with any issues or concerns.  

## 2021-04-23 NOTE — Telephone Encounter (Signed)
-----   Message from Park Liter, MD sent at 04/22/2021 10:05 PM EDT ----- Echocardiogram showed mildly reduced left ventricular ejection fraction 40 to [id number], diastolic dysfunction, mild mitral valve regurgitation.  Left atrium moderately enlarged.

## 2021-05-05 ENCOUNTER — Other Ambulatory Visit: Payer: Self-pay

## 2021-05-05 ENCOUNTER — Encounter: Payer: Self-pay | Admitting: Cardiology

## 2021-05-05 ENCOUNTER — Ambulatory Visit: Payer: Medicare Other | Admitting: Cardiology

## 2021-05-05 ENCOUNTER — Ambulatory Visit (HOSPITAL_BASED_OUTPATIENT_CLINIC_OR_DEPARTMENT_OTHER)
Admission: RE | Admit: 2021-05-05 | Discharge: 2021-05-05 | Disposition: A | Discharge status: Home / Self Care | Payer: Medicare Other | Source: Ambulatory Visit | Attending: Cardiology | Admitting: Cardiology

## 2021-05-05 VITALS — BP 134/76 | HR 67 | Ht <= 58 in | Wt 181.0 lb

## 2021-05-05 DIAGNOSIS — Z951 Presence of aortocoronary bypass graft: Secondary | ICD-10-CM

## 2021-05-05 DIAGNOSIS — I255 Ischemic cardiomyopathy: Secondary | ICD-10-CM

## 2021-05-05 DIAGNOSIS — I5042 Chronic combined systolic (congestive) and diastolic (congestive) heart failure: Secondary | ICD-10-CM

## 2021-05-05 DIAGNOSIS — I10 Essential (primary) hypertension: Secondary | ICD-10-CM | POA: Insufficient documentation

## 2021-05-05 NOTE — Progress Notes (Signed)
Cardiology Office Note:    Date:  05/05/2021   ID:  Alexandra Baker, DOB 03-06-1951, MRN [id number]  PCP:  Alma Friendly, MD  Cardiologist:  [name], MD    Referring MD: Alma Friendly, MD   Chief Complaint  Patient presents with   Results  Am here to talk about my stress test  History of Present Illness:    Alexandra Baker is a 70 y.o. female with past medical history significant for coronary artery disease, in 1999 she did have coronary artery bypass graft 2 vessels done at [facility] hospital, I do not have description of the procedure, also cardiomyopathy with ejection fraction 40 to 45%, essential hypertension, mild pulmonary hypertension, mild mitral valve regurgitation, moderate biatrial enlargement.  She was referred to [place] because of plan for shoulder surgery.  Since she could not exercise sufficiently and she could not do 4 METS I decided to do a stress test which was performed.  Stress test showed ischemia involving anterior wall she is here to talk about that.  She denies have any chest pain tightness squeezing pressure burning chest but shortness of breath is quite prominent.  Overall she also got diminished ejection fraction which likely remained stable.  He does have some minimal swelling of lower extremities at evening time.  Past Medical History:  Diagnosis Date   Age-related osteoporosis without current pathological fracture 06/23/2020   Formatting of this note might be different from the original. Bone density January 2021 T score -2.6 to right hip -1.1 to lumbar spine patient refusing treatment   Asthmatic bronchitis without complication [id number]   Managed by dr. Alcide Clever   Benign essential hypertension 05/11/2016   Cardiomyopathy ([place]) ejection fraction 4045% in November 2020 07/18/2019   Cellulitis of left lower extremity 05/29/2019   Chronic combined systolic and diastolic CHF (congestive heart failure) ([place]) 12/31/2019   Seeing cards in  [place]   Congestive heart failure ([place]) New York Heart Association class III 07/18/2019   Coronary disease 07/18/2019   Dyslipidemia 12/25/2019   GAD (generalized anxiety disorder) 07/18/2019   GERD (gastroesophageal reflux disease) 07/18/2019   Heme positive stool 06/20/2018   Refuses colonoscopy   Hyperlipidemia 05/11/2016   Hypertension 07/18/2019   Ischemic cardiomyopathy 12/31/2019   Medically noncompliant 09/12/2018   Memory loss 06/20/2018   5/5 mini cog refuses neuro eval Since heart surgery 11/19   Mild intermittent asthma without complication [id number]   Managed by dr. Alcide Clever   Severe obesity (BMI 35.0-39.9) with comorbidity ([place]) 12/30/2020   Stage 2 chronic kidney disease 02/14/2017   Stage 3b chronic kidney disease ([place]) 02/14/2017   Status post coronary artery bypass graft 1999 done at [facility] 07/18/2019   Tremor 06/20/2018   Declines neuro eval   Uncontrolled type 2 diabetes mellitus with hyperglycemia ([place]) 05/11/2016   Uncontrolled type 2 diabetes mellitus with microalbuminuria, with long-term current use of insulin 07/18/2019   Managed by dr. Tamala Julian endo exclusively  Pt noncompliant with meds and ov    Past Surgical History:  Procedure Laterality Date   CARPAL TUNNEL RELEASE     CESAREAN SECTION     CHOLECYSTECTOMY     CORONARY ARTERY BYPASS GRAFT     DILATION AND CURETTAGE OF UTERUS     WRIST FRACTURE SURGERY      Current Medications: Current Meds  Medication Sig   albuterol (VENTOLIN HFA) 108 (90 Base) MCG/ACT inhaler Inhale 1 puff into the lungs every 6 (six) hours as needed for  shortness of breath or wheezing.   aspirin EC 81 MG tablet Take 81 mg by mouth daily. Swallow whole.   B Complex-C (SUPER B COMPLEX PO) Take 1 tablet by mouth daily. Unknown strength   budesonide-formoterol (SYMBICORT) 160-4.5 MCG/ACT inhaler Inhale 1 puff into the lungs daily as needed (Shortness of breath).   Calcium-Magnesium-Vitamin D (CALCIUM MAGNESIUM PO)  Take 1 tablet by mouth daily. Unknown strength   carvedilol (COREG) 6.25 MG tablet TAKE ONE (1) TABLET BY MOUTH TWO (2) TIMES DAILY (Patient taking differently: Take 6.25 mg by mouth 2 (two) times daily with a meal.)   CEFUROXIME AXETIL PO Take 500 mg by mouth 2 (two) times daily. Start on 04/02/21 for 10 days   cholecalciferol (VITAMIN D3) 25 MCG (1000 UNIT) tablet Take 1,000 Units by mouth daily.   Coenzyme Q10 50 MG CAPS Take 50 mg by mouth daily.   gabapentin (NEURONTIN) 300 MG capsule Take 100 mg by mouth 2 (two) times daily.   hydrALAZINE (APRESOLINE) 10 MG tablet TAKE ONE (1) TABLET BY MOUTH 3 TIMES DAILY (Patient taking differently: Take 10 mg by mouth 3 (three) times daily.)   Insulin Glargine, 1 Unit Dial, (TOUJEO SOLOSTAR) 300 UNIT/ML SOPN Inject 34 Units into the skin daily.   insulin lispro (HUMALOG) 100 UNIT/ML KwikPen Inject 12 Units into the skin daily. 15 minutes before meals   loratadine (CLARITIN) 10 MG tablet Take 10 mg by mouth daily.   magnesium oxide (MAG-OX) 400 MG tablet Take 400 mg by mouth daily.   Multiple Vitamin (MULTIVITAMIN) capsule Take 1 capsule by mouth daily. Unknown strength per patient   nitroGLYCERIN (NITROLINGUAL) 0.4 MG/SPRAY spray Place 1 spray under the tongue as needed for chest pain.   Omega-3 Fatty Acids (FISH OIL) 1000 MG CAPS Take 1 capsule by mouth daily.   omeprazole (PRILOSEC) 40 MG capsule Take 1 capsule by mouth daily.   Probiotic Product (PROBIOTIC & ACIDOPHILUS EX ST PO) Take 1 capsule by mouth daily. Unknown strength   rosuvastatin (CRESTOR) 40 MG tablet TAKE ONE (1) TABLET BY MOUTH EVERY DAY (Patient taking differently: Take 40 mg by mouth daily.)   sacubitril-valsartan (ENTRESTO) 24-26 MG TAKE ONE (1) TABLET BY MOUTH TWO (2) TIMES DAILY (Patient taking differently: Take 1 tablet by mouth 2 (two) times daily. TAKE ONE (1) TABLET BY MOUTH TWO (2) TIMES DAILY)   Tiotropium Bromide Monohydrate (SPIRIVA RESPIMAT) 1.25 MCG/ACT AERS Inhale 2 puffs  into the lungs daily.   torsemide (DEMADEX) 20 MG tablet Take 0.5 tablets (10 mg total) by mouth 2 (two) times daily.     Allergies:   Tea, Wound dressing adhesive, Ezetimibe-simvastatin, and Pitocin [oxytocin]   Social History   Socioeconomic History   Marital status: Married    Spouse name: Not on file   Number of children: Not on file   Years of education: Not on file   Highest education level: Not on file  Occupational History   Not on file  Tobacco Use   Smoking status: Never   Smokeless tobacco: Never  Vaping Use   Vaping Use: Never used  Substance and Sexual Activity   Alcohol use: Never   Drug use: Never   Sexual activity: Not on file  Other Topics Concern   Not on file  Social History Narrative   Not on file   Social Determinants of Health   Financial Resource Strain: Not on file  Food Insecurity: Not on file  Transportation Needs: Not on file  Physical Activity:  Not on file  Stress: Not on file  Social Connections: Not on file     Family History: [name]; Aneurysm in her father; Breast cancer in her mother and sister; Diabetes in her sister, sister, and sister; Hypertension in her mother; Kidney failure in her mother; Stroke in her mother. ROS:   Please see the history of present illness.    All 14 point review of systems negative except as described per history of present illness  EKGs/Labs/Other Studies Reviewed:      Recent Labs: 12/01/2020: Magnesium 2.1 01/26/2021: BUN 25; Creatinine, Ser 1.21; NT-Pro BNP 4,942; Potassium 4.0; Sodium 140  Recent Lipid Panel No results found for: CHOL, TRIG, HDL, CHOLHDL, VLDL, LDLCALC, LDLDIRECT  Physical Exam:    VS:  BP 134/76 (BP Location: Right Arm, Patient Position: Sitting)   Pulse 67   Ht 4' 9.5" (1.461 m)   Wt 181 lb (82.1 kg)   SpO2 97%   BMI 38.49 kg/m     Wt Readings from Last 3 Encounters:  05/05/21 181 lb (82.1 kg)  04/21/21 176  lb (79.8 kg)  04/08/21 176 lb 6.4 oz (80 kg)     GEN:  Well nourished, well developed in no acute distress HEENT: Normal NECK: No JVD; No carotid bruits LYMPHATICS: No lymphadenopathy CARDIAC: RRR, no murmurs, no rubs, no gallops RESPIRATORY:  Clear to auscultation without rales, wheezing or rhonchi  ABDOMEN: Soft, non-tender, non-distended MUSCULOSKELETAL:  No edema; No deformity  SKIN: Warm and dry LOWER EXTREMITIES: no swelling NEUROLOGIC:  Alert and oriented x 3 PSYCHIATRIC:  Normal affect   ASSESSMENT:    1. History of coronary artery bypass graft   2. Benign essential hypertension   3. Ischemic cardiomyopathy   4. Chronic combined systolic and diastolic CHF (congestive heart failure) (HCC)    PLAN:    In order of problems listed above:  Abnormal stress test showing ischemia involving anterior wall she does have history of coronary artery bypass grafting 1999 which was 2 vessels.  Since that time no cardiac catheterization has been performed.  She does not have typical symptoms of angina however she does have some exertional shortness of breath which could be anginal equivalent.  On top of that she is scheduled to have shoulder surgery.  Surgery apparently has been postponed for few months until rehab will be completed.  We did talk in length about what to do with the situation I think the best option would be to perform cardiac catheterization try to reassess the lesions.  Procedure explained to her including to risk benefits as well as alternatives.  Her daughter was present during our conversation. Benign essential hypertension blood pressure seems to be well controlled continue present management. Dyslipidemia: She is on high intense statin form of Crestor 40 which I will continue.  I do have her fasting lipid profile from 4 months ago showing LDL of 156 and HDL 49 I doubt she has been taking her medication at that time.  We will repeat her fasting lipid profile as a part of  work-up before cardiac catheterization and if still cost was unacceptable we will consider using PCSK9 agent. Diabetes: Poorly controlled her last hemoglobin A1c was 9.49-monthago.  I did talk to her at length and explained to her how important is taking care of her diabetes that she can   Medication Adjustments/Labs and Tests Ordered: Current medicines are reviewed at length with the patient today.  Concerns regarding medicines are outlined above.  Orders Placed This Encounter  Procedures   DG Chest 2 View   Basic metabolic panel   CBC   EKG 12-Lead   Medication changes: No orders of the defined types were placed in this encounter.   Signed, Park Liter, MD, [facility] 05/05/2021 3:12 PM    [place]

## 2021-05-05 NOTE — Patient Instructions (Signed)
Medication Instructions:  Your physician recommends that you continue on your current medications as directed. Please refer to the Current Medication list given to you today.  *If you need a refill on your cardiac medications before your next appointment, please call your pharmacy*   Lab Work: Your physician recommends that you return for lab work today: bmp, cbc  If you have labs (blood work) drawn today and your tests are completely normal, you will receive your results only by: MyChart Message (if you have MyChart) OR A paper copy in the mail If you have any lab test that is abnormal or we need to change your treatment, we will call you to review the results.   Testing/Procedures: A chest x-ray takes a picture of the organs and structures inside the chest, including the heart, lungs, and blood vessels. This test can show several things, including, whether the heart is enlarges; whether fluid is building up in the lungs; and whether pacemaker / defibrillator leads are still in place.   [facility] [address] [place], [place] [address] Dept: [phone] Loc: [phone]  Jenne Haist  05/05/2021  You are scheduled for a Cardiac Catheterization on Friday, October 14 with Dr. Peter Martinique.  1. Please arrive at the [facility] (Main Entrance A) at [facility]: [address] [place], [place] [address] at 6:30 AM (This time is two hours before your procedure to ensure your preparation). Free valet parking service is available.   Special note: Every effort is made to have your procedure done on time. Please understand that emergencies sometimes delay scheduled procedures.  2. Diet: Do not eat solid foods after midnight.  The patient may have clear liquids until 5am upon the day of the procedure.  3. Labs: You will need to have blood drawn today  4. Medication instructions in  preparation for your procedure:   Contrast Allergy: No      HOLD TORSEMIDE the day of procedure    Take only 17 units of insulin the night before your procedure. Do not take any insulin on the day of the procedure.  HOLD INSULIN HUMOLOG THE MORNING OF PROCEDURE     On the morning of your procedure, take your Aspirin and any morning medicines NOT listed above.  You may use sips of water.  5. Plan for one night stay--bring personal belongings. 6. Bring a current list of your medications and current insurance cards. 7. You MUST have a responsible person to drive you home. 8. Someone MUST be with you the first 24 hours after you arrive home or your discharge will be delayed. 9. Please wear clothes that are easy to get on and off and wear slip-on shoes.  Thank you for allowing [place] to care for you!   -- [facility]    Follow-Up: At [facility], you and your health needs are our priority.  As part of our continuing mission to provide you with exceptional heart care, we have created designated Provider Care Teams.  These Care Teams include your primary Cardiologist (physician) and Advanced Practice Providers (APPs -  Physician Assistants and Nurse Practitioners) who all work together to provide you with the care you need, when you need it.  We recommend signing up for the patient portal called "MyChart".  Sign up information is provided on this After Visit Summary.  MyChart is used to connect with patients for Virtual  Visits (Telemedicine).  Patients are able to view lab/test results, encounter notes, upcoming appointments, etc.  Non-urgent messages can be sent to your provider as well.   To learn more about what you can do with MyChart, go to [email or web address].    Your next appointment:   1 month(s)  The format for your next appointment:   In Person  Provider:   [name], MD   Other Instructions  Coronary Angiogram With  Stent Coronary angiogram with stent placement is a procedure to widen or open a narrow blood vessel of the heart (coronary artery). Arteries may become blocked by cholesterol buildup (plaques) in the lining of the artery wall. When a coronary artery becomes partially blocked, blood flow to that area decreases. This may lead to chest pain or a heart attack (myocardial infarction). A stent is a small piece of metal that looks like mesh or spring. Stent placement may be done as treatment after a heart attack, or to prevent a heart attack if a blocked artery is found by a coronary angiogram. Let your health care provider know about: Any allergies you have, including allergies to medicines or contrast dye. All medicines you are taking, including vitamins, herbs, eye drops, creams, and over-the-counter medicines. Any problems you or family members have had with anesthetic medicines. Any blood disorders you have. Any surgeries you have had. Any medical conditions you have, including kidney problems or kidney failure. Whether you are pregnant or may be pregnant. Whether you are breastfeeding. What are the risks? Generally, this is a safe procedure. However, serious problems may occur, including: Damage to nearby structures or organs, such as the heart, blood vessels, or kidneys. A return of blockage. Bleeding, infection, or bruising at the insertion site. A collection of blood under the skin (hematoma) at the insertion site. A blood clot in another part of the body. Allergic reaction to medicines or dyes. Bleeding into the abdomen (retroperitoneal bleeding). Stroke (rare). Heart attack (rare). What happens before the procedure? Staying hydrated Follow instructions from your health care provider about hydration, which may include: Up to 2 hours before the procedure - you may continue to drink clear liquids, such as water, clear fruit juice, black coffee, and plain tea.  Eating and drinking  restrictions Follow instructions from your health care provider about eating and drinking, which may include: 8 hours before the procedure - stop eating heavy meals or foods, such as meat, fried foods, or fatty foods. 6 hours before the procedure - stop eating light meals or foods, such as toast or cereal. 2 hours before the procedure - stop drinking clear liquids. Medicines Ask your health care provider about: Changing or stopping your regular medicines. This is especially important if you are taking diabetes medicines or blood thinners. Taking medicines such as aspirin and ibuprofen. These medicines can thin your blood. Do not take these medicines unless your health care provider tells you to take them. Generally, aspirin is recommended before a thin tube, called a catheter, is passed through a blood vessel and inserted into the heart (cardiac catheterization). Taking over-the-counter medicines, vitamins, herbs, and supplements. General instructions Do not use any products that contain nicotine or tobacco for at least 4 weeks before the procedure. These products include cigarettes, e-cigarettes, and chewing tobacco. If you need help quitting, ask your health care provider. Plan to have someone take you home from the hospital or clinic. If you will be going home right after the procedure, plan to have someone with you  for 24 hours. You may have tests and imaging procedures. Ask your health care provider: How your insertion site will be marked. Ask which artery will be used for the procedure. What steps will be taken to help prevent infection. These may include: Removing hair at the insertion site. Washing skin with a germ-killing soap. Taking antibiotic medicine. What happens during the procedure?  An IV will be inserted into one of your veins. Electrodes may be placed on your chest to monitor your heart rate during the procedure. You will be given one or more of the following: A medicine  to help you relax (sedative). A medicine to numb the area (local anesthetic) for catheter insertion. A small incision will be made for catheter insertion. The catheter will be inserted into an artery using a guide wire. The location may be in your groin, your wrist, or the fold of your arm (near your elbow). An X-ray procedure (fluoroscopy) will be used to help guide the catheter to the opening of the heart arteries. A dye will be injected into the catheter. X-rays will be taken. The dye helps to show where any narrowing or blockages are located in the arteries. Tell your health care provider if you have chest pain or trouble breathing. A tiny wire will be guided to the blocked spot, and a balloon will be inflated to make the artery wider. The stent will be expanded to crush the plaques into the wall of the vessel. The stent will hold the area open and improve the blood flow. Most stents have a drug coating to reduce the risk of the stent narrowing over time. The artery may be made wider using a drill, laser, or other tools that remove plaques. The catheter will be removed when the blood flow improves. The stent will stay where it was placed, and the lining of the artery will grow over it. A bandage (dressing) will be placed on the insertion site. Pressure will be applied to stop bleeding. The IV will be removed. This procedure may vary among health care providers and hospitals. What happens after the procedure? Your blood pressure, heart rate, breathing rate, and blood oxygen level will be monitored until you leave the hospital or clinic. If the procedure is done through the leg, you will lie flat in bed for a few hours or for as long as told by your health care provider. You will be instructed not to bend or cross your legs. The insertion site and the pulse in your foot or wrist will be checked often. You may have more blood tests, X-rays, and a test that records the electrical activity of your  heart (electrocardiogram, or ECG). Do not drive for 24 hours if you were given a sedative during your procedure. Summary Coronary angiogram with stent placement is a procedure to widen or open a narrowed coronary artery. This is done to treat heart problems. Before the procedure, let your health care provider know about all the medical conditions and surgeries you have or have had. This is a safe procedure. However, some problems may occur, including damage to nearby structures or organs, bleeding, blood clots, or allergies. Follow your health care provider's instructions about eating, drinking, medicines, and other lifestyle changes, such as quitting tobacco use before the procedure. This information is not intended to replace advice given to you by your health care provider. Make sure you discuss any questions you have with your health care provider. Document Revised: 02/06/2019 Document Reviewed: 02/06/2019 Elsevier Patient  Education  2022 [employer].

## 2021-05-05 NOTE — H&P (View-Only) (Signed)
Cardiology Office Note:    Date:  05/05/2021   ID:  Alexandra Baker, DOB 1950/12/27, MRN [id number]  PCP:  Alma Friendly, MD  Cardiologist:  [name], MD    Referring MD: Alma Friendly, MD   Chief Complaint  Patient presents with   Results  Am here to talk about my stress test  History of Present Illness:    Alexandra Baker is a 70 y.o. female with past medical history significant for coronary artery disease, in 1999 she did have coronary artery bypass graft 2 vessels done at [facility] hospital, I do not have description of the procedure, also cardiomyopathy with ejection fraction 40 to 45%, essential hypertension, mild pulmonary hypertension, mild mitral valve regurgitation, moderate biatrial enlargement.  She was referred to [place] because of plan for shoulder surgery.  Since she could not exercise sufficiently and she could not do 4 METS I decided to do a stress test which was performed.  Stress test showed ischemia involving anterior wall she is here to talk about that.  She denies have any chest pain tightness squeezing pressure burning chest but shortness of breath is quite prominent.  Overall she also got diminished ejection fraction which likely remained stable.  He does have some minimal swelling of lower extremities at evening time.  Past Medical History:  Diagnosis Date   Age-related osteoporosis without current pathological fracture 06/23/2020   Formatting of this note might be different from the original. Bone density January 2021 T score -2.6 to right hip -1.1 to lumbar spine patient refusing treatment   Asthmatic bronchitis without complication [id number]   Managed by dr. Alcide Clever   Benign essential hypertension 05/11/2016   Cardiomyopathy ([place]) ejection fraction 4045% in November 2020 07/18/2019   Cellulitis of left lower extremity 05/29/2019   Chronic combined systolic and diastolic CHF (congestive heart failure) ([place]) 12/31/2019   Seeing cards in  [place]   Congestive heart failure ([place]) New York Heart Association class III 07/18/2019   Coronary disease 07/18/2019   Dyslipidemia 12/25/2019   GAD (generalized anxiety disorder) 07/18/2019   GERD (gastroesophageal reflux disease) 07/18/2019   Heme positive stool 06/20/2018   Refuses colonoscopy   Hyperlipidemia 05/11/2016   Hypertension 07/18/2019   Ischemic cardiomyopathy 12/31/2019   Medically noncompliant 09/12/2018   Memory loss 06/20/2018   5/5 mini cog refuses neuro eval Since heart surgery 11/19   Mild intermittent asthma without complication [id number]   Managed by dr. Alcide Clever   Severe obesity (BMI 35.0-39.9) with comorbidity ([place]) 12/30/2020   Stage 2 chronic kidney disease 02/14/2017   Stage 3b chronic kidney disease ([place]) 02/14/2017   Status post coronary artery bypass graft 1999 done at [facility] 07/18/2019   Tremor 06/20/2018   Declines neuro eval   Uncontrolled type 2 diabetes mellitus with hyperglycemia ([place]) 05/11/2016   Uncontrolled type 2 diabetes mellitus with microalbuminuria, with long-term current use of insulin 07/18/2019   Managed by dr. Tamala Julian endo exclusively  Pt noncompliant with meds and ov    Past Surgical History:  Procedure Laterality Date   CARPAL TUNNEL RELEASE     CESAREAN SECTION     CHOLECYSTECTOMY     CORONARY ARTERY BYPASS GRAFT     DILATION AND CURETTAGE OF UTERUS     WRIST FRACTURE SURGERY      Current Medications: Current Meds  Medication Sig   albuterol (VENTOLIN HFA) 108 (90 Base) MCG/ACT inhaler Inhale 1 puff into the lungs every 6 (six) hours as needed for  shortness of breath or wheezing.   aspirin EC 81 MG tablet Take 81 mg by mouth daily. Swallow whole.   B Complex-C (SUPER B COMPLEX PO) Take 1 tablet by mouth daily. Unknown strength   budesonide-formoterol (SYMBICORT) 160-4.5 MCG/ACT inhaler Inhale 1 puff into the lungs daily as needed (Shortness of breath).   Calcium-Magnesium-Vitamin D (CALCIUM MAGNESIUM PO)  Take 1 tablet by mouth daily. Unknown strength   carvedilol (COREG) 6.25 MG tablet TAKE ONE (1) TABLET BY MOUTH TWO (2) TIMES DAILY (Patient taking differently: Take 6.25 mg by mouth 2 (two) times daily with a meal.)   CEFUROXIME AXETIL PO Take 500 mg by mouth 2 (two) times daily. Start on 04/02/21 for 10 days   cholecalciferol (VITAMIN D3) 25 MCG (1000 UNIT) tablet Take 1,000 Units by mouth daily.   Coenzyme Q10 50 MG CAPS Take 50 mg by mouth daily.   gabapentin (NEURONTIN) 300 MG capsule Take 100 mg by mouth 2 (two) times daily.   hydrALAZINE (APRESOLINE) 10 MG tablet TAKE ONE (1) TABLET BY MOUTH 3 TIMES DAILY (Patient taking differently: Take 10 mg by mouth 3 (three) times daily.)   Insulin Glargine, 1 Unit Dial, (TOUJEO SOLOSTAR) 300 UNIT/ML SOPN Inject 34 Units into the skin daily.   insulin lispro (HUMALOG) 100 UNIT/ML KwikPen Inject 12 Units into the skin daily. 15 minutes before meals   loratadine (CLARITIN) 10 MG tablet Take 10 mg by mouth daily.   magnesium oxide (MAG-OX) 400 MG tablet Take 400 mg by mouth daily.   Multiple Vitamin (MULTIVITAMIN) capsule Take 1 capsule by mouth daily. Unknown strength per patient   nitroGLYCERIN (NITROLINGUAL) 0.4 MG/SPRAY spray Place 1 spray under the tongue as needed for chest pain.   Omega-3 Fatty Acids (FISH OIL) 1000 MG CAPS Take 1 capsule by mouth daily.   omeprazole (PRILOSEC) 40 MG capsule Take 1 capsule by mouth daily.   Probiotic Product (PROBIOTIC & ACIDOPHILUS EX ST PO) Take 1 capsule by mouth daily. Unknown strength   rosuvastatin (CRESTOR) 40 MG tablet TAKE ONE (1) TABLET BY MOUTH EVERY DAY (Patient taking differently: Take 40 mg by mouth daily.)   sacubitril-valsartan (ENTRESTO) 24-26 MG TAKE ONE (1) TABLET BY MOUTH TWO (2) TIMES DAILY (Patient taking differently: Take 1 tablet by mouth 2 (two) times daily. TAKE ONE (1) TABLET BY MOUTH TWO (2) TIMES DAILY)   Tiotropium Bromide Monohydrate (SPIRIVA RESPIMAT) 1.25 MCG/ACT AERS Inhale 2 puffs  into the lungs daily.   torsemide (DEMADEX) 20 MG tablet Take 0.5 tablets (10 mg total) by mouth 2 (two) times daily.     Allergies:   Tea, Wound dressing adhesive, Ezetimibe-simvastatin, and Pitocin [oxytocin]   Social History   Socioeconomic History   Marital status: Married    Spouse name: Not on file   Number of children: Not on file   Years of education: Not on file   Highest education level: Not on file  Occupational History   Not on file  Tobacco Use   Smoking status: Never   Smokeless tobacco: Never  Vaping Use   Vaping Use: Never used  Substance and Sexual Activity   Alcohol use: Never   Drug use: Never   Sexual activity: Not on file  Other Topics Concern   Not on file  Social History Narrative   Not on file   Social Determinants of Health   Financial Resource Strain: Not on file  Food Insecurity: Not on file  Transportation Needs: Not on file  Physical Activity:  Not on file  Stress: Not on file  Social Connections: Not on file     Family History: [name]; Aneurysm in her father; Breast cancer in her mother and sister; Diabetes in her sister, sister, and sister; Hypertension in her mother; Kidney failure in her mother; Stroke in her mother. ROS:   Please see the history of present illness.    All 14 point review of systems negative except as described per history of present illness  EKGs/Labs/Other Studies Reviewed:      Recent Labs: 12/01/2020: Magnesium 2.1 01/26/2021: BUN 25; Creatinine, Ser 1.21; NT-Pro BNP 4,942; Potassium 4.0; Sodium 140  Recent Lipid Panel No results found for: CHOL, TRIG, HDL, CHOLHDL, VLDL, LDLCALC, LDLDIRECT  Physical Exam:    VS:  BP 134/76 (BP Location: Right Arm, Patient Position: Sitting)   Pulse 67   Ht 4' 9.5" (1.461 m)   Wt 181 lb (82.1 kg)   SpO2 97%   BMI 38.49 kg/m     Wt Readings from Last 3 Encounters:  05/05/21 181 lb (82.1 kg)  04/21/21 176  lb (79.8 kg)  04/08/21 176 lb 6.4 oz (80 kg)     GEN:  Well nourished, well developed in no acute distress HEENT: Normal NECK: No JVD; No carotid bruits LYMPHATICS: No lymphadenopathy CARDIAC: RRR, no murmurs, no rubs, no gallops RESPIRATORY:  Clear to auscultation without rales, wheezing or rhonchi  ABDOMEN: Soft, non-tender, non-distended MUSCULOSKELETAL:  No edema; No deformity  SKIN: Warm and dry LOWER EXTREMITIES: no swelling NEUROLOGIC:  Alert and oriented x 3 PSYCHIATRIC:  Normal affect   ASSESSMENT:    1. History of coronary artery bypass graft   2. Benign essential hypertension   3. Ischemic cardiomyopathy   4. Chronic combined systolic and diastolic CHF (congestive heart failure) (HCC)    PLAN:    In order of problems listed above:  Abnormal stress test showing ischemia involving anterior wall she does have history of coronary artery bypass grafting 1999 which was 2 vessels.  Since that time no cardiac catheterization has been performed.  She does not have typical symptoms of angina however she does have some exertional shortness of breath which could be anginal equivalent.  On top of that she is scheduled to have shoulder surgery.  Surgery apparently has been postponed for few months until rehab will be completed.  We did talk in length about what to do with the situation I think the best option would be to perform cardiac catheterization try to reassess the lesions.  Procedure explained to her including to risk benefits as well as alternatives.  Her daughter was present during our conversation. Benign essential hypertension blood pressure seems to be well controlled continue present management. Dyslipidemia: She is on high intense statin form of Crestor 40 which I will continue.  I do have her fasting lipid profile from 4 months ago showing LDL of 156 and HDL 49 I doubt she has been taking her medication at that time.  We will repeat her fasting lipid profile as a part of  work-up before cardiac catheterization and if still cost was unacceptable we will consider using PCSK9 agent. Diabetes: Poorly controlled her last hemoglobin A1c was 9.47-monthago.  I did talk to her at length and explained to her how important is taking care of her diabetes that she can   Medication Adjustments/Labs and Tests Ordered: Current medicines are reviewed at length with the patient today.  Concerns regarding medicines are outlined above.  Orders Placed This Encounter  Procedures   DG Chest 2 View   Basic metabolic panel   CBC   EKG 12-Lead   Medication changes: No orders of the defined types were placed in this encounter.   Signed, Park Liter, MD, [facility] 05/05/2021 3:12 PM    Agency

## 2021-05-06 LAB — BASIC METABOLIC PANEL
BUN/Creatinine Ratio: 20 (ref 12–28)
BUN: 33 mg/dL — ABNORMAL HIGH (ref 8–27)
CO2: 19 mmol/L — ABNORMAL LOW (ref 20–29)
Calcium: 9 mg/dL (ref 8.7–10.3)
Chloride: 104 mmol/L (ref 96–106)
Creatinine, Ser: 1.63 mg/dL — ABNORMAL HIGH (ref 0.57–1.00)
Glucose: 214 mg/dL — ABNORMAL HIGH (ref 70–99)
Potassium: 4.4 mmol/L (ref 3.5–5.2)
Sodium: 141 mmol/L (ref 134–144)
eGFR: 34 mL/min/{1.73_m2} — ABNORMAL LOW (ref >59)

## 2021-05-06 LAB — CBC
Hematocrit: 34.3 % (ref 34.0–46.6)
Hemoglobin: 11 g/dL — ABNORMAL LOW (ref 11.1–15.9)
MCH: 30.5 pg (ref 26.6–33.0)
MCHC: 32.1 g/dL (ref 31.5–35.7)
MCV: 95 fL (ref 79–97)
Platelets: 162 10*3/uL (ref 150–450)
RBC: 3.61 x10E6/uL — ABNORMAL LOW (ref 3.77–5.28)
RDW: 13.1 % (ref 11.7–15.4)
WBC: 7.6 10*3/uL (ref 3.4–10.8)

## 2021-05-06 LAB — LDL CHOLESTEROL, DIRECT: LDL Direct: 56 mg/dL (ref 0–99)

## 2021-05-10 ENCOUNTER — Telehealth: Payer: Self-pay | Admitting: Emergency Medicine

## 2021-05-10 NOTE — Telephone Encounter (Addendum)
Call placed to patient to discuss reason for taking erythromycin. Patient reports she has a history of asthma, had appt with her pulmonologist 05/06/21, was told by pulmonologist she heard a few crackles/wheezing, and was prescribed erythromycin as a precaution. Patient reports slight dry cough that is not new symptom, afebrile, no new symptoms concerning for COVID-19.  Patient reports she has had COVID two times in the past, last positive in Epic June 2022.  I will forward to Dr Martinique for review.

## 2021-05-10 NOTE — Telephone Encounter (Signed)
Patient informed of results. Patient aware to stop entresto now and until procedure and procedure date. Patient aware she needs to go in early for hydration at 530 am. Cath lab already notified. During call patient reports her pulmonologist put her on erythromycin. Verified with Desiree Lucy, RM the antibiotic is okay. Also Desiree Lucy, RN confirmed the glucose reader patient has attached to them is ok as well. No further questions at this time.

## 2021-05-10 NOTE — Telephone Encounter (Signed)
Call placed to patient to discuss arranging COVID test prior to cath 05/14/21. Patient will plan to go to local [facility] today for COVID.

## 2021-05-10 NOTE — Telephone Encounter (Signed)
Patient unable to get [facility] test at [facility] or PCP, she  will go to [facility] Pre-Procedure test site ,[address] [phone], tomorrow between 8 AM-3 PM for COVID-19 test.

## 2021-05-11 ENCOUNTER — Other Ambulatory Visit: Payer: Self-pay | Admitting: Cardiology

## 2021-05-11 ENCOUNTER — Telehealth: Payer: Self-pay | Admitting: *Deleted

## 2021-05-11 LAB — SARS CORONAVIRUS 2 (TAT 6-24 HRS): SARS Coronavirus 2: NEGATIVE

## 2021-05-11 NOTE — Telephone Encounter (Addendum)
Cardiac catheterization scheduled at [facility] for: Friday May 14, 2021 10:30 AM [facility] Main Entrance [facility]) at: 5:30 AM-pre-procedure hydration   No solid food after midnight prior to cath, clear liquids until 5 AM day of procedure.  Medication instructions: Hold:  Entresto-on hold 10/10//22-see BMP results 05/05/21 Insulin-AM of procedure/1/2 dose HS prior to procedure Torsemide-day before and day of procedure-GFR 34  Except hold medications usual morning medications can be taken pre-cath with sips of water including aspirin 81 mg.    Confirmed patient has responsible adult to drive home post procedure and be with patient first 24 hours after arriving home.  [facility] does allow one visitor to accompany you and wait in the hospital waiting room while you are there for your procedure. You and your visitor will be asked to wear a mask once you enter the hospital.   Patient reports does not currently have any new symptoms concerning for COVID-19 and no household members with COVID-19 like illness, see 05/10/21 phone note.  05/11/21 COVID-19 test negative-pt notified.                   Reviewed procedure/mask/visitor instructions with patient.              Patient does use Freestyle Libre glucose monitor, knows okay to leave on, should be covered during cath.

## 2021-05-14 ENCOUNTER — Ambulatory Visit (HOSPITAL_COMMUNITY)
Admission: RE | Admit: 2021-05-14 | Discharge: 2021-05-14 | Disposition: A | Discharge status: Home / Self Care | Payer: Medicare Other | Source: Ambulatory Visit | Attending: Cardiology | Admitting: Cardiology

## 2021-05-14 ENCOUNTER — Encounter (HOSPITAL_COMMUNITY): Payer: Self-pay | Admitting: Cardiology

## 2021-05-14 ENCOUNTER — Ambulatory Visit (HOSPITAL_COMMUNITY)
Admission: RE | Disposition: A | Discharge status: Home / Self Care | Payer: Self-pay | Source: Ambulatory Visit | Attending: Cardiology

## 2021-05-14 ENCOUNTER — Other Ambulatory Visit: Payer: Self-pay

## 2021-05-14 DIAGNOSIS — E785 Hyperlipidemia, unspecified: Secondary | ICD-10-CM | POA: Diagnosis present

## 2021-05-14 DIAGNOSIS — I5042 Chronic combined systolic (congestive) and diastolic (congestive) heart failure: Secondary | ICD-10-CM | POA: Diagnosis present

## 2021-05-14 DIAGNOSIS — Z91048 Other nonmedicinal substance allergy status: Secondary | ICD-10-CM | POA: Insufficient documentation

## 2021-05-14 DIAGNOSIS — I255 Ischemic cardiomyopathy: Secondary | ICD-10-CM | POA: Insufficient documentation

## 2021-05-14 DIAGNOSIS — I2582 Chronic total occlusion of coronary artery: Secondary | ICD-10-CM | POA: Insufficient documentation

## 2021-05-14 DIAGNOSIS — Z833 Family history of diabetes mellitus: Secondary | ICD-10-CM | POA: Diagnosis not present

## 2021-05-14 DIAGNOSIS — Z7982 Long term (current) use of aspirin: Secondary | ICD-10-CM | POA: Insufficient documentation

## 2021-05-14 DIAGNOSIS — N1832 Chronic kidney disease, stage 3b: Secondary | ICD-10-CM | POA: Diagnosis present

## 2021-05-14 DIAGNOSIS — Z794 Long term (current) use of insulin: Secondary | ICD-10-CM | POA: Diagnosis not present

## 2021-05-14 DIAGNOSIS — R0609 Other forms of dyspnea: Secondary | ICD-10-CM | POA: Diagnosis present

## 2021-05-14 DIAGNOSIS — E1122 Type 2 diabetes mellitus with diabetic chronic kidney disease: Secondary | ICD-10-CM | POA: Diagnosis not present

## 2021-05-14 DIAGNOSIS — Z888 Allergy status to other drugs, medicaments and biological substances status: Secondary | ICD-10-CM | POA: Diagnosis not present

## 2021-05-14 DIAGNOSIS — Z8249 Family history of ischemic heart disease and other diseases of the circulatory system: Secondary | ICD-10-CM | POA: Diagnosis not present

## 2021-05-14 DIAGNOSIS — Z951 Presence of aortocoronary bypass graft: Secondary | ICD-10-CM | POA: Insufficient documentation

## 2021-05-14 DIAGNOSIS — I130 Hypertensive heart and chronic kidney disease with heart failure and stage 1 through stage 4 chronic kidney disease, or unspecified chronic kidney disease: Secondary | ICD-10-CM | POA: Insufficient documentation

## 2021-05-14 DIAGNOSIS — I25810 Atherosclerosis of coronary artery bypass graft(s) without angina pectoris: Secondary | ICD-10-CM | POA: Diagnosis present

## 2021-05-14 DIAGNOSIS — I10 Essential (primary) hypertension: Secondary | ICD-10-CM | POA: Diagnosis present

## 2021-05-14 DIAGNOSIS — I2510 Atherosclerotic heart disease of native coronary artery without angina pectoris: Secondary | ICD-10-CM | POA: Insufficient documentation

## 2021-05-14 DIAGNOSIS — Z79899 Other long term (current) drug therapy: Secondary | ICD-10-CM | POA: Insufficient documentation

## 2021-05-14 HISTORY — PX: LEFT HEART CATH AND CORS/GRAFTS ANGIOGRAPHY: CATH118250

## 2021-05-14 LAB — GLUCOSE, CAPILLARY
Glucose-Capillary: 245 mg/dL — ABNORMAL HIGH (ref 70–99)
Glucose-Capillary: 202 mg/dL — ABNORMAL HIGH (ref 70–99)

## 2021-05-14 SURGERY — LEFT HEART CATH AND CORS/GRAFTS ANGIOGRAPHY
Anesthesia: LOCAL

## 2021-05-14 MED ORDER — ASPIRIN 81 MG PO CHEW: 81.0000 mg | CHEWABLE_TABLET | ORAL | Status: DC

## 2021-05-14 MED ORDER — ACETAMINOPHEN 325 MG PO TABS: 650.0000 mg | ORAL_TABLET | ORAL | Status: DC | PRN

## 2021-05-14 MED ORDER — MIDAZOLAM HCL 2 MG/2ML IJ SOLN
INTRAMUSCULAR | Status: AC
  Filled 2021-05-14: qty 2

## 2021-05-14 MED ORDER — HEPARIN (PORCINE) IN NACL 1000-0.9 UT/500ML-% IV SOLN
INTRAVENOUS | Status: DC | PRN
  Administered 2021-05-14 (×2): 500 mL

## 2021-05-14 MED ORDER — MIDAZOLAM HCL 2 MG/2ML IJ SOLN
INTRAMUSCULAR | Status: DC | PRN
  Administered 2021-05-14: 2 mg via INTRAVENOUS

## 2021-05-14 MED ORDER — LIDOCAINE HCL (PF) 1 % IJ SOLN
INTRAMUSCULAR | Status: DC | PRN
  Administered 2021-05-14: 18 mL

## 2021-05-14 MED ORDER — SODIUM CHLORIDE 0.9 % IV SOLN: 250.0000 mL | INTRAVENOUS | Status: DC | PRN

## 2021-05-14 MED ORDER — SODIUM CHLORIDE 0.9% FLUSH: 3.0000 mL | INTRAVENOUS | Status: DC | PRN

## 2021-05-14 MED ORDER — SODIUM CHLORIDE 0.9% FLUSH: 3.0000 mL | Freq: Two times a day (BID) | INTRAVENOUS | Status: DC

## 2021-05-14 MED ORDER — SODIUM CHLORIDE 0.9 % WEIGHT BASED INFUSION
3.0000 mL/kg/h | INTRAVENOUS | Status: AC
  Administered 2021-05-14: 3 mL/kg/h via INTRAVENOUS

## 2021-05-14 MED ORDER — FENTANYL CITRATE (PF) 100 MCG/2ML IJ SOLN
INTRAMUSCULAR | Status: DC | PRN
  Administered 2021-05-14: 25 ug via INTRAVENOUS

## 2021-05-14 MED ORDER — HEPARIN SODIUM (PORCINE) 1000 UNIT/ML IJ SOLN
INTRAMUSCULAR | Status: AC
  Filled 2021-05-14: qty 1

## 2021-05-14 MED ORDER — FENTANYL CITRATE (PF) 100 MCG/2ML IJ SOLN
INTRAMUSCULAR | Status: AC
  Filled 2021-05-14: qty 2

## 2021-05-14 MED ORDER — VERAPAMIL HCL 2.5 MG/ML IV SOLN
INTRAVENOUS | Status: AC
  Filled 2021-05-14: qty 2

## 2021-05-14 MED ORDER — LIDOCAINE HCL (PF) 1 % IJ SOLN
INTRAMUSCULAR | Status: AC
  Filled 2021-05-14: qty 30

## 2021-05-14 MED ORDER — HEPARIN (PORCINE) IN NACL 1000-0.9 UT/500ML-% IV SOLN
INTRAVENOUS | Status: AC
  Filled 2021-05-14: qty 1000

## 2021-05-14 MED ORDER — SODIUM CHLORIDE 0.9 % WEIGHT BASED INFUSION
1.0000 mL/kg/h | INTRAVENOUS | Status: DC
  Administered 2021-05-14: 1 mL/kg/h via INTRAVENOUS

## 2021-05-14 MED ORDER — ONDANSETRON HCL 4 MG/2ML IJ SOLN: 4.0000 mg | Freq: Four times a day (QID) | INTRAMUSCULAR | Status: DC | PRN

## 2021-05-14 MED ORDER — HYDRALAZINE HCL 20 MG/ML IJ SOLN: 10.0000 mg | INTRAMUSCULAR | Status: DC | PRN

## 2021-05-14 MED ORDER — IOHEXOL 350 MG/ML SOLN
INTRAVENOUS | Status: DC | PRN
  Administered 2021-05-14: 50 mL via INTRA_ARTERIAL

## 2021-05-14 SURGICAL SUPPLY — 9 items
CATH INFINITI 5FR MULTPACK ANG (CATHETERS) ×2 IMPLANT
CLOSURE MYNX CONTROL 5F (Vascular Products) ×2 IMPLANT
GLIDESHEATH SLEND SS 6F .021 (SHEATH) ×2 IMPLANT
KIT HEART LEFT (KITS) ×2 IMPLANT
PACK CARDIAC CATHETERIZATION (CUSTOM PROCEDURE TRAY) ×2 IMPLANT
SHEATH PINNACLE 5F 10CM (SHEATH) ×2 IMPLANT
TRANSDUCER W/STOPCOCK (MISCELLANEOUS) ×2 IMPLANT
TUBING CIL FLEX 10 FLL-RA (TUBING) ×2 IMPLANT
WIRE J 3MM .035X145CM (WIRE) ×2 IMPLANT

## 2021-05-14 NOTE — Interval H&P Note (Signed)
History and Physical Interval Note:  05/14/2021 10:10 AM  Alexandra Baker  has presented today for surgery, with the diagnosis of abnormal stress test.  The various methods of treatment have been discussed with the patient and family. After consideration of risks, benefits and other options for treatment, the patient has consented to  Procedure(s): LEFT HEART CATH AND CORS/GRAFTS ANGIOGRAPHY (N/A) as a surgical intervention.  The patient's history has been reviewed, patient examined, no change in status, stable for surgery.  I have reviewed the patient's chart and labs.  Questions were answered to the patient's satisfaction.    Cath Lab Visit (complete for each Cath Lab visit)  Clinical Evaluation Leading to the Procedure:   ACS: No.  Non-ACS:    Anginal Classification: CCS II  Anti-ischemic medical therapy: Minimal Therapy (1 class of medications)  Non-Invasive Test Results: Intermediate-risk stress test findings: cardiac mortality 1-3%/year  Prior CABG: Previous CABG       [facility] 05/14/2021 10:11 AM'

## 2021-05-17 ENCOUNTER — Telehealth: Payer: Self-pay | Admitting: Cardiology

## 2021-05-17 MED FILL — Heparin Sodium (Porcine) Inj 1000 Unit/ML: INTRAMUSCULAR | Qty: 10 | Status: AC

## 2021-05-17 MED FILL — Verapamil HCl IV Soln 2.5 MG/ML: INTRAVENOUS | Qty: 2 | Status: AC

## 2021-05-17 NOTE — Telephone Encounter (Signed)
New Message:      Daughter called. She said patient had Cath on Friday and it did not turn out like they thought it would. She would like for Dr Agustin Cree to please give her a call today when he have time please.

## 2021-05-18 NOTE — Telephone Encounter (Addendum)
Spoke to patient daughter per dpr. She is concerned with cath report. Feels there is nothing left to be done for her mother. She is also concerned asking if that patient needed a sooner appointment given her report. Will check with Dr. Agustin Cree. Patient scheduled for 06/16/21.

## 2021-05-21 NOTE — Telephone Encounter (Signed)
Spoke to patient. Informed her November appointment is ok. She understood.

## 2021-06-07 ENCOUNTER — Telehealth: Payer: Self-pay | Admitting: Cardiology

## 2021-06-07 ENCOUNTER — Other Ambulatory Visit: Payer: Self-pay | Admitting: Cardiology

## 2021-06-07 NOTE — Telephone Encounter (Signed)
Please advise if this refill is appropriate for pt. Thank you.

## 2021-06-07 NOTE — Telephone Encounter (Signed)
*  STAT* If patient is at the pharmacy, call can be transferred to refill team.   1. Which medications need to be refilled? (please list name of each medication and dose if known)  furosemide (LASIX) 40 MG tablet- pt takes 80 mg total daily   2. Which pharmacy/location (including street and city if local pharmacy) is medication to be sent to? [facility], [place] - [address]  3. Do they need a 30 day or 90 day supply? 90   Pharmacy called. The patient called the pharmacy and demanded that they give her the furosemide. The pharmacy wants to know if the patient should still be taking this medication or not.  If the patient is not to be taking this medication, please reach out to patient

## 2021-06-08 DIAGNOSIS — Z9989 Dependence on other enabling machines and devices: Secondary | ICD-10-CM | POA: Insufficient documentation

## 2021-06-08 DIAGNOSIS — D649 Anemia, unspecified: Secondary | ICD-10-CM | POA: Insufficient documentation

## 2021-06-08 DIAGNOSIS — G4733 Obstructive sleep apnea (adult) (pediatric): Secondary | ICD-10-CM | POA: Insufficient documentation

## 2021-06-11 NOTE — Telephone Encounter (Signed)
Left message for patient to return call.

## 2021-06-14 NOTE — Telephone Encounter (Signed)
Spoke to patient. She said she does not take lasix. She doesn't know why they thought she said she did she told pharmacy she was not taking lasix. Regardless patient got torsemide - what she needed. No further questions.

## 2021-06-16 ENCOUNTER — Other Ambulatory Visit: Payer: Self-pay

## 2021-06-16 ENCOUNTER — Encounter: Payer: Self-pay | Admitting: Cardiology

## 2021-06-16 ENCOUNTER — Ambulatory Visit: Payer: Medicare Other | Admitting: Cardiology

## 2021-06-16 VITALS — BP 136/72 | HR 74 | Ht <= 58 in | Wt 183.4 lb

## 2021-06-16 DIAGNOSIS — I10 Essential (primary) hypertension: Secondary | ICD-10-CM | POA: Diagnosis not present

## 2021-06-16 DIAGNOSIS — I5042 Chronic combined systolic (congestive) and diastolic (congestive) heart failure: Secondary | ICD-10-CM | POA: Diagnosis not present

## 2021-06-16 DIAGNOSIS — I25810 Atherosclerosis of coronary artery bypass graft(s) without angina pectoris: Secondary | ICD-10-CM | POA: Diagnosis not present

## 2021-06-16 DIAGNOSIS — I255 Ischemic cardiomyopathy: Secondary | ICD-10-CM

## 2021-06-16 MED ORDER — ISOSORBIDE MONONITRATE ER 30 MG PO TB24: 30.0000 mg | ORAL_TABLET | Freq: Every day | ORAL | 1 refills | Status: DC

## 2021-06-16 NOTE — Patient Instructions (Signed)
Medication Instructions:  Your physician has recommended you make the following change in your medication:   START: Imdur 30 mg daily  *If you need a refill on your cardiac medications before your next appointment, please call your pharmacy*   Lab Work: Your physician recommends that you return for lab work today: bmp   If you have labs (blood work) drawn today and your tests are completely normal, you will receive your results only by: [place] (if you have [place]) OR A paper copy in the mail If you have any lab test that is abnormal or we need to change your treatment, we will call you to review the results.   Testing/Procedures: None   Follow-Up: At [facility], you and your health needs are our priority.  As part of our continuing mission to provide you with exceptional heart care, we have created designated Provider Care Teams.  These Care Teams include your primary Cardiologist (physician) and Advanced Practice Providers (APPs -  Physician Assistants and Nurse Practitioners) who all work together to provide you with the care you need, when you need it.  We recommend signing up for the patient portal called "MyChart".  Sign up information is provided on this After Visit Summary.  MyChart is used to connect with patients for Virtual Visits (Telemedicine).  Patients are able to view lab/test results, encounter notes, upcoming appointments, etc.  Non-urgent messages can be sent to your provider as well.   To learn more about what you can do with MyChart, go to [email or web address].    Your next appointment:   6 week(s)  The format for your next appointment:   In Person  Provider:   [name], MD    Other Instructions  Isosorbide Mononitrate Extended-Release Tablets What is this medication? ISOSORBIDE MONONITRATE (eye soe SOR bide mon oh NYE trate) prevents chest pain (angina). It works by relaxing blood vessels, which decreases the amount of work the  heart has to do. It belongs to a group of medications called nitrates. Do not use it to treat sudden chest pain. This medicine may be used for other purposes; ask your health care provider or pharmacist if you have questions. COMMON BRAND NAME(S): Imdur, Isotrate ER What should I tell my care team before I take this medication? They need to know if you have any of these conditions: Previous heart attack or heart failure An unusual or allergic reaction to isosorbide mononitrate, nitrates, other medications, foods, dyes, or preservatives Pregnant or trying to get pregnant Breast-feeding How should I use this medication? Take this medication by mouth with a glass of water. Follow the directions on the prescription label. Do not crush or chew. Take your medication at regular intervals. Do not take your medication more often than directed. Do not stop taking this medication except on the advice of your care team. Talk to your care team about the use of this medication in children. Special care may be needed. Overdosage: If you think you have taken too much of this medicine contact a poison control center or emergency room at once. NOTE: This medicine is only for you. Do not share this medicine with others. What if I miss a dose? If you miss a dose, take it as soon as you can. If it is almost time for your next dose, take only that dose. Do not take double or extra doses. What may interact with this medication? Do not take this medication with any of the following: Medications used to  treat erectile dysfunction (ED) like avanafil, sildenafil, tadalafil, and vardenafil [place] This medication may also interact with the following: Medications for high blood pressure Other medications for angina or heart failure This list may not describe all possible interactions. Give your health care provider a list of all the medicines, herbs, non-prescription drugs, or dietary supplements you use. Also tell them  if you smoke, drink alcohol, or use illegal drugs. Some items may interact with your medicine. What should I watch for while using this medication? Check your heart rate and blood pressure regularly while you are taking this medication. Ask your care team what your heart rate and blood pressure should be and when you should contact him or her. Tell your care team if you feel your medication is no longer working. You may get dizzy. Do not drive, use machinery, or do anything that needs mental alertness until you know how this medication affects you. To reduce the risk of dizzy or fainting spells, do not sit or stand up quickly, especially if you are an older patient. Alcohol can make you more dizzy, and increase flushing and rapid heartbeats. Avoid alcoholic drinks. Do not treat yourself for coughs, colds, or pain while you are taking this medication without asking your care team for advice. Some ingredients may increase your blood pressure. What side effects may I notice from receiving this medication? Side effects that you should report to your care team as soon as possible: Allergic reactions--skin rash, itching, hives, swelling of the face, lips, tongue, or throat Headache, unusual weakness or fatigue, shortness of breath, nausea, vomiting, rapid heartbeat, blue skin or lips, which may be signs of methemoglobinemia Increased pressure around the brain--severe headache, blurry vision, change in vision, nausea, vomiting Low blood pressure--dizziness, feeling faint or lightheaded, blurry vision Slow heartbeat--dizziness, feeling faint or lightheaded, confusion, trouble breathing, unusual weakness or fatigue Worsening chest pain (angina)--pain, pressure, or tightness in the chest, neck, back, or arms Side effects that usually do not require medical attention (report to your care team if they continue or are bothersome): Dizziness Flushing Headache This list may not describe all possible side effects.  Call your doctor for medical advice about side effects. You may report side effects to FDA at 1-800-FDA-1088. Where should I keep my medication? Keep out of the reach of children. Store between 15 and 30 degrees C (59 and 86 degrees F). Keep container tightly closed. Throw away any unused medication after the expiration date. NOTE: This sheet is a summary. It may not cover all possible information. If you have questions about this medicine, talk to your doctor, pharmacist, or health care provider.  2022 Elsevier/Gold Standard (2020-12-02 00:00:00)

## 2021-06-16 NOTE — Progress Notes (Signed)
Cardiology Office Note:    Date:  06/16/2021   ID:  Alexandra Baker, DOB 12-Feb-1951, MRN [id number]  PCP:  Alexandra Friendly, MD  Cardiologist:  Alexandra Campus, MD    Referring MD: Alexandra Friendly, MD   Chief Complaint  Patient presents with   Follow-up  I am doing fine  History of Present Illness:    Alexandra Baker is a 70 y.o. female   with past medical history significant for coronary artery disease, status post coronary bypass grafting 1999 in [facility], cardiomyopathy with ejection fraction 40 to 45%, latest echocardiogram however showed improvement left ventricle ejection fraction to 45 to 50%, mild pulmonary hypertension with mild mitral regurgitation, moderate biatrial enlargement, essential hypertension. She was referred back to [place] because she required shoulder surgery when she would like to be optimized from cardiac standpoint to before the surgery.  Overall she complain of having shortness of breath.  Her ability to exercise is limited because of fatigue tiredness and shortness of breath.  She denies having any chest pain tightness squeezing pressure burning chest. She eventually end of having stress suspicious show ischemia involving LAD territory after the cardiac catheterization has been performed and surprisingly showed completely occluded right coronary artery with SVG going to the right coronary artery with 50% stenosis in the proximal portion, her LAD had about 95% stenosis proximally however there was a LIMA attached to mid LAD with good anastomotic site.  That was the area we suspected the problem.  Circumflex artery however was not revascularized and have very tortuous course with numerous stenosis of 80 and 90%.  Because of complexity of those lesions and tortuosity of this vessel decision has been made not to pursue intervention.  She was also find to have elevated LVEDP. She comes today to my office with her daughter to talk about all problems.   Overall she is doing fine she complained of having some shortness of breath as well as what appears to be paroxysmal nocturnal dyspnea.  She said she cannot lay flat because her nose gets clogged up but I worried this is PND.  Denies have any chest pain tightness squeezing pressure burning chest.  Past Medical History:  Diagnosis Date   Age-related osteoporosis without current pathological fracture 06/23/2020   Formatting of this note might be different from the original. Bone density January 2021 T score -2.6 to right hip -1.1 to lumbar spine patient refusing treatment   Asthmatic bronchitis without complication 46/56/8127   Managed by dr. Alcide Clever   Benign essential hypertension 05/11/2016   Cardiomyopathy ([place]) ejection fraction 4045% in November 2020 07/18/2019   Cellulitis of left lower extremity 05/29/2019   Chronic combined systolic and diastolic CHF (congestive heart failure) ([place]) 12/31/2019   Seeing cards in [place]   Congestive heart failure ([place]) New York Heart Association class III 07/18/2019   Coronary disease 07/18/2019   Dyslipidemia 12/25/2019   GAD (generalized anxiety disorder) 07/18/2019   GERD (gastroesophageal reflux disease) 07/18/2019   Heme positive stool 06/20/2018   Refuses colonoscopy   Hyperlipidemia 05/11/2016   Hypertension 07/18/2019   Ischemic cardiomyopathy 12/31/2019   Medically noncompliant 09/12/2018   Memory loss 06/20/2018   5/5 mini cog refuses neuro eval Since heart surgery 11/19   Mild intermittent asthma without complication 51/70/0174   Managed by dr. Alcide Clever   Severe obesity (BMI 35.0-39.9) with comorbidity ([place]) 12/30/2020   Stage 2 chronic kidney disease 02/14/2017   Stage 3b chronic kidney disease ([place]) 02/14/2017  Status post coronary artery bypass graft 1999 done at [facility] 07/18/2019   Tremor 06/20/2018   Declines neuro eval   Uncontrolled type 2 diabetes mellitus with hyperglycemia ([place]) 05/11/2016   Uncontrolled type 2  diabetes mellitus with microalbuminuria, with long-term current use of insulin 07/18/2019   Managed by dr. Tamala Julian endo exclusively  Pt noncompliant with meds and ov    Past Surgical History:  Procedure Laterality Date   CARPAL TUNNEL RELEASE     CESAREAN SECTION     CHOLECYSTECTOMY     CORONARY ARTERY BYPASS GRAFT     DILATION AND CURETTAGE OF UTERUS     LEFT HEART CATH AND CORS/GRAFTS ANGIOGRAPHY N/A 05/14/2021   Procedure: LEFT HEART CATH AND CORS/GRAFTS ANGIOGRAPHY;  Surgeon: Martinique, Peter M, MD;  Location: [facility];  Service: Cardiovascular;  Laterality: N/A;   WRIST FRACTURE SURGERY      Current Medications: Current Meds  Medication Sig   acetaminophen (TYLENOL) 500 MG tablet Take 500 mg by mouth every 6 (six) hours as needed for moderate pain or headache.   albuterol (VENTOLIN HFA) 108 (90 Base) MCG/ACT inhaler Inhale 2 puffs into the lungs 2 (two) times daily.   aspirin EC 81 MG tablet Take 81 mg by mouth daily. Swallow whole.   budesonide-formoterol (SYMBICORT) 160-4.5 MCG/ACT inhaler Inhale 2 puffs into the lungs 2 (two) times daily.   carvedilol (COREG) 6.25 MG tablet TAKE ONE (1) TABLET BY MOUTH TWO (2) TIMES DAILY (Patient taking differently: Take 6.25 mg by mouth 2 (two) times daily with a meal.)   gabapentin (NEURONTIN) 100 MG capsule Take 100 mg by mouth See admin instructions. Take 100 mg at night, may take a second 100 mg dose during the day as needed for pain   hydrALAZINE (APRESOLINE) 10 MG tablet TAKE ONE (1) TABLET BY MOUTH 3 TIMES DAILY (Patient taking differently: Take 10 mg by mouth 3 (three) times daily.)   Insulin Glargine, 1 Unit Dial, (TOUJEO SOLOSTAR) 300 UNIT/ML SOPN Inject 34 Units into the skin at bedtime.   insulin lispro (HUMALOG) 100 UNIT/ML KwikPen Inject 5-12 Units into the skin 3 (three) times daily with meals.   loratadine (CLARITIN) 10 MG tablet Take 10 mg by mouth daily.   Miconazole Nitrate (ATHLETES FOOT EX) Apply 1 application  topically daily as needed (yeast).   nitroGLYCERIN (NITROLINGUAL) 0.4 MG/SPRAY spray Place 1 spray under the tongue as needed for chest pain.   omeprazole (PRILOSEC) 40 MG capsule Take 40 mg by mouth daily as needed (acid reflux).   Probiotic Product (PROBIOTIC & ACIDOPHILUS EX ST PO) Take 1 capsule by mouth daily.   rosuvastatin (CRESTOR) 40 MG tablet TAKE ONE (1) TABLET BY MOUTH EVERY DAY (Patient taking differently: Take 40 mg by mouth at bedtime.)   sacubitril-valsartan (ENTRESTO) 24-26 MG TAKE ONE (1) TABLET BY MOUTH TWO (2) TIMES DAILY (Patient taking differently: Take 1 tablet by mouth 2 (two) times daily. TAKE ONE (1) TABLET BY MOUTH TWO (2) TIMES DAILY)   torsemide (DEMADEX) 20 MG tablet Take 0.5 tablets (10 mg total) by mouth 2 (two) times daily.   [DISCONTINUED] Tiotropium Bromide Monohydrate (SPIRIVA RESPIMAT) 1.25 MCG/ACT AERS Inhale 2 puffs into the lungs daily.     Allergies:   Tea, Wound dressing adhesive, Ezetimibe-simvastatin, and Pitocin [oxytocin]   Social History   Socioeconomic History   Marital status: Widowed    Spouse name: Not on file   Number of children: Not on file  Years of education: Not on file   Highest education level: Not on file  Occupational History   Not on file  Tobacco Use   Smoking status: Never   Smokeless tobacco: Never  Vaping Use   Vaping Use: Never used  Substance and Sexual Activity   Alcohol use: Never   Drug use: Never   Sexual activity: Not on file  Other Topics Concern   Not on file  Social History Narrative   Not on file   Social Determinants of Health   Financial Resource Strain: Not on file  Food Insecurity: Not on file  Transportation Needs: Not on file  Physical Activity: Not on file  Stress: Not on file  Social Connections: Not on file     Family History: [name]; Aneurysm in her father; Breast cancer in her mother and sister; Diabetes in her sister,  sister, and sister; Hypertension in her mother; Kidney failure in her mother; Stroke in her mother. ROS:   Please see the history of present illness.    All 14 point review of systems negative except as described per history of present illness  EKGs/Labs/Other Studies Reviewed:      Recent Labs: 12/01/2020: Magnesium 2.1 01/26/2021: NT-Pro BNP 4,942 05/05/2021: BUN 33; Creatinine, Ser 1.63; Hemoglobin 11.0; Platelets 162; Potassium 4.4; Sodium 141  Recent Lipid Panel    Component Value Date/Time   LDLDIRECT 56 05/05/2021 1538    Physical Exam:    VS:  BP 136/72 (BP Location: Left Arm, Patient Position: Sitting)   Pulse 74   Ht 4' 9.5" (1.461 m)   Wt 183 lb 6.4 oz (83.2 kg)   SpO2 96%   BMI 39.00 kg/m     Wt Readings from Last 3 Encounters:  06/16/21 183 lb 6.4 oz (83.2 kg)  05/14/21 165 lb (74.8 kg)  05/05/21 181 lb (82.1 kg)     GEN:  Well nourished, well developed in no acute distress HEENT: Normal NECK: No JVD; No carotid bruits LYMPHATICS: No lymphadenopathy CARDIAC: RRR, no murmurs, no rubs, no gallops RESPIRATORY:  Clear to auscultation without rales, wheezing or rhonchi  ABDOMEN: Soft, non-tender, non-distended MUSCULOSKELETAL:  No edema; No deformity  SKIN: Warm and dry LOWER EXTREMITIES: 1+ swelling NEUROLOGIC:  Alert and oriented x 3 PSYCHIATRIC:  Normal affect   ASSESSMENT:    1. Coronary artery disease involving coronary bypass graft of native heart without angina pectoris   2. Ischemic cardiomyopathy   3. Benign essential hypertension   4. Chronic combined systolic and diastolic CHF (congestive heart failure) (HCC)    PLAN:    In order of problems listed above:  Coronary artery disease status postcardiac catheterization showing multiple narrowing and tortuous circumflex artery.  We discussed option for the situation of course option with intervention is out of the question because of complexity of the lesion and tortuosity of the vessel.  He is  medical therapy for now.  Sporadically we can consider bypass surgery of the single-vessel however since she does not have much symptoms medical therapy will be tried first.  I will add long-acting nitrates to her medical regimen.  She is already on beta-blocker which I will continue. Cardiomyopathy with moderately diminished left ventricle ejection fraction.  She is already on Entresto, only very small dose 2426 because of kidney dysfunction.  She is already on Coreg which I will continue.  She also take torsemide 20 mg in the morning and 10 mg  at evening time.  I will ask her to have her Chem-7 checked today.  If Chem-7 is reasonable we will increase dose of diuretic she still gets some swelling of lower extremities on top of that she does complain of having what appears to be proximal nocturnal dyspnea.  She is already on hydralazine 10 mg 3 times daily I will add long-acting nitrates which should also improve left ventricle ejection fraction. Benign essential hypertension, blood pressure seems to well controlled continue present management. Congestive heart failure New York Heart Association class II/III plan as outlined above.   Medication Adjustments/Labs and Tests Ordered: Current medicines are reviewed at length with the patient today.  Concerns regarding medicines are outlined above.  No orders of the defined types were placed in this encounter.  Medication changes: No orders of the defined types were placed in this encounter.   Signed, Park Liter, MD, [facility] 06/16/2021 4:04 PM    [facility]

## 2021-06-16 NOTE — Addendum Note (Signed)
Addended by: Senaida Ores on: 06/16/2021 04:18 PM   Modules accepted: Orders

## 2021-06-17 ENCOUNTER — Telehealth: Payer: Self-pay

## 2021-06-17 DIAGNOSIS — I10 Essential (primary) hypertension: Secondary | ICD-10-CM

## 2021-06-17 DIAGNOSIS — Z79899 Other long term (current) drug therapy: Secondary | ICD-10-CM

## 2021-06-17 LAB — BASIC METABOLIC PANEL
BUN/Creatinine Ratio: 16 (ref 12–28)
BUN: 27 mg/dL (ref 8–27)
CO2: 21 mmol/L (ref 20–29)
Calcium: 8.9 mg/dL (ref 8.7–10.3)
Chloride: 104 mmol/L (ref 96–106)
Creatinine, Ser: 1.65 mg/dL — ABNORMAL HIGH (ref 0.57–1.00)
Glucose: 305 mg/dL — ABNORMAL HIGH (ref 70–99)
Potassium: 3.9 mmol/L (ref 3.5–5.2)
Sodium: 141 mmol/L (ref 134–144)
eGFR: 33 mL/min/{1.73_m2} — ABNORMAL LOW (ref >59)

## 2021-06-17 MED ORDER — TORSEMIDE 20 MG PO TABS: ORAL_TABLET | ORAL | 3 refills | Status: DC

## 2021-06-17 NOTE — Telephone Encounter (Signed)
Patient notified of results and recommendations and agreed with plan. Order completed and script sent with new instructions

## 2021-06-17 NOTE — Telephone Encounter (Signed)
-----   Message from Park Liter, MD sent at 06/17/2021 11:59 AM EST ----- Kidney dysfunction stable please increase dose of torsemide from 20 in the morning and 10 in the afternoon to 20 in the morning and 20 in the afternoon, we need to check Chem-7 next Thursday

## 2021-06-21 ENCOUNTER — Telehealth: Payer: Self-pay | Admitting: Cardiology

## 2021-06-21 NOTE — Telephone Encounter (Signed)
Spoke to the patient just now and she let me know that she has been having coughing/night sweats as well as feeling very dizzy and weak since starting her imdur. Per Dr. Bettina Gavia she was advised to stop the Imdur due to the dizziness and weakness. She was also advised to contact her PCP for the cough and night sweats as this should not be from the imdur.   I will let Dr. Agustin Cree know of these changes.

## 2021-06-21 NOTE — Telephone Encounter (Signed)
Patient states she has been having a cough and cold sweats at night. She would like to know if Dr. Bettina Gavia can prescribe something to assist with symptoms.

## 2021-08-06 ENCOUNTER — Ambulatory Visit: Payer: Medicare Other | Admitting: Cardiology

## 2021-08-06 ENCOUNTER — Encounter: Payer: Self-pay | Admitting: Cardiology

## 2021-08-06 ENCOUNTER — Other Ambulatory Visit: Payer: Self-pay

## 2021-08-06 VITALS — BP 130/78 | HR 63 | Ht <= 58 in | Wt 185.2 lb

## 2021-08-06 DIAGNOSIS — I10 Essential (primary) hypertension: Secondary | ICD-10-CM

## 2021-08-06 DIAGNOSIS — I255 Ischemic cardiomyopathy: Secondary | ICD-10-CM | POA: Diagnosis not present

## 2021-08-06 DIAGNOSIS — R0609 Other forms of dyspnea: Secondary | ICD-10-CM

## 2021-08-06 DIAGNOSIS — I25810 Atherosclerosis of coronary artery bypass graft(s) without angina pectoris: Secondary | ICD-10-CM | POA: Diagnosis not present

## 2021-08-06 DIAGNOSIS — G4733 Obstructive sleep apnea (adult) (pediatric): Secondary | ICD-10-CM

## 2021-08-06 DIAGNOSIS — I5042 Chronic combined systolic (congestive) and diastolic (congestive) heart failure: Secondary | ICD-10-CM

## 2021-08-06 DIAGNOSIS — Z9989 Dependence on other enabling machines and devices: Secondary | ICD-10-CM

## 2021-08-06 MED ORDER — ISOSORBIDE MONONITRATE ER 30 MG PO TB24: 30.0000 mg | ORAL_TABLET | Freq: Every day | ORAL | 1 refills | Status: AC

## 2021-08-06 NOTE — Patient Instructions (Signed)
Medication Instructions:  Your physician has recommended you make the following change in your medication:   Start: Imdur 30 mg daily  *If you need a refill on your cardiac medications before your next appointment, please call your pharmacy*   Lab Work: Your physician recommends that you return for lab work in: Labs today: Direct LDL, BMP If you have labs (blood work) drawn today and your tests are completely normal, you will receive your results only by: [place] (if you have MyChart) OR A paper copy in the mail If you have any lab test that is abnormal or we need to change your treatment, we will call you to review the results.   Testing/Procedures: None   Follow-Up: At [facility], you and your health needs are our priority.  As part of our continuing mission to provide you with exceptional heart care, we have created designated Provider Care Teams.  These Care Teams include your primary Cardiologist (physician) and Advanced Practice Providers (APPs -  Physician Assistants and Nurse Practitioners) who all work together to provide you with the care you need, when you need it.  We recommend signing up for the patient portal called "MyChart".  Sign up information is provided on this After Visit Summary.  MyChart is used to connect with patients for Virtual Visits (Telemedicine).  Patients are able to view lab/test results, encounter notes, upcoming appointments, etc.  Non-urgent messages can be sent to your provider as well.   To learn more about what you can do with MyChart, go to [email or web address].    Your next appointment:   3 month(s)  The format for your next appointment:   In Person  Provider:  [name], MD    Isosorbide Mononitrate Extended-Release Tablets What is this medication? ISOSORBIDE MONONITRATE (eye soe SOR bide mon oh NYE trate) prevents chest pain (angina). It works by relaxing blood vessels, which decreases the amount of work the heart  has to do. It belongs to a group of medications called nitrates. Do not use it to treat sudden chest pain. This medicine may be used for other purposes; ask your health care provider or pharmacist if you have questions. COMMON BRAND NAME(S): Imdur, Isotrate ER What should I tell my care team before I take this medication? They need to know if you have any of these conditions: Previous heart attack or heart failure An unusual or allergic reaction to isosorbide mononitrate, nitrates, other medications, foods, dyes, or preservatives Pregnant or trying to get pregnant Breast-feeding How should I use this medication? Take this medication by mouth with a glass of water. Follow the directions on the prescription label. Do not crush or chew. Take your medication at regular intervals. Do not take your medication more often than directed. Do not stop taking this medication except on the advice of your care team. Talk to your care team about the use of this medication in children. Special care may be needed. Overdosage: If you think you have taken too much of this medicine contact a poison control center or emergency room at once. NOTE: This medicine is only for you. Do not share this medicine with others. What if I miss a dose? If you miss a dose, take it as soon as you can. If it is almost time for your next dose, take only that dose. Do not take double or extra doses. What may interact with this medication? Do not take this medication with any of the following: Medications used to treat erectile  dysfunction (ED) like avanafil, sildenafil, tadalafil, and vardenafil [place] This medication may also interact with the following: Medications for high blood pressure Other medications for angina or heart failure This list may not describe all possible interactions. Give your health care provider a list of all the medicines, herbs, non-prescription drugs, or dietary supplements you use. Also tell them if  you smoke, drink alcohol, or use illegal drugs. Some items may interact with your medicine. What should I watch for while using this medication? Check your heart rate and blood pressure regularly while you are taking this medication. Ask your care team what your heart rate and blood pressure should be and when you should contact him or her. Tell your care team if you feel your medication is no longer working. You may get dizzy. Do not drive, use machinery, or do anything that needs mental alertness until you know how this medication affects you. To reduce the risk of dizzy or fainting spells, do not sit or stand up quickly, especially if you are an older patient. Alcohol can make you more dizzy, and increase flushing and rapid heartbeats. Avoid alcoholic drinks. Do not treat yourself for coughs, colds, or pain while you are taking this medication without asking your care team for advice. Some ingredients may increase your blood pressure. What side effects may I notice from receiving this medication? Side effects that you should report to your care team as soon as possible: Allergic reactions--skin rash, itching, hives, swelling of the face, lips, tongue, or throat Headache, unusual weakness or fatigue, shortness of breath, nausea, vomiting, rapid heartbeat, blue skin or lips, which may be signs of methemoglobinemia Increased pressure around the brain--severe headache, blurry vision, change in vision, nausea, vomiting Low blood pressure--dizziness, feeling faint or lightheaded, blurry vision Slow heartbeat--dizziness, feeling faint or lightheaded, confusion, trouble breathing, unusual weakness or fatigue Worsening chest pain (angina)--pain, pressure, or tightness in the chest, neck, back, or arms Side effects that usually do not require medical attention (report to your care team if they continue or are bothersome): Dizziness Flushing Headache This list may not describe all possible side effects.  Call your doctor for medical advice about side effects. You may report side effects to FDA at 1-800-FDA-1088. Where should I keep my medication? Keep out of the reach of children. Store between 15 and 30 degrees C (59 and 86 degrees F). Keep container tightly closed. Throw away any unused medication after the expiration date. NOTE: This sheet is a summary. It may not cover all possible information. If you have questions about this medicine, talk to your doctor, pharmacist, or health care provider.  2022 Elsevier/Gold Standard (2020-12-02 00:00:00)

## 2021-08-06 NOTE — Addendum Note (Signed)
Addended by: Edwyna Shell I on: 08/06/2021 01:38 PM   Modules accepted: Orders

## 2021-08-06 NOTE — Progress Notes (Signed)
Cardiology Office Note:    Date:  08/06/2021   ID:  Alexandra Baker, DOB [birth year], MRN [id number]  PCP:  Alma Friendly, MD  Cardiologist:  [name], MD    Referring MD: Alma Friendly, MD   Chief Complaint  Patient presents with   Follow-up  I have a back problem but cardiac wise I am doing fine  History of Present Illness:    Alexandra Baker is a 71 y.o. female with past medical history significant for coronary artery disease, status post coronary bypass grafting in 1999 done in [facility], cardiomyopathy with ejection fraction 45 to 50%, mild pulmonary hypertension, mild mitral valve regurgitation moderate biatrial enlargement, essential hypertension. She was referred to [place] initially for work-up before shoulder surgery.  Her symptomatology was atypical she was complaining of being short of breath as well as fatigue.  Eventually the pelvic stress test done stress test was abnormal showing ischemia in LAD territory thereafter cardiac catheterization has been performed which shows surprisingly lesion which most likely hemodynamically significant and very tortuous and calcified mid circumflex artery.  That artery got numerous 80 to 90% lesions.  That was assessed as poor target for interventions recommendation was to pursue medical therapy. She is coming today to my office for follow-up.  Overall cardiac wise doing well.  Denies have any chest pain tightness squeezing pressure burning chest.  She did have a flu in November but did quite well.  Now she is suffering from back problem apparently she fell down months ago and now she is seeing chiropractor for it with a lot of pain in her back  Past Medical History:  Diagnosis Date   Age-related osteoporosis without current pathological fracture 06/23/2020   Formatting of this note might be different from the original. Bone density January 2021 T score -2.6 to right hip -1.1 to lumbar spine patient refusing  treatment   Asthmatic bronchitis without complication 47/04/6282   Managed by dr. Alcide Clever   Benign essential hypertension 05/11/2016   Cardiomyopathy ([place]) ejection fraction 4045% in November 2020 07/18/2019   Cellulitis of left lower extremity 05/29/2019   Chronic combined systolic and diastolic CHF (congestive heart failure) ([place]) 12/31/2019   Seeing cards in [place]   Congestive heart failure ([place]) New York Heart Association class III 07/18/2019   Coronary disease 07/18/2019   Dyslipidemia 12/25/2019   GAD (generalized anxiety disorder) 07/18/2019   GERD (gastroesophageal reflux disease) 07/18/2019   Heme positive stool 06/20/2018   Refuses colonoscopy   Hyperlipidemia 05/11/2016   Hypertension 07/18/2019   Ischemic cardiomyopathy 12/31/2019   Medically noncompliant 09/12/2018   Memory loss 06/20/2018   5/5 mini cog refuses neuro eval Since heart surgery 11/19   Mild intermittent asthma without complication 66/29/4765   Managed by dr. Alcide Clever   Severe obesity (BMI 35.0-39.9) with comorbidity ([place]) 12/30/2020   Stage 2 chronic kidney disease 02/14/2017   Stage 3b chronic kidney disease ([place]) 02/14/2017   Status post coronary artery bypass graft 1999 done at [facility] 07/18/2019   Tremor 06/20/2018   Declines neuro eval   Uncontrolled type 2 diabetes mellitus with hyperglycemia ([place]) 05/11/2016   Uncontrolled type 2 diabetes mellitus with microalbuminuria, with long-term current use of insulin 07/18/2019   Managed by dr. Tamala Julian endo exclusively  Pt noncompliant with meds and ov    Past Surgical History:  Procedure Laterality Date   CARPAL TUNNEL RELEASE     [place]  CORONARY ARTERY BYPASS GRAFT     DILATION AND CURETTAGE OF UTERUS     LEFT HEART CATH AND CORS/GRAFTS ANGIOGRAPHY N/A 05/14/2021   Procedure: LEFT HEART CATH AND CORS/GRAFTS ANGIOGRAPHY;  Surgeon: Martinique, Peter M, MD;  Location: [facility];  Service: Cardiovascular;   Laterality: N/A;   WRIST FRACTURE SURGERY      Current Medications: Current Meds  Medication Sig   acetaminophen (TYLENOL) 500 MG tablet Take 500 mg by mouth every 6 (six) hours as needed for moderate pain or headache.   albuterol (VENTOLIN HFA) 108 (90 Base) MCG/ACT inhaler Inhale 2 puffs into the lungs 2 (two) times daily.   aspirin EC 81 MG tablet Take 81 mg by mouth daily. Swallow whole.   budesonide-formoterol (SYMBICORT) 160-4.5 MCG/ACT inhaler Inhale 2 puffs into the lungs 2 (two) times daily.   carvedilol (COREG) 6.25 MG tablet Take 6.25 mg by mouth 2 (two) times daily with a meal.   gabapentin (NEURONTIN) 100 MG capsule Take 100 mg by mouth See admin instructions. Take 100 mg at night, may take a second 100 mg dose during the day as needed for pain   hydrALAZINE (APRESOLINE) 10 MG tablet TAKE ONE (1) TABLET BY MOUTH 3 TIMES DAILY   Insulin Glargine, 1 Unit Dial, (TOUJEO SOLOSTAR) 300 UNIT/ML SOPN Inject 34 Units into the skin at bedtime.   insulin lispro (HUMALOG) 100 UNIT/ML KwikPen Inject 5-12 Units into the skin 3 (three) times daily with meals.   ipratropium-albuterol (DUONEB) 0.5-2.5 (3) MG/3ML SOLN Take 3 mLs by nebulization as needed for shortness of breath or wheezing.   loratadine (CLARITIN) 10 MG tablet Take 10 mg by mouth daily.   Miconazole Nitrate (ATHLETES FOOT EX) Apply 1 application topically daily as needed (yeast).   nitroGLYCERIN (NITROLINGUAL) 0.4 MG/SPRAY spray Place 1 spray under the tongue as needed for chest pain.   omeprazole (PRILOSEC) 40 MG capsule Take 40 mg by mouth daily as needed (acid reflux).   Probiotic Product (PROBIOTIC & ACIDOPHILUS EX ST PO) Take 1 capsule by mouth daily.   rosuvastatin (CRESTOR) 40 MG tablet TAKE ONE (1) TABLET BY MOUTH EVERY DAY   sacubitril-valsartan (ENTRESTO) 24-26 MG TAKE ONE (1) TABLET BY MOUTH TWO (2) TIMES DAILY   torsemide (DEMADEX) 20 MG tablet Take 20 mg in the morning and 20 mg in the afternoon daily.    [DISCONTINUED] carvedilol (COREG) 6.25 MG tablet TAKE ONE (1) TABLET BY MOUTH TWO (2) TIMES DAILY     Allergies:   Tea, Wound dressing adhesive, Ezetimibe-simvastatin, and Pitocin [oxytocin]   Social History   Socioeconomic History   Marital status: Widowed    Spouse name: Not on file   Number of children: Not on file   Years of education: Not on file   Highest education level: Not on file  Occupational History   Not on file  Tobacco Use   Smoking status: Never   Smokeless tobacco: Never  Vaping Use   Vaping Use: Never used  Substance and Sexual Activity   Alcohol use: Never   Drug use: Never   Sexual activity: Not on file  Other Topics Concern   Not on file  Social History Narrative   Not on file   Social Determinants of Health   Financial Resource Strain: Not on file  Food Insecurity: Not on file  Transportation Needs: Not on file  Physical Activity: Not on file  Stress: Not on file  Social Connections: Not on file  Family History: [name]; Aneurysm in her father; Breast cancer in her mother and sister; Diabetes in her sister, sister, and sister; Hypertension in her mother; Kidney failure in her mother; Stroke in her mother. ROS:   Please see the history of present illness.    All 14 point review of systems negative except as described per history of present illness  EKGs/Labs/Other Studies Reviewed:      Recent Labs: 12/01/2020: Magnesium 2.1 01/26/2021: NT-Pro BNP 4,942 05/05/2021: Hemoglobin 11.0; Platelets 162 06/16/2021: BUN 27; Creatinine, Ser 1.65; Potassium 3.9; Sodium 141  Recent Lipid Panel    Component Value Date/Time   LDLDIRECT 56 05/05/2021 1538    Physical Exam:    VS:  BP 130/78 (BP Location: Left Arm, Patient Position: Sitting)    Pulse 63    Ht 4' 9.5" (1.461 m)    Wt 185 lb 3.2 oz (84 kg)    SpO2 98%    BMI 39.38 kg/m     Wt Readings from Last 3 Encounters:  08/06/21  185 lb 3.2 oz (84 kg)  06/16/21 183 lb 6.4 oz (83.2 kg)  05/14/21 165 lb (74.8 kg)     GEN:  Well nourished, well developed in no acute distress HEENT: Normal NECK: No JVD; No carotid bruits LYMPHATICS: No lymphadenopathy CARDIAC: RRR, no murmurs, no rubs, no gallops RESPIRATORY:  Clear to auscultation without rales, wheezing or rhonchi  ABDOMEN: Soft, non-tender, non-distended MUSCULOSKELETAL:  No edema; No deformity  SKIN: Warm and dry LOWER EXTREMITIES: Minimal swelling NEUROLOGIC:  Alert and oriented x 3 PSYCHIATRIC:  Normal affect   ASSESSMENT:    1. Coronary artery disease involving coronary bypass graft of native heart without angina pectoris   2. Ischemic cardiomyopathy   3. Chronic combined systolic and diastolic CHF (congestive heart failure) ([place])   4. Benign essential hypertension   5. OSA on CPAP   6. Dyspnea on exertion   7. Severe obesity (BMI 35.0-39.9) with comorbidity ([place])    PLAN:    In order of problems listed above:  Coronary artery artery disease she did start Imdur but started vomiting some strange side effects sweating of her neck.  She discontinued that medication but the symptoms continue she reach conclusion that that was not related to this medication and she is getting ready to start back on Imdur. Ischemic cardiomyopathy with ejection fraction 4045% she is on Entresto which I will continue also Apresoline, Demadex as well as Coreg.  I have difficulty increasing the dosages of medication because of her kidney dysfunction.  Her kidney function will be checked today Essential hypertension blood pressure well controlled Dyspnea exertion: Doing well.  She is very sedentary to her realistically is very difficult to assess.   Medication Adjustments/Labs and Tests Ordered: Current medicines are reviewed at length with the patient today.  Concerns regarding medicines are outlined above.  No orders of the defined types were placed in this  encounter.  Medication changes: No orders of the defined types were placed in this encounter.   Signed, Park Liter, MD, [facility] 08/06/2021 1:17 PM    [place]

## 2021-08-07 LAB — BASIC METABOLIC PANEL
BUN/Creatinine Ratio: 23 (ref 12–28)
BUN: 39 mg/dL — ABNORMAL HIGH (ref 8–27)
CO2: 29 mmol/L (ref 20–29)
Calcium: 9 mg/dL (ref 8.7–10.3)
Chloride: 101 mmol/L (ref 96–106)
Creatinine, Ser: 1.71 mg/dL — ABNORMAL HIGH (ref 0.57–1.00)
Glucose: 262 mg/dL — ABNORMAL HIGH (ref 70–99)
Potassium: 4.2 mmol/L (ref 3.5–5.2)
Sodium: 141 mmol/L (ref 134–144)
eGFR: 32 mL/min/{1.73_m2} — ABNORMAL LOW (ref >59)

## 2021-08-07 LAB — LDL CHOLESTEROL, DIRECT: LDL Direct: 53 mg/dL (ref 0–99)

## 2021-08-10 ENCOUNTER — Telehealth: Payer: Self-pay

## 2021-08-10 NOTE — Telephone Encounter (Signed)
-----   Message from Park Liter, MD sent at 08/10/2021 12:56 PM EST ----- Labs are good, continue present management.

## 2021-08-10 NOTE — Telephone Encounter (Signed)
Spoke with patient regarding results and recommendation.  Patient verbalizes understanding and is agreeable to plan of care. Advised patient to call back with any issues or concerns.  

## 2021-09-23 ENCOUNTER — Other Ambulatory Visit: Payer: Self-pay | Admitting: Cardiology

## 2021-09-24 ENCOUNTER — Telehealth: Payer: Self-pay | Admitting: Cardiology

## 2021-09-24 DIAGNOSIS — R600 Localized edema: Secondary | ICD-10-CM

## 2021-09-24 DIAGNOSIS — I5042 Chronic combined systolic (congestive) and diastolic (congestive) heart failure: Secondary | ICD-10-CM

## 2021-09-24 NOTE — Telephone Encounter (Signed)
Pt c/o swelling: STAT is pt has developed SOB within 24 hours  How much weight have you gained and in what time span? Patient was prev 163 but when weigh on yesterday 189  If swelling, where is the swelling located? In her legs and feet   Are you currently taking a fluid pill? yes  Are you currently SOB? Yes a little   Do you have a log of your daily weights (if so, list)? no  Have you gained 3 pounds in a day or 5 pounds in a week?    Have you traveled recently? No  Patient states she can barely get her shoes on. She has appt on 4/18. Wants to know if she need to be seen sooner.

## 2021-09-24 NOTE — Telephone Encounter (Signed)
Recommendations given per Dr. Agustin Cree. Pt verbalized understanding and had no additional questions

## 2021-09-24 NOTE — Addendum Note (Signed)
Addended by: Truddie Hidden on: 09/24/2021 04:06 PM   Modules accepted: Orders

## 2021-09-24 NOTE — Telephone Encounter (Signed)
Pt states that her swelling has increased and she was instructed to call her cardiologist and let us know. Pt states that her swelling in her legs and feet has increased to where she has a hard time wearing shoes. Pt states that her Torsemide was increased to 20 mg am and 20 in pm but she feels that it is not working. Pt states tat the swelling has been increasing over the last 2 months. BP yesterday was 132/80 but she has no recorded heart rate. How do you advise?

## 2021-09-28 LAB — BASIC METABOLIC PANEL
BUN/Creatinine Ratio: 27 (ref 12–28)
BUN: 47 mg/dL — ABNORMAL HIGH (ref 8–27)
CO2: 26 mmol/L (ref 20–29)
Calcium: 9.3 mg/dL (ref 8.7–10.3)
Chloride: 102 mmol/L (ref 96–106)
Creatinine, Ser: 1.71 mg/dL — ABNORMAL HIGH (ref 0.57–1.00)
Glucose: 140 mg/dL — ABNORMAL HIGH (ref 70–99)
Potassium: 4.2 mmol/L (ref 3.5–5.2)
Sodium: 141 mmol/L (ref 134–144)
eGFR: 32 mL/min/{1.73_m2} — ABNORMAL LOW (ref >59)

## 2021-09-28 LAB — PRO B NATRIURETIC PEPTIDE: NT-Pro BNP: 4615 pg/mL — ABNORMAL HIGH (ref 0–301)

## 2021-10-11 ENCOUNTER — Ambulatory Visit: Payer: Medicare Other | Admitting: Cardiology

## 2021-11-10 DIAGNOSIS — Z5320 Procedure and treatment not carried out because of patient's decision for unspecified reasons: Secondary | ICD-10-CM | POA: Insufficient documentation

## 2021-11-16 ENCOUNTER — Ambulatory Visit: Payer: Medicare Other | Admitting: Cardiology

## 2021-11-16 ENCOUNTER — Encounter: Payer: Self-pay | Admitting: Cardiology

## 2021-11-16 VITALS — BP 128/74 | HR 72 | Ht <= 58 in | Wt 177.6 lb

## 2021-11-16 DIAGNOSIS — N1832 Chronic kidney disease, stage 3b: Secondary | ICD-10-CM | POA: Diagnosis not present

## 2021-11-16 DIAGNOSIS — I25810 Atherosclerosis of coronary artery bypass graft(s) without angina pectoris: Secondary | ICD-10-CM

## 2021-11-16 DIAGNOSIS — R0609 Other forms of dyspnea: Secondary | ICD-10-CM | POA: Diagnosis not present

## 2021-11-16 DIAGNOSIS — I10 Essential (primary) hypertension: Secondary | ICD-10-CM | POA: Diagnosis not present

## 2021-11-16 NOTE — Addendum Note (Signed)
Addended by: Jacobo Forest D on: 11/16/2021 12:32 PM ? ? Modules accepted: Orders ? ?

## 2021-11-16 NOTE — Patient Instructions (Signed)
Medication Instructions:  ?Your physician recommends that you continue on your current medications as directed. Please refer to the Current Medication list given to you today.  ?*If you need a refill on your cardiac medications before your next appointment, please call your pharmacy* ? ? ?Lab Work: ?BMP today ?If you have labs (blood work) drawn today and your tests are completely normal, you will receive your results only by: ?MyChart Message (if you have MyChart) OR ?A paper copy in the mail ?If you have any lab test that is abnormal or we need to change your treatment, we will call you to review the results. ? ? ?Testing/Procedures: ?None Ordered ? ? ?Follow-Up: ?At [facility], you and your health needs are our priority.  As part of our continuing mission to provide you with exceptional heart care, we have created designated Provider Care Teams.  These Care Teams include your primary Cardiologist (physician) and Advanced Practice Providers (APPs -  Physician Assistants and Nurse Practitioners) who all work together to provide you with the care you need, when you need it. ? ?We recommend signing up for the patient portal called "MyChart".  Sign up information is provided on this After Visit Summary.  MyChart is used to connect with patients for Virtual Visits (Telemedicine).  Patients are able to view lab/test results, encounter notes, upcoming appointments, etc.  Non-urgent messages can be sent to your provider as well.   ?To learn more about what you can do with MyChart, go to [email or web address].   ? ?Your next appointment:   ?3 month(s) ? ?The format for your next appointment:   ?In Person ? ?Provider:   ?[name], MD  ? ? ?Other Instructions ?NOne ? ?Important Information About Sugar ? ? ? ? ? ? ? ?

## 2021-11-16 NOTE — Progress Notes (Signed)
?Cardiology Office Note:   ? ?Date:  11/16/2021  ? ?ID:  Alexandra Baker, DOB Jun 11, 1951, MRN [id number] ? ?PCP:  Alexandra Friendly, MD  ?Cardiologist:  Alexandra Campus, MD   ? ?Referring MD: Alexandra Friendly, MD  ? ?Chief Complaint  ?Patient presents with  ? Follow-up  ? ? ?History of Present Illness:   ? ?Alexandra Baker is a 71 y.o. female  with past medical history significant for coronary artery disease, status post coronary bypass grafting in 1999 done in [facility], cardiomyopathy with ejection fraction 45 to 50%, mild pulmonary hypertension, mild mitral valve regurgitation moderate biatrial enlargement, essential hypertension. ?She was referred to [place] initially for work-up before shoulder surgery.  Her symptomatology was atypical she was complaining of being short of breath as well as fatigue.  Eventually the stress test done stress test was abnormal showing ischemia in LAD territory thereafter cardiac catheterization has been performed which shows surprisingly lesion which most likely hemodynamically significant and very tortuous and calcified mid circumflex artery.  That artery got numerous 80 to 90% lesions.  That was assessed as poor target for interventions recommendation was to pursue medical therapy. ?She is coming to [place] today for follow-up.  Overall she is doing well.  She denies having chest pain tightness squeezing pressure pain chest.  Her legs are much less swollen.  She does see a kidney specialist and he she was put on Zaroxolyn 3 times a week.  With improvement. ? ?Past Medical History:  ?Diagnosis Date  ? Age-related osteoporosis without current pathological fracture 06/23/2020  ? Formatting of this note might be different from the original. Bone density January 2021 T score -2.6 to right hip -1.1 to lumbar spine patient refusing treatment  ? Asthmatic bronchitis without complication 76/19/5093  ? Managed by dr. Alcide Clever  ? Benign essential hypertension 05/11/2016  ?  Cardiomyopathy ([place]) ejection fraction 4045% in November 2020 07/18/2019  ? Cellulitis of left lower extremity 05/29/2019  ? Chronic combined systolic and diastolic CHF (congestive heart failure) ([place]) 12/31/2019  ? Seeing cards in [place]  ? Congestive heart failure ([place]) New York Heart Association class III 07/18/2019  ? Coronary disease 07/18/2019  ? Dyslipidemia 12/25/2019  ? GAD (generalized anxiety disorder) 07/18/2019  ? GERD (gastroesophageal reflux disease) 07/18/2019  ? Heme positive stool 06/20/2018  ? Refuses colonoscopy  ? Hyperlipidemia 05/11/2016  ? Hypertension 07/18/2019  ? Ischemic cardiomyopathy 12/31/2019  ? Medically noncompliant 09/12/2018  ? Memory loss 06/20/2018  ? 5/5 mini cog refuses neuro eval Since heart surgery 11/19  ? Mild intermittent asthma without complication 26/71/2458  ? Managed by dr. Alcide Clever  ? Severe obesity (BMI 35.0-39.9) with comorbidity ([place]) 12/30/2020  ? Stage 2 chronic kidney disease 02/14/2017  ? Stage 3b chronic kidney disease ([place]) 02/14/2017  ? Status post coronary artery bypass graft 1999 done at [facility] 07/18/2019  ? Tremor 06/20/2018  ? Declines neuro eval  ? Uncontrolled type 2 diabetes mellitus with hyperglycemia ([place]) 05/11/2016  ? Uncontrolled type 2 diabetes mellitus with microalbuminuria, with long-term current use of insulin 07/18/2019  ? Managed by dr. Tamala Julian endo exclusively  Pt noncompliant with meds and ov  ? ? ?Past Surgical History:  ?Procedure Laterality Date  ? CARPAL TUNNEL RELEASE    ? CESAREAN SECTION    ? CHOLECYSTECTOMY    ? CORONARY ARTERY BYPASS GRAFT    ? DILATION AND CURETTAGE OF UTERUS    ? LEFT HEART CATH AND CORS/GRAFTS ANGIOGRAPHY N/A  05/14/2021  ? Procedure: LEFT HEART CATH AND CORS/GRAFTS ANGIOGRAPHY;  Surgeon: Martinique, Peter M, MD;  Location: [facility];  Service: Cardiovascular;  Laterality: N/A;  ? WRIST FRACTURE SURGERY    ? ? ?Current Medications: ?Current Meds  ?Medication Sig  ? acetaminophen (TYLENOL) 500 MG  tablet Take 500 mg by mouth every 6 (six) hours as needed for moderate pain or headache.  ? albuterol (VENTOLIN HFA) 108 (90 Base) MCG/ACT inhaler Inhale 2 puffs into the lungs 2 (two) times daily.  ? aspirin EC 81 MG tablet Take 81 mg by mouth daily. Swallow whole.  ? budesonide-formoterol (SYMBICORT) 160-4.5 MCG/ACT inhaler Inhale 2 puffs into the lungs 2 (two) times daily.  ? carvedilol (COREG) 6.25 MG tablet Take 1 tablet (6.25 mg total) by mouth 2 (two) times daily with a meal.  ? gabapentin (NEURONTIN) 100 MG capsule Take 100 mg by mouth See admin instructions. Take 100 mg at night, may take a second 100 mg dose during the day as needed for pain  ? hydrALAZINE (APRESOLINE) 10 MG tablet TAKE ONE (1) TABLET BY MOUTH 3 TIMES DAILY (Patient taking differently: Take 10 mg by mouth 3 (three) times daily.)  ? Insulin Glargine, 1 Unit Dial, (TOUJEO SOLOSTAR) 300 UNIT/ML SOPN Inject 34 Units into the skin at bedtime.  ? insulin lispro (HUMALOG) 100 UNIT/ML KwikPen Inject 5-12 Units into the skin 3 (three) times daily with meals.  ? ipratropium-albuterol (DUONEB) 0.5-2.5 (3) MG/3ML SOLN Take 3 mLs by nebulization as needed for shortness of breath or wheezing.  ? isosorbide mononitrate (IMDUR) 30 MG 24 hr tablet Take 1 tablet (30 mg total) by mouth daily.  ? loratadine (CLARITIN) 10 MG tablet Take 10 mg by mouth daily.  ? metolazone (ZAROXOLYN) 2.5 MG tablet Take 2.5 mg by mouth 3 (three) times a week. Monday, Wednesday and Friday  ? Miconazole Nitrate (ATHLETES FOOT EX) Apply 1 application topically daily as needed (yeast).  ? nitroGLYCERIN (NITROLINGUAL) 0.4 MG/SPRAY spray Place 1 spray under the tongue as needed for chest pain.  ? omeprazole (PRILOSEC) 40 MG capsule Take 40 mg by mouth daily as needed (acid reflux).  ? Probiotic Product (PROBIOTIC & ACIDOPHILUS EX ST PO) Take 1 capsule by mouth daily.  ? rosuvastatin (CRESTOR) 40 MG tablet TAKE ONE (1) TABLET BY MOUTH EVERY DAY (Patient taking differently: Take 40 mg  by mouth daily.)  ? sacubitril-valsartan (ENTRESTO) 24-26 MG TAKE ONE (1) TABLET BY MOUTH TWO (2) TIMES DAILY (Patient taking differently: Take 1 tablet by mouth 2 (two) times daily. TAKE ONE (1) TABLET BY MOUTH TWO (2) TIMES DAILY)  ? torsemide (DEMADEX) 20 MG tablet Take 20 mg in the morning and 20 mg in the afternoon daily. (Patient taking differently: Take 20 mg by mouth daily. Take 20 mg in the morning and 20 mg in the afternoon daily.)  ?  ? ?Allergies:   Tea, Wound dressing adhesive, Ezetimibe-simvastatin, and Pitocin [oxytocin]  ? ?Social History  ? ?Socioeconomic History  ? Marital status: Widowed  ?  Spouse name: Not on file  ? Number of children: Not on file  ? Years of education: Not on file  ? Highest education level: Not on file  ?Occupational History  ? Not on file  ?Tobacco Use  ? Smoking status: Never  ? Smokeless tobacco: Never  ?Vaping Use  ? Vaping Use: Never used  ?Substance and Sexual Activity  ? Alcohol use: Never  ? Drug use: Never  ? Sexual activity: Not  on file  ?Other Topics Concern  ? Not on file  ?Social History Narrative  ? Not on file  ? ?Social Determinants of Health  ? ?Financial Resource Strain: Not on file  ?Food Insecurity: Not on file  ?Transportation Needs: Not on file  ?Physical Activity: Not on file  ?Stress: Not on file  ?Social Connections: Not on file  ?  ? ?Family History: ?[name]; Aneurysm in her father; Breast cancer in her mother and sister; Diabetes in her sister, sister, and sister; Hypertension in her mother; Kidney failure in her mother; Stroke in her mother. ?ROS:   ?Please see the history of present illness.    ?All 14 point review of systems negative except as described per history of present illness ? ?EKGs/Labs/Other Studies Reviewed:   ? ? ? ?Recent Labs: ?12/01/2020: Magnesium 2.1 ?05/05/2021: Hemoglobin 11.0; Platelets 162 ?09/27/2021: BUN 47; Creatinine, Ser 1.71; NT-Pro BNP 4,615; Potassium 4.2;  Sodium 141  ?Recent Lipid Panel ?   ?Component Value Date/Time  ? LDLDIRECT 53 08/06/2021 1337  ? ? ?Physical Exam:   ? ?VS:  BP 128/74 (BP Location: Left Arm, Patient Position: Sitting)   Pulse 72   Ht 4'

## 2021-11-17 LAB — BASIC METABOLIC PANEL
BUN/Creatinine Ratio: 31 — ABNORMAL HIGH (ref 12–28)
BUN: 50 mg/dL — ABNORMAL HIGH (ref 8–27)
CO2: 25 mmol/L (ref 20–29)
Calcium: 9.5 mg/dL (ref 8.7–10.3)
Chloride: 96 mmol/L (ref 96–106)
Creatinine, Ser: 1.6 mg/dL — ABNORMAL HIGH (ref 0.57–1.00)
Glucose: 332 mg/dL — ABNORMAL HIGH (ref 70–99)
Potassium: 4.8 mmol/L (ref 3.5–5.2)
Sodium: 137 mmol/L (ref 134–144)
eGFR: 35 mL/min/{1.73_m2} — ABNORMAL LOW (ref >59)

## 2021-12-13 ENCOUNTER — Other Ambulatory Visit: Payer: Self-pay | Admitting: Cardiology

## 2022-01-17 ENCOUNTER — Other Ambulatory Visit: Payer: Self-pay | Admitting: Cardiology

## 2022-03-02 ENCOUNTER — Ambulatory Visit: Payer: Medicare Other | Admitting: Cardiology

## 2022-03-03 ENCOUNTER — Telehealth: Payer: Self-pay | Admitting: Cardiology

## 2022-03-03 DIAGNOSIS — E871 Hypo-osmolality and hyponatremia: Secondary | ICD-10-CM | POA: Insufficient documentation

## 2022-03-03 NOTE — Telephone Encounter (Signed)
Spoke with pt. She stated that she has been sick with vomiting and diarrhea all week. She went to her PCP yesterday. Her blood work showed her WBC to be 13.4. They are treating the symptoms at this time. She had surgery on the veins in her leg in 1999. She stated that her leg is swollen- no more that usual- but she is having pain from her ankle to her knee. No redness or warmth. She can flex her foot with no additional pain. She stated that it has hurt for a few days.

## 2022-03-03 NOTE — Telephone Encounter (Signed)
  Pt c/o swelling: STAT is pt has developed SOB within 24 hours  If swelling, where is the swelling located? Legs   How much weight have you gained and in what time span? No   Have you gained 3 pounds in a day or 5 pounds in a week? No   Do you have a log of your daily weights (if so, list)? No   Are you currently taking a fluid pill? Yes   Are you currently SOB? No   Have you traveled recently? No   Pt's daughter calling, she said, pt's legs is swelling and hurting. Pt describe the pain as short and she can feel the pain in her ankles and back of her calf.

## 2022-03-04 DIAGNOSIS — D696 Thrombocytopenia, unspecified: Secondary | ICD-10-CM | POA: Insufficient documentation

## 2022-03-04 DIAGNOSIS — Z7409 Other reduced mobility: Secondary | ICD-10-CM | POA: Insufficient documentation

## 2022-03-04 NOTE — Telephone Encounter (Signed)
Tried pt x 2. VM full. Called daughter Amber, LVM for pt to call back.

## 2022-03-07 NOTE — Telephone Encounter (Signed)
Daughter called this morning. She stated that the pt went to the [facility] in [place], [place] on Thursday Mar 03, 2022. She has cellulitis in her legs and had increased fluid around her heart per her daughter. Her Creatinine was 2.6 yesterday per daughter. She was giving an update and will call if she has any further needs or questions.

## 2022-03-10 ENCOUNTER — Telehealth: Payer: Self-pay | Admitting: Cardiology

## 2022-03-10 ENCOUNTER — Encounter (HOSPITAL_COMMUNITY): Payer: Self-pay

## 2022-03-10 ENCOUNTER — Inpatient Hospital Stay
Admission: AD | Admit: 2022-03-10 | Payer: Medicare Other | Source: Other Acute Inpatient Hospital | Admitting: Internal Medicine

## 2022-03-10 NOTE — Telephone Encounter (Signed)
Patient's daughter Alexandra Baker states the patient has been in the hospital for 1 week. She says she has cellulitis again, but she thinks the patient had a stroke. She says they put her on roxy 73ml for her pain. She says the patient had 5-6 of them in her system at 1 time and was not urinating. She says the patient was so messed up for 2 days she had no idea what was going on. She says she is now talking but her speech is off. She says the patient's legs are getting worse, but they are planning to release her today or tomorrow to a nursing facility. She wants to know if the patient can be transferred to [facility] hospital or what Dr. Agustin Cree recommends.

## 2022-03-10 NOTE — Telephone Encounter (Signed)
New Message:    Daughter called and wanted Dr Franki Cabot to know the patient did have a stroke. She is at [facility] is trying to get her transferred to [facility].Hospital.

## 2022-03-10 NOTE — Telephone Encounter (Signed)
Spoke with daughter. Advised to speak with attending physician about her concerns and her desire to transfer her mother. She agreed and verbalized understanding.

## 2022-03-23 DIAGNOSIS — Z8673 Personal history of transient ischemic attack (TIA), and cerebral infarction without residual deficits: Secondary | ICD-10-CM | POA: Insufficient documentation

## 2022-04-12 ENCOUNTER — Ambulatory Visit: Payer: Medicare Other | Attending: Physician Assistant | Admitting: Physician Assistant

## 2022-04-12 VITALS — BP 112/60 | HR 71 | Ht <= 58 in | Wt 210.0 lb

## 2022-04-12 DIAGNOSIS — N184 Chronic kidney disease, stage 4 (severe): Secondary | ICD-10-CM

## 2022-04-12 DIAGNOSIS — I10 Essential (primary) hypertension: Secondary | ICD-10-CM | POA: Diagnosis not present

## 2022-04-12 DIAGNOSIS — G4733 Obstructive sleep apnea (adult) (pediatric): Secondary | ICD-10-CM

## 2022-04-12 DIAGNOSIS — Z9989 Dependence on other enabling machines and devices: Secondary | ICD-10-CM

## 2022-04-12 DIAGNOSIS — I255 Ischemic cardiomyopathy: Secondary | ICD-10-CM | POA: Diagnosis not present

## 2022-04-12 DIAGNOSIS — I5042 Chronic combined systolic (congestive) and diastolic (congestive) heart failure: Secondary | ICD-10-CM

## 2022-04-12 MED ORDER — TORSEMIDE 40 MG PO TABS: 40.0000 mg | ORAL_TABLET | Freq: Two times a day (BID) | ORAL | 1 refills | Status: DC

## 2022-04-12 NOTE — Progress Notes (Signed)
Cardiology Office Note:    Date:  04/12/2022   ID:  TOSHIKA Baker, DOB [birth year], MRN [id number]  PCP:  Alma Friendly, MD  [facility] Cardiologist:  [name], MD  [facility] Electrophysiologist:  None   Chief Complaint: Hospital follow up   History of Present Illness:    Alexandra Baker is a 71 y.o. female with a hx of CAD s/p CABG in 1999, ischemic cardiomyopathy with mildly reduced LVEF, HTN, HLD, CKD, CVA and DM seen for hospital follow up.   Stress test 04/2021 was abnormal showing ischemia involving anterior wall.  Echo 04/2021 with LVEF of 40-45%, mild MR and grade II DD Cath 04/2021 showed severe 3 V disease. Patent LIMA to LAD and patent SVG to dRCA with 50% stenosis at origin of the SVG. Elevated LVEDP . Felt symptoms due to CHF.   Last seen by Dr. Agustin Cree 10/2021.  Admitted 8/3-8/14 for cellulitis of LLE, hyponatremia and AonCKD. Had metabolic encephalopathy. No DVT.  MRI of the brain which showed several small scattered acute/recent infarcts seen throughout the bilateral cerebral white matter which may be central embolic in etiology. Echocardiogram with bubble study was negative. Added Plavix.   Admitted 8/23-9/5 for CHF exacerbation. No DVT on doppler. MRI tib-fib left without IV contrast showed Fluid collection/abscess abutting the anterior tibia with marked adjacent subcutaneous fat edema.. Underwent IR drainage. Transfused 2 units of platelets for thrombocytopenia. Treated for bacterial R eye conjunctivitis. Discharged to SNF. Plan to continue DAPT until 9/14 given ischemic CVA then Plavix only.   Here today for follow up from SNF.  Sister is present during office visit.  She is gaining about half a pound each day.  Reports lower extremity edema, orthopnea and PND.  Cannot lay flat.  Uses low-sodium diet at facility.  No chest pain, dizziness or palpitation.  Past Medical History:  Diagnosis Date   Age-related osteoporosis without current  pathological fracture 06/23/2020   Formatting of this note might be different from the original. Bone density January 2021 T score -2.6 to right hip -1.1 to lumbar spine patient refusing treatment   Asthmatic bronchitis without complication 66/29/4765   Managed by dr. Alcide Clever   Benign essential hypertension 05/11/2016   Cardiomyopathy ([place]) ejection fraction 4045% in November 2020 07/18/2019   Cellulitis of left lower extremity 05/29/2019   Chronic combined systolic and diastolic CHF (congestive heart failure) ([place]) 12/31/2019   Seeing cards in [place]   Congestive heart failure ([place]) New York Heart Association class III 07/18/2019   Coronary disease 07/18/2019   Dyslipidemia 12/25/2019   GAD (generalized anxiety disorder) 07/18/2019   GERD (gastroesophageal reflux disease) 07/18/2019   Heme positive stool 06/20/2018   Refuses colonoscopy   Hyperlipidemia 05/11/2016   Hypertension 07/18/2019   Ischemic cardiomyopathy 12/31/2019   Medically noncompliant 09/12/2018   Memory loss 06/20/2018   5/5 mini cog refuses neuro eval Since heart surgery 11/19   Mild intermittent asthma without complication 46/50/3546   Managed by dr. Alcide Clever   Severe obesity (BMI 35.0-39.9) with comorbidity ([place]) 12/30/2020   Stage 2 chronic kidney disease 02/14/2017   Stage 3b chronic kidney disease ([place]) 02/14/2017   Status post coronary artery bypass graft 1999 done at [facility] 07/18/2019   Tremor 06/20/2018   Declines neuro eval   Uncontrolled type 2 diabetes mellitus with hyperglycemia ([place]) 05/11/2016   Uncontrolled type 2 diabetes mellitus with microalbuminuria, with long-term current use of insulin 07/18/2019   Managed by dr. Tamala Julian endo  exclusively  Pt noncompliant with meds and ov    Past Surgical History:  Procedure Laterality Date   CARPAL TUNNEL RELEASE     CESAREAN SECTION     CHOLECYSTECTOMY     CORONARY ARTERY BYPASS GRAFT     DILATION AND CURETTAGE OF UTERUS     LEFT HEART CATH  AND CORS/GRAFTS ANGIOGRAPHY N/A 05/14/2021   Procedure: LEFT HEART CATH AND CORS/GRAFTS ANGIOGRAPHY;  Surgeon: Martinique, Peter M, MD;  Location: [place] [facility];  Service: Cardiovascular;  Laterality: N/A;   WRIST FRACTURE SURGERY      Current Medications: Current Meds  Medication Sig   acetaminophen (TYLENOL) 325 MG tablet Take 650 mg by mouth every 4 (four) hours as needed for moderate pain or headache.   albuterol (VENTOLIN HFA) 108 (90 Base) MCG/ACT inhaler Inhale 2 puffs into the lungs 2 (two) times daily.   aspirin EC 81 MG tablet Take 81 mg by mouth daily. Swallow whole.   budesonide-formoterol (SYMBICORT) 160-4.5 MCG/ACT inhaler Inhale 2 puffs into the lungs 2 (two) times daily.   carvedilol (COREG) 6.25 MG tablet Take 1 tablet (6.25 mg total) by mouth 2 (two) times daily with a meal.   clopidogrel (PLAVIX) 75 MG tablet Take 1 tablet by mouth daily.   dapagliflozin propanediol (FARXIGA) 10 MG TABS tablet Take 1 tablet by mouth daily.   diphenhydrAMINE (BENADRYL) 25 mg capsule Take 1 capsule by mouth every 4 (four) hours as needed.   hydrALAZINE (APRESOLINE) 10 MG tablet TAKE ONE (1) TABLET BY MOUTH 3 TIMES DAILY   HYDROcodone-acetaminophen (NORCO/VICODIN) 5-325 MG tablet Take 1 tablet by mouth every 12 (twelve) hours as needed for moderate pain.   Insulin Glargine, 1 Unit Dial, (TOUJEO SOLOSTAR) 300 UNIT/ML SOPN Inject 12 Units into the skin at bedtime.   insulin lispro (HUMALOG) 100 UNIT/ML KwikPen Inject 5-12 Units into the skin 3 (three) times daily with meals.   ipratropium-albuterol (DUONEB) 0.5-2.5 (3) MG/3ML SOLN Take 3 mLs by nebulization as needed for shortness of breath or wheezing.   loratadine (CLARITIN) 10 MG tablet Take 10 mg by mouth daily.   melatonin 3 MG TABS tablet Take 3 mg by mouth at bedtime.   nitroGLYCERIN (NITROLINGUAL) 0.4 MG/SPRAY spray Place 1 spray under the tongue as needed for chest pain.   omeprazole (PRILOSEC) 40 MG capsule Take 40 mg by mouth daily  as needed (acid reflux).   Probiotic Product (PROBIOTIC & ACIDOPHILUS EX ST PO) Take 1 capsule by mouth daily.   promethazine (PHENERGAN) 12.5 MG tablet Take 1 tablet by mouth every 6 (six) hours as needed.   rosuvastatin (CRESTOR) 40 MG tablet TAKE ONE (1) TABLET BY MOUTH EVERY DAY (Patient taking differently: Take 40 mg by mouth daily.)   sacubitril-valsartan (ENTRESTO) 24-26 MG Take 1 tablet by mouth 2 (two) times daily.   senna-docusate (SENOKOT-S) 8.6-50 MG tablet Take 1 tablet by mouth daily.   witch hazel-glycerin (TUCKS) pad Apply 1 Application topically as needed for itching.   [DISCONTINUED] torsemide (DEMADEX) 20 MG tablet Take 20 mg in the morning and 20 mg in the afternoon daily. (Patient taking differently: Take 20 mg by mouth daily. Take 20 mg in the morning and 20 mg in the afternoon daily.)     Allergies:   Pitocin [oxytocin], Tea, Wound dressing adhesive, and Ezetimibe-simvastatin   Social History   Socioeconomic History   Marital status: Widowed    Spouse name: Not on file   Number of children: Not on file  Years of education: Not on file   Highest education level: Not on file  Occupational History   Not on file  Tobacco Use   Smoking status: Never   Smokeless tobacco: Never  Vaping Use   Vaping Use: Never used  Substance and Sexual Activity   Alcohol use: Never   Drug use: Never   Sexual activity: Not on file  Other Topics Concern   Not on file  Social History Narrative   Not on file   Social Determinants of Health   Financial Resource Strain: Not on file  Food Insecurity: Not on file  Transportation Needs: Not on file  Physical Activity: Not on file  Stress: Not on file  Social Connections: Not on file     Family History: [name]; Aneurysm in her father; Breast cancer in her mother and sister; Diabetes in her sister, sister, and sister; Hypertension in her mother; Kidney failure in her  mother; Stroke in her mother.    ROS:   Please see the history of present illness.    All other systems reviewed and are negative.   EKGs/Labs/Other Studies Reviewed:    The following studies were reviewed today:  Limited echo 03/11/2022 Left Ventricle  Left ventricle size is normal. Wall thickness is normal. Systolic function is mildly abnormal. EF: 45-50%. There is no thrombus.   Right Ventricle  Right ventricle is mildly dilated.   Left Atrium  Left atrium is mildly dilated. Injection of agitated saline documents no interatrial shunt.   Right Atrium  Right atrium is moderately dilated.   IVC/SVC  The inferior vena cava demonstrates a diameter of >2.1 cm and collapses <50%; therefore, the right atrial pressure is estimated at 15 mmHg.   Mitral Valve  The leaflets are mildly thickened.   Ascending Aorta  The aortic root is normal in size.   Pericardium  There is no pericardial effusion.   Study Details  A limited echo was performed using limited 2D and color flow Doppler. Saline (bubble) contrast was injected during the study. Patient has normal sinus rhythm. Overall the study quality was adequate. The imaging is on file and stored in a permanent location.  Echo 03/04/2022 Left Ventricle: Systolic function is mildly abnormal. EF: 45-50%.    Left Ventricle: Doppler parameters consistent with moderate diastolic  dysfunction and elevated LA pressure.    Mitral Valve: There is mild regurgitation.    Tricuspid Valve: There is moderate regurgitation.    Tricuspid Valve: The right ventricular systolic pressure is mildly  elevated (37-49 mmHg).   Moderate right-sided chamber enlargement new since last study 4/22   LEFT HEART CATH AND CORS/GRAFTS ANGIOGRAPHY 05/2021   Conclusion      Mid LM to Dist LM lesion is 70% stenosed.   Ost LAD to Prox LAD lesion is 95% stenosed.   Prox LAD lesion is 100% stenosed.   1st Mrg-1 lesion is 90% stenosed.   1st Mrg-2 lesion is 90%  stenosed.   Ost Cx to Mid Cx lesion is 80% stenosed.   Ost RCA to Dist RCA lesion is 100% stenosed.   Origin lesion is 50% stenosed.   LIMA graft was visualized by angiography and is normal in caliber.   SVG graft was visualized by angiography.   LV end diastolic pressure is severely elevated.   Severe 3 vessel occlusive CAD Patent LIMA to the LAD Patent SVG to the distal RCA. There is a 50% stenosis  at the origin of the SVG High LV filling pressures with EDP 34 mm Hg   Plan: the bypass grafts are patent supplying the LAD and RCA. The left main and LCx (essentially a single large OM) are severely and diffusely diseased. The LCx is extremely tortuous and heavily calcified. I don't this this is suitable for PCI. I would recommend medical treatment. Given her high LV filling pressures I suspect that her symptoms of dyspnea are primarily related to CHF.     Diagnostic Dominance: Right   Echo 04/2021  1. Left ventricular ejection fraction, by estimation, is 40 to 45%. The  left ventricle has mildly decreased function. The left ventricle has no  regional wall motion abnormalities. There is mild left ventricular  hypertrophy. Left ventricular diastolic  parameters are consistent with Grade II diastolic dysfunction  (pseudonormalization).   2. Right ventricular systolic function is normal. The right ventricular  size is normal. There is mildly elevated pulmonary artery systolic  pressure.   3. Left atrial size was moderately dilated.   4. The mitral valve is normal in structure. Mild mitral valve  regurgitation. No evidence of mitral stenosis.   5. Moderate tricuspid stenosis.   6. The aortic valve is normal in structure. Aortic valve regurgitation is  not visualized. No aortic stenosis is present.   7. The inferior vena cava is normal in size with greater than 50%  respiratory variability, suggesting right atrial pressure of 3 mmHg.   EKG:  EKG is  ordered today.  The ekg ordered today  demonstrates sinus rhythm with chronic ST/T wave abnormality in inferior lateral lead  Recent Labs: 05/05/2021: Hemoglobin 11.0; Platelets 162 09/27/2021: NT-Pro BNP 4,615 11/16/2021: BUN 50; Creatinine, Ser 1.60; Potassium 4.8; Sodium 137  Recent Lipid Panel    Component Value Date/Time   LDLDIRECT 53 08/06/2021 1337   Physical Exam:    VS:  BP 112/60   Pulse 71   Ht 4' 9.5" (1.461 m)   Wt 210 lb (95.3 kg)   SpO2 96%   BMI 44.66 kg/m     Wt Readings from Last 3 Encounters:  04/12/22 210 lb (95.3 kg)  11/16/21 177 lb 9.6 oz (80.6 kg)  08/06/21 185 lb 3.2 oz (84 kg)     GEN:  Well nourished, well developed in no acute distress HEENT: Normal NECK: No JVD; No carotid bruits LYMPHATICS: No lymphadenopathy CARDIAC: RRR, no murmurs, rubs, gallops RESPIRATORY: Faint crackles on left base  ABDOMEN: Soft, non-tender, distended MUSCULOSKELETAL: 3+ lower extremity edema up to thigh; No deformity  SKIN: Warm and dry NEUROLOGIC:  Alert and oriented x 3 PSYCHIATRIC:  Normal affect   ASSESSMENT AND PLAN:    CAD No chest pain.  Dual antiplatelet therapy as below.  Continue Coreg, Imdur and statin.  2. ICM/acute on chronic systolic and diastolic CHF - Echo with LVEF of 45-50% on 03/11/2022 at outside hospital  -Significant volume overload on exam -She has about 25 pound weight gain from baseline -Continue current dose of carvedilol 6.25 mg twice daily, Farxiga 10 mg daily, Entresto 24/26 mg twice daily -Increase torsemide to 40 mg twice daily  3. Ischemic stroke - On DAPT until 9/14 then plavix alone - Will discuss monitor at follow up   4. HTN -Blood pressure stable  5. CKD IB - Baseline Scr around 1.9   I will check labs today.  Volume overload on exam and symptoms.  If significant abnormality she needs to go to ER.  She will  follow-up with kidney doctor @ [facility] with blood work on 9/25.  I will see her back afterwards.   Medication Adjustments/Labs and Tests  Ordered: Current medicines are reviewed at length with the patient today.  Concerns regarding medicines are outlined above.  Orders Placed This Encounter  Procedures   Basic Metabolic Panel (BMET)   CBC   Pro b natriuretic peptide (BNP)[facility])   Meds ordered this encounter  Medications   torsemide 40 MG TABS    Sig: Take 40 mg by mouth 2 (two) times daily.    Dispense:  60 tablet    Refill:  1    Please d/c old script with instruction of 20 mg am and 10 pm. Thanks    Patient Instructions  Medication Instructions:  INCREASE Torsemide to 40mg  Take 1 tablet twice a day  *If you need a refill on your cardiac medications before your next appointment, please call your pharmacy*   Lab Work: TODAY-BMET, BNP, CBC If you have labs (blood work) drawn today and your tests are completely normal, you will receive your results only by: [place] (if you have MyChart) OR A paper copy in the mail If you have any lab test that is abnormal or we need to change your treatment, we will call you to review the results.   Testing/Procedures: NONE ORDERED   Follow-Up: At [facility], you and your health needs are our priority.  As part of our continuing mission to provide you with exceptional heart care, we have created designated Provider Care Teams.  These Care Teams include your primary Cardiologist (physician) and Advanced Practice Providers (APPs -  Physician Assistants and Nurse Practitioners) who all work together to provide you with the care you need, when you need it.  We recommend signing up for the patient portal called "MyChart".  Sign up information is provided on this After Visit Summary.  MyChart is used to connect with patients for Virtual Visits (Telemedicine).  Patients are able to view lab/test results, encounter notes, upcoming appointments, etc.  Non-urgent messages can be sent to your provider as well.   To learn more about what you can  do with MyChart, go to [email or web address].    Your next appointment:   3 week(s)  The format for your next appointment:   In Person  Provider:   Robbie Lis, PA-C      Other Instructions   Important Information About Sugar         Jarrett Soho, PA  04/12/2022 2:40 PM    [place]

## 2022-04-12 NOTE — Patient Instructions (Signed)
Medication Instructions:  INCREASE Torsemide to 40mg  Take 1 tablet twice a day  *If you need a refill on your cardiac medications before your next appointment, please call your pharmacy*   Lab Work: TODAY-BMET, BNP, CBC If you have labs (blood work) drawn today and your tests are completely normal, you will receive your results only by: [place] (if you have MyChart) OR A paper copy in the mail If you have any lab test that is abnormal or we need to change your treatment, we will call you to review the results.   Testing/Procedures: NONE ORDERED   Follow-Up: At [facility], you and your health needs are our priority.  As part of our continuing mission to provide you with exceptional heart care, we have created designated Provider Care Teams.  These Care Teams include your primary Cardiologist (physician) and Advanced Practice Providers (APPs -  Physician Assistants and Nurse Practitioners) who all work together to provide you with the care you need, when you need it.  We recommend signing up for the patient portal called "MyChart".  Sign up information is provided on this After Visit Summary.  MyChart is used to connect with patients for Virtual Visits (Telemedicine).  Patients are able to view lab/test results, encounter notes, upcoming appointments, etc.  Non-urgent messages can be sent to your provider as well.   To learn more about what you can do with MyChart, go to [email or web address].    Your next appointment:   3 week(s)  The format for your next appointment:   In Person  Provider:   Robbie Lis, PA-C      Other Instructions   Important Information About Sugar

## 2022-04-13 ENCOUNTER — Telehealth: Payer: Self-pay | Admitting: Physician Assistant

## 2022-04-13 LAB — BASIC METABOLIC PANEL
BUN/Creatinine Ratio: 14 (ref 12–28)
BUN: 30 mg/dL — ABNORMAL HIGH (ref 8–27)
CO2: 25 mmol/L (ref 20–29)
Calcium: 9.2 mg/dL (ref 8.7–10.3)
Chloride: 99 mmol/L (ref 96–106)
Creatinine, Ser: 2.16 mg/dL — ABNORMAL HIGH (ref 0.57–1.00)
Glucose: 188 mg/dL — ABNORMAL HIGH (ref 70–99)
Potassium: 3.6 mmol/L (ref 3.5–5.2)
Sodium: 140 mmol/L (ref 134–144)
eGFR: 24 mL/min/{1.73_m2} — ABNORMAL LOW (ref >59)

## 2022-04-13 LAB — PRO B NATRIURETIC PEPTIDE: NT-Pro BNP: 13246 pg/mL — ABNORMAL HIGH (ref 0–301)

## 2022-04-13 LAB — CBC
Hematocrit: 29.1 % — ABNORMAL LOW (ref 34.0–46.6)
Hemoglobin: 9.4 g/dL — ABNORMAL LOW (ref 11.1–15.9)
MCH: 29.6 pg (ref 26.6–33.0)
MCHC: 32.3 g/dL (ref 31.5–35.7)
MCV: 92 fL (ref 79–97)
Platelets: 240 10*3/uL (ref 150–450)
RBC: 3.18 x10E6/uL — ABNORMAL LOW (ref 3.77–5.28)
RDW: 14.5 % (ref 11.7–15.4)
WBC: 7.3 10*3/uL (ref 3.4–10.8)

## 2022-04-13 NOTE — Telephone Encounter (Signed)
Nurse from rehab that pt is currently in calling in regards to pt's appt yesterday. Please advise

## 2022-04-14 NOTE — Telephone Encounter (Signed)
[facility] calling back to say that they are concerned about pt's weight gain and kidney function with med change. Please advise.   Weight 188 lbs - without wheelchair.   They are faxing weight and additional information.

## 2022-04-14 NOTE — Addendum Note (Signed)
Addended by: Ulice Brilliant T on: 04/14/2022 08:56 AM   Modules accepted: Orders

## 2022-04-14 NOTE — Telephone Encounter (Signed)
Called Caryl Pina at [facility]. Informed her of lab results and faxed copy of results. Sent copy to PCP and Nephrology.

## 2022-04-18 ENCOUNTER — Inpatient Hospital Stay (HOSPITAL_COMMUNITY)
Admission: EM | Admit: 2022-04-18 | Discharge: 2022-05-03 | DRG: 291 | Disposition: A | Discharge status: Skilled Nursing Facility | Payer: Medicare Other | Source: Skilled Nursing Facility | Attending: Internal Medicine | Admitting: Internal Medicine

## 2022-04-18 ENCOUNTER — Other Ambulatory Visit: Payer: Self-pay

## 2022-04-18 ENCOUNTER — Encounter (HOSPITAL_COMMUNITY): Payer: Self-pay

## 2022-04-18 ENCOUNTER — Emergency Department (HOSPITAL_COMMUNITY): Payer: Medicare Other

## 2022-04-18 DIAGNOSIS — Z8673 Personal history of transient ischemic attack (TIA), and cerebral infarction without residual deficits: Secondary | ICD-10-CM

## 2022-04-18 DIAGNOSIS — Z8249 Family history of ischemic heart disease and other diseases of the circulatory system: Secondary | ICD-10-CM

## 2022-04-18 DIAGNOSIS — I5043 Acute on chronic combined systolic (congestive) and diastolic (congestive) heart failure: Secondary | ICD-10-CM | POA: Diagnosis present

## 2022-04-18 DIAGNOSIS — I130 Hypertensive heart and chronic kidney disease with heart failure and stage 1 through stage 4 chronic kidney disease, or unspecified chronic kidney disease: Secondary | ICD-10-CM | POA: Diagnosis not present

## 2022-04-18 DIAGNOSIS — E1122 Type 2 diabetes mellitus with diabetic chronic kidney disease: Secondary | ICD-10-CM | POA: Diagnosis present

## 2022-04-18 DIAGNOSIS — Z79899 Other long term (current) drug therapy: Secondary | ICD-10-CM

## 2022-04-18 DIAGNOSIS — S81802D Unspecified open wound, left lower leg, subsequent encounter: Secondary | ICD-10-CM | POA: Diagnosis not present

## 2022-04-18 DIAGNOSIS — Z888 Allergy status to other drugs, medicaments and biological substances status: Secondary | ICD-10-CM

## 2022-04-18 DIAGNOSIS — R350 Frequency of micturition: Secondary | ICD-10-CM | POA: Diagnosis present

## 2022-04-18 DIAGNOSIS — E1151 Type 2 diabetes mellitus with diabetic peripheral angiopathy without gangrene: Secondary | ICD-10-CM | POA: Diagnosis present

## 2022-04-18 DIAGNOSIS — M5430 Sciatica, unspecified side: Secondary | ICD-10-CM | POA: Diagnosis present

## 2022-04-18 DIAGNOSIS — E876 Hypokalemia: Secondary | ICD-10-CM | POA: Diagnosis present

## 2022-04-18 DIAGNOSIS — M810 Age-related osteoporosis without current pathological fracture: Secondary | ICD-10-CM | POA: Diagnosis present

## 2022-04-18 DIAGNOSIS — Z794 Long term (current) use of insulin: Secondary | ICD-10-CM

## 2022-04-18 DIAGNOSIS — I509 Heart failure, unspecified: Secondary | ICD-10-CM | POA: Diagnosis present

## 2022-04-18 DIAGNOSIS — I872 Venous insufficiency (chronic) (peripheral): Secondary | ICD-10-CM | POA: Diagnosis present

## 2022-04-18 DIAGNOSIS — R609 Edema, unspecified: Secondary | ICD-10-CM | POA: Diagnosis not present

## 2022-04-18 DIAGNOSIS — I998 Other disorder of circulatory system: Secondary | ICD-10-CM | POA: Diagnosis not present

## 2022-04-18 DIAGNOSIS — J4520 Mild intermittent asthma, uncomplicated: Secondary | ICD-10-CM | POA: Diagnosis present

## 2022-04-18 DIAGNOSIS — I878 Other specified disorders of veins: Secondary | ICD-10-CM | POA: Diagnosis present

## 2022-04-18 DIAGNOSIS — M25512 Pain in left shoulder: Secondary | ICD-10-CM | POA: Diagnosis not present

## 2022-04-18 DIAGNOSIS — Z841 Family history of disorders of kidney and ureter: Secondary | ICD-10-CM

## 2022-04-18 DIAGNOSIS — E11649 Type 2 diabetes mellitus with hypoglycemia without coma: Secondary | ICD-10-CM | POA: Diagnosis not present

## 2022-04-18 DIAGNOSIS — I739 Peripheral vascular disease, unspecified: Secondary | ICD-10-CM | POA: Diagnosis present

## 2022-04-18 DIAGNOSIS — Z820 Family history of epilepsy and other diseases of the nervous system: Secondary | ICD-10-CM

## 2022-04-18 DIAGNOSIS — S81802A Unspecified open wound, left lower leg, initial encounter: Secondary | ICD-10-CM

## 2022-04-18 DIAGNOSIS — E785 Hyperlipidemia, unspecified: Secondary | ICD-10-CM | POA: Diagnosis present

## 2022-04-18 DIAGNOSIS — I5020 Unspecified systolic (congestive) heart failure: Secondary | ICD-10-CM | POA: Diagnosis not present

## 2022-04-18 DIAGNOSIS — Z91018 Allergy to other foods: Secondary | ICD-10-CM

## 2022-04-18 DIAGNOSIS — F411 Generalized anxiety disorder: Secondary | ICD-10-CM | POA: Diagnosis present

## 2022-04-18 DIAGNOSIS — Z6841 Body Mass Index (BMI) 40.0 and over, adult: Secondary | ICD-10-CM | POA: Diagnosis not present

## 2022-04-18 DIAGNOSIS — N179 Acute kidney failure, unspecified: Secondary | ICD-10-CM | POA: Diagnosis not present

## 2022-04-18 DIAGNOSIS — Z803 Family history of malignant neoplasm of breast: Secondary | ICD-10-CM

## 2022-04-18 DIAGNOSIS — E1165 Type 2 diabetes mellitus with hyperglycemia: Secondary | ICD-10-CM | POA: Diagnosis not present

## 2022-04-18 DIAGNOSIS — E871 Hypo-osmolality and hyponatremia: Secondary | ICD-10-CM | POA: Diagnosis present

## 2022-04-18 DIAGNOSIS — I255 Ischemic cardiomyopathy: Secondary | ICD-10-CM | POA: Diagnosis present

## 2022-04-18 DIAGNOSIS — J4489 Other specified chronic obstructive pulmonary disease: Secondary | ICD-10-CM | POA: Diagnosis present

## 2022-04-18 DIAGNOSIS — Z7984 Long term (current) use of oral hypoglycemic drugs: Secondary | ICD-10-CM

## 2022-04-18 DIAGNOSIS — I2510 Atherosclerotic heart disease of native coronary artery without angina pectoris: Secondary | ICD-10-CM | POA: Diagnosis present

## 2022-04-18 DIAGNOSIS — I25810 Atherosclerosis of coronary artery bypass graft(s) without angina pectoris: Secondary | ICD-10-CM | POA: Diagnosis present

## 2022-04-18 DIAGNOSIS — K219 Gastro-esophageal reflux disease without esophagitis: Secondary | ICD-10-CM | POA: Diagnosis present

## 2022-04-18 DIAGNOSIS — I493 Ventricular premature depolarization: Secondary | ICD-10-CM | POA: Diagnosis present

## 2022-04-18 DIAGNOSIS — G4700 Insomnia, unspecified: Secondary | ICD-10-CM | POA: Diagnosis present

## 2022-04-18 DIAGNOSIS — R32 Unspecified urinary incontinence: Secondary | ICD-10-CM | POA: Diagnosis present

## 2022-04-18 DIAGNOSIS — I10 Essential (primary) hypertension: Secondary | ICD-10-CM | POA: Diagnosis present

## 2022-04-18 DIAGNOSIS — I5023 Acute on chronic systolic (congestive) heart failure: Secondary | ICD-10-CM | POA: Diagnosis not present

## 2022-04-18 DIAGNOSIS — Z823 Family history of stroke: Secondary | ICD-10-CM

## 2022-04-18 DIAGNOSIS — N1832 Chronic kidney disease, stage 3b: Secondary | ICD-10-CM | POA: Diagnosis not present

## 2022-04-18 DIAGNOSIS — Z833 Family history of diabetes mellitus: Secondary | ICD-10-CM

## 2022-04-18 DIAGNOSIS — G4733 Obstructive sleep apnea (adult) (pediatric): Secondary | ICD-10-CM

## 2022-04-18 DIAGNOSIS — N184 Chronic kidney disease, stage 4 (severe): Secondary | ICD-10-CM | POA: Diagnosis present

## 2022-04-18 DIAGNOSIS — Z9181 History of falling: Secondary | ICD-10-CM

## 2022-04-18 LAB — CBC WITH DIFFERENTIAL/PLATELET
Abs Immature Granulocytes: 0.04 10*3/uL (ref 0.00–0.07)
Basophils Absolute: 0.1 10*3/uL (ref 0.0–0.1)
Basophils Relative: 1 %
Eosinophils Absolute: 0.3 10*3/uL (ref 0.0–0.5)
Eosinophils Relative: 4 %
HCT: 30.2 % — ABNORMAL LOW (ref 36.0–46.0)
Hemoglobin: 9.8 g/dL — ABNORMAL LOW (ref 12.0–15.0)
Immature Granulocytes: 1 %
Lymphocytes Relative: 17 %
Lymphs Abs: 1.2 10*3/uL (ref 0.7–4.0)
MCH: 30.2 pg (ref 26.0–34.0)
MCHC: 32.5 g/dL (ref 30.0–36.0)
MCV: 92.9 fL (ref 80.0–100.0)
Monocytes Absolute: 0.7 10*3/uL (ref 0.1–1.0)
Monocytes Relative: 10 %
Neutro Abs: 4.9 10*3/uL (ref 1.7–7.7)
Neutrophils Relative %: 67 %
Platelets: 165 10*3/uL (ref 150–400)
RBC: 3.25 MIL/uL — ABNORMAL LOW (ref 3.87–5.11)
RDW: 16.9 % — ABNORMAL HIGH (ref 11.5–15.5)
WBC: 7.2 10*3/uL (ref 4.0–10.5)
nRBC: 0 % (ref 0.0–0.2)

## 2022-04-18 LAB — BRAIN NATRIURETIC PEPTIDE: B Natriuretic Peptide: 2427.9 pg/mL — ABNORMAL HIGH (ref 0.0–100.0)

## 2022-04-18 LAB — BASIC METABOLIC PANEL
Anion gap: 12 (ref 5–15)
BUN: 40 mg/dL — ABNORMAL HIGH (ref 8–23)
CO2: 27 mmol/L (ref 22–32)
Calcium: 9.1 mg/dL (ref 8.9–10.3)
Chloride: 99 mmol/L (ref 98–111)
Creatinine, Ser: 2.5 mg/dL — ABNORMAL HIGH (ref 0.44–1.00)
GFR, Estimated: 20 mL/min — ABNORMAL LOW (ref >60)
Glucose, Bld: 182 mg/dL — ABNORMAL HIGH (ref 70–99)
Potassium: 3.3 mmol/L — ABNORMAL LOW (ref 3.5–5.1)
Sodium: 138 mmol/L (ref 135–145)

## 2022-04-18 LAB — LACTIC ACID, PLASMA: Lactic Acid, Venous: 1.4 mmol/L (ref 0.5–1.9)

## 2022-04-18 LAB — HEPATIC FUNCTION PANEL
ALT: 18 U/L (ref 0–44)
AST: 23 U/L (ref 15–41)
Albumin: 2.9 g/dL — ABNORMAL LOW (ref 3.5–5.0)
Alkaline Phosphatase: 223 U/L — ABNORMAL HIGH (ref 38–126)
Bilirubin, Direct: 0.4 mg/dL — ABNORMAL HIGH (ref 0.0–0.2)
Indirect Bilirubin: 0.6 mg/dL (ref 0.3–0.9)
Total Bilirubin: 1 mg/dL (ref 0.3–1.2)
Total Protein: 6.6 g/dL (ref 6.5–8.1)

## 2022-04-18 LAB — TROPONIN I (HIGH SENSITIVITY)
Troponin I (High Sensitivity): 20 ng/L — ABNORMAL HIGH (ref <18)
Troponin I (High Sensitivity): 18 ng/L — ABNORMAL HIGH (ref <18)

## 2022-04-18 LAB — HEMOGLOBIN A1C
Hgb A1c MFr Bld: 9.4 % — ABNORMAL HIGH (ref 4.8–5.6)
Mean Plasma Glucose: 223.08 mg/dL

## 2022-04-18 LAB — LIPID PANEL
Cholesterol: 100 mg/dL (ref 0–200)
HDL: 52 mg/dL (ref >40)
LDL Cholesterol: 37 mg/dL (ref 0–99)
Total CHOL/HDL Ratio: 1.9 RATIO
Triglycerides: 56 mg/dL (ref <150)
VLDL: 11 mg/dL (ref 0–40)

## 2022-04-18 LAB — HIV ANTIBODY (ROUTINE TESTING W REFLEX): HIV Screen 4th Generation wRfx: NONREACTIVE

## 2022-04-18 LAB — MAGNESIUM: Magnesium: 1.7 mg/dL (ref 1.7–2.4)

## 2022-04-18 LAB — GLUCOSE, CAPILLARY
Glucose-Capillary: 148 mg/dL — ABNORMAL HIGH (ref 70–99)
Glucose-Capillary: 144 mg/dL — ABNORMAL HIGH (ref 70–99)

## 2022-04-18 LAB — PHOSPHORUS: Phosphorus: 2.9 mg/dL (ref 2.5–4.6)

## 2022-04-18 MED ORDER — POTASSIUM CHLORIDE CRYS ER 20 MEQ PO TBCR
40.0000 meq | EXTENDED_RELEASE_TABLET | Freq: Every day | ORAL | Status: DC
  Administered 2022-04-18: 40 meq via ORAL
  Filled 2022-04-18: qty 2

## 2022-04-18 MED ORDER — ASPIRIN 81 MG PO TBEC: 81.0000 mg | DELAYED_RELEASE_TABLET | Freq: Every day | ORAL | Status: DC

## 2022-04-18 MED ORDER — ISOSORBIDE MONONITRATE ER 30 MG PO TB24: 30.0000 mg | ORAL_TABLET | Freq: Every day | ORAL | Status: DC

## 2022-04-18 MED ORDER — FUROSEMIDE 10 MG/ML IJ SOLN: 40.0000 mg | Freq: Two times a day (BID) | INTRAMUSCULAR | Status: DC

## 2022-04-18 MED ORDER — ENOXAPARIN SODIUM 30 MG/0.3ML IJ SOSY
30.0000 mg | PREFILLED_SYRINGE | INTRAMUSCULAR | Status: DC
  Administered 2022-04-18 – 2022-05-02 (×15): 30 mg via SUBCUTANEOUS
  Filled 2022-04-18 (×15): qty 0.3

## 2022-04-18 MED ORDER — INSULIN ASPART 100 UNIT/ML IJ SOLN
0.0000 [IU] | Freq: Three times a day (TID) | INTRAMUSCULAR | Status: DC
  Administered 2022-04-18 – 2022-04-19 (×2): 2 [IU] via SUBCUTANEOUS
  Administered 2022-04-19: 3 [IU] via SUBCUTANEOUS
  Administered 2022-04-20: 2 [IU] via SUBCUTANEOUS
  Administered 2022-04-21 (×2): 3 [IU] via SUBCUTANEOUS
  Administered 2022-04-22: 2 [IU] via SUBCUTANEOUS
  Administered 2022-04-22: 3 [IU] via SUBCUTANEOUS
  Administered 2022-04-22: 5 [IU] via SUBCUTANEOUS
  Administered 2022-04-23: 2 [IU] via SUBCUTANEOUS
  Administered 2022-04-23 (×2): 5 [IU] via SUBCUTANEOUS
  Administered 2022-04-24 (×2): 3 [IU] via SUBCUTANEOUS
  Administered 2022-04-24: 5 [IU] via SUBCUTANEOUS
  Administered 2022-04-25 (×2): 3 [IU] via SUBCUTANEOUS
  Administered 2022-04-25 – 2022-04-26 (×2): 5 [IU] via SUBCUTANEOUS
  Administered 2022-04-26: 2 [IU] via SUBCUTANEOUS
  Administered 2022-04-26 – 2022-04-27 (×3): 3 [IU] via SUBCUTANEOUS
  Administered 2022-04-27: 2 [IU] via SUBCUTANEOUS
  Administered 2022-04-28: 5 [IU] via SUBCUTANEOUS
  Administered 2022-04-28: 3 [IU] via SUBCUTANEOUS
  Administered 2022-04-28 – 2022-04-29 (×2): 5 [IU] via SUBCUTANEOUS
  Administered 2022-04-29: 3 [IU] via SUBCUTANEOUS
  Administered 2022-04-29: 8 [IU] via SUBCUTANEOUS
  Administered 2022-04-30: 2 [IU] via SUBCUTANEOUS
  Administered 2022-04-30: 5 [IU] via SUBCUTANEOUS
  Administered 2022-04-30 – 2022-05-01 (×3): 3 [IU] via SUBCUTANEOUS
  Administered 2022-05-02: 5 [IU] via SUBCUTANEOUS
  Administered 2022-05-03 (×2): 3 [IU] via SUBCUTANEOUS

## 2022-04-18 MED ORDER — ROSUVASTATIN CALCIUM 20 MG PO TABS
40.0000 mg | ORAL_TABLET | Freq: Every day | ORAL | Status: DC
  Administered 2022-04-18 – 2022-05-02 (×15): 40 mg via ORAL
  Filled 2022-04-18 (×15): qty 2

## 2022-04-18 MED ORDER — MOMETASONE FURO-FORMOTEROL FUM 200-5 MCG/ACT IN AERO
2.0000 | INHALATION_SPRAY | Freq: Two times a day (BID) | RESPIRATORY_TRACT | Status: DC
  Administered 2022-04-19 – 2022-05-03 (×26): 2 via RESPIRATORY_TRACT
  Filled 2022-04-18: qty 8.8

## 2022-04-18 MED ORDER — ACETAMINOPHEN 650 MG RE SUPP: 650.0000 mg | Freq: Four times a day (QID) | RECTAL | Status: DC | PRN

## 2022-04-18 MED ORDER — ONDANSETRON HCL 4 MG PO TABS: 4.0000 mg | ORAL_TABLET | Freq: Four times a day (QID) | ORAL | Status: DC | PRN

## 2022-04-18 MED ORDER — CLOPIDOGREL BISULFATE 75 MG PO TABS
75.0000 mg | ORAL_TABLET | Freq: Every day | ORAL | Status: DC
  Administered 2022-04-18 – 2022-05-03 (×16): 75 mg via ORAL
  Filled 2022-04-18 (×16): qty 1

## 2022-04-18 MED ORDER — HYDRALAZINE HCL 10 MG PO TABS
10.0000 mg | ORAL_TABLET | Freq: Three times a day (TID) | ORAL | Status: DC
  Administered 2022-04-18 – 2022-05-03 (×45): 10 mg via ORAL
  Filled 2022-04-18 (×45): qty 1

## 2022-04-18 MED ORDER — CARVEDILOL 6.25 MG PO TABS
6.2500 mg | ORAL_TABLET | Freq: Two times a day (BID) | ORAL | Status: DC
  Administered 2022-04-18 – 2022-05-03 (×29): 6.25 mg via ORAL
  Filled 2022-04-18 (×30): qty 1

## 2022-04-18 MED ORDER — INSULIN ASPART 100 UNIT/ML IJ SOLN: 0.0000 [IU] | Freq: Three times a day (TID) | INTRAMUSCULAR | Status: DC

## 2022-04-18 MED ORDER — PANTOPRAZOLE SODIUM 40 MG PO TBEC
40.0000 mg | DELAYED_RELEASE_TABLET | Freq: Every day | ORAL | Status: DC
  Administered 2022-04-18 – 2022-05-03 (×16): 40 mg via ORAL
  Filled 2022-04-18 (×16): qty 1

## 2022-04-18 MED ORDER — INSULIN ASPART 100 UNIT/ML IJ SOLN
6.0000 [IU] | Freq: Three times a day (TID) | INTRAMUSCULAR | Status: DC
  Administered 2022-04-18 – 2022-04-20 (×5): 6 [IU] via SUBCUTANEOUS

## 2022-04-18 MED ORDER — ONDANSETRON HCL 4 MG/2ML IJ SOLN: 4.0000 mg | Freq: Four times a day (QID) | INTRAMUSCULAR | Status: DC | PRN

## 2022-04-18 MED ORDER — ACETAMINOPHEN 325 MG PO TABS
650.0000 mg | ORAL_TABLET | Freq: Four times a day (QID) | ORAL | Status: DC | PRN
  Administered 2022-04-18 – 2022-05-03 (×18): 650 mg via ORAL
  Filled 2022-04-18 (×19): qty 2

## 2022-04-18 MED ORDER — SENNOSIDES-DOCUSATE SODIUM 8.6-50 MG PO TABS: 1.0000 | ORAL_TABLET | Freq: Every evening | ORAL | Status: DC | PRN

## 2022-04-18 MED ORDER — FUROSEMIDE 10 MG/ML IJ SOLN
80.0000 mg | Freq: Once | INTRAMUSCULAR | Status: AC
  Administered 2022-04-18: 80 mg via INTRAVENOUS
  Filled 2022-04-18: qty 8

## 2022-04-18 MED ORDER — INSULIN GLARGINE-YFGN 100 UNIT/ML ~~LOC~~ SOLN
15.0000 [IU] | Freq: Every day | SUBCUTANEOUS | Status: DC
  Administered 2022-04-18: 15 [IU] via SUBCUTANEOUS
  Filled 2022-04-18 (×2): qty 0.15

## 2022-04-18 NOTE — ED Triage Notes (Signed)
Sent by [facility] facility after patient having increased leg swelling and cellulitis to left leg that was recently drained due to an abscess. Patient having sob and cp.  Nursing home reports patient has had AKI and tried to increased lasix but patient refused.

## 2022-04-18 NOTE — ED Provider Notes (Signed)
[facility] EMERGENCY DEPARTMENT Provider Note   CSN: [id number] Arrival date & time: 04/18/22  1005     History  Chief Complaint  Patient presents with   Chest Pain   Shortness of Breath   Recurrent Skin Infections    Alexandra Baker is a 71 y.o. female.   Chest Pain Associated symptoms: shortness of breath   Shortness of Breath Associated symptoms: chest pain   Patient presents with shortness of breath.  Comes from nursing home.  History of CHF.  Has been still put on weight despite increasing doses of diuretics.  Is on torsemide 40 mg.  Creatinine has been increasing.  Still more short of breath and states over the weekend she is put on additional 6 pounds.  States her abdomen is start to weep at x2.  Previous infection on left lower leg also that she is worried about states she cannot see the wound.  No fevers.  States she cannot sleep.  States some of it may be due to inability to lay flat.    Past Medical History:  Diagnosis Date   Age-related osteoporosis without current pathological fracture 06/23/2020   Formatting of this note might be different from the original. Bone density January 2021 T score -2.6 to right hip -1.1 to lumbar spine patient refusing treatment   Asthmatic bronchitis without complication 93/23/5573   Managed by dr. Alcide Clever   Benign essential hypertension 05/11/2016   Cardiomyopathy ([place]) ejection fraction 4045% in November 2020 07/18/2019   Cellulitis of left lower extremity 05/29/2019   Chronic combined systolic and diastolic CHF (congestive heart failure) ([place]) 12/31/2019   Seeing cards in [place]   Congestive heart failure ([place]) New York Heart Association class III 07/18/2019   Coronary disease 07/18/2019   Dyslipidemia 12/25/2019   GAD (generalized anxiety disorder) 07/18/2019   GERD (gastroesophageal reflux disease) 07/18/2019   Heme positive stool 06/20/2018   Refuses colonoscopy   Hyperlipidemia 05/11/2016   Hypertension  07/18/2019   Ischemic cardiomyopathy 12/31/2019   Medically noncompliant 09/12/2018   Memory loss 06/20/2018   5/5 mini cog refuses neuro eval Since heart surgery 11/19   Mild intermittent asthma without complication 22/09/5425   Managed by dr. Alcide Clever   Severe obesity (BMI 35.0-39.9) with comorbidity ([place]) 12/30/2020   Stage 2 chronic kidney disease 02/14/2017   Stage 3b chronic kidney disease ([place]) 02/14/2017   Status post coronary artery bypass graft 1999 done at [facility] 07/18/2019   Tremor 06/20/2018   Declines neuro eval   Uncontrolled type 2 diabetes mellitus with hyperglycemia ([place]) 05/11/2016   Uncontrolled type 2 diabetes mellitus with microalbuminuria, with long-term current use of insulin 07/18/2019   Managed by dr. Tamala Julian endo exclusively  Pt noncompliant with meds and ov    Home Medications Prior to Admission medications   Medication Sig Start Date End Date Taking? Authorizing Provider  acetaminophen (TYLENOL) 325 MG tablet Take 650 mg by mouth every 4 (four) hours as needed for moderate pain or headache.    [provider]  albuterol (VENTOLIN HFA) 108 (90 Base) MCG/ACT inhaler Inhale 2 puffs into the lungs 2 (two) times daily. 07/10/19   [provider]  aspirin EC 81 MG tablet Take 81 mg by mouth daily. Swallow whole.    [provider]  budesonide-formoterol (SYMBICORT) 160-4.5 MCG/ACT inhaler Inhale 2 puffs into the lungs 2 (two) times daily.    [provider]  carvedilol (COREG) 6.25 MG tablet Take 1 tablet (  6.25 mg total) by mouth 2 (two) times daily with a meal. 09/23/21   Park Liter, MD  clopidogrel (PLAVIX) 75 MG tablet Take 1 tablet by mouth daily. 03/14/22 03/14/23  [provider]  dapagliflozin propanediol (FARXIGA) 10 MG TABS tablet Take 1 tablet by mouth daily.    [provider]  diphenhydrAMINE (BENADRYL) 25 mg capsule Take 1 capsule by mouth every 4 (four) hours as needed. 04/05/22    [provider]  hydrALAZINE (APRESOLINE) 10 MG tablet TAKE ONE (1) TABLET BY MOUTH 3 TIMES DAILY 12/13/21   Park Liter, MD  HYDROcodone-acetaminophen (NORCO/VICODIN) 5-325 MG tablet Take 1 tablet by mouth every 12 (twelve) hours as needed for moderate pain.    [provider]  Insulin Glargine, 1 Unit Dial, (TOUJEO SOLOSTAR) 300 UNIT/ML SOPN Inject 12 Units into the skin at bedtime. 05/11/16   [provider]  insulin lispro (HUMALOG) 100 UNIT/ML KwikPen Inject 5-12 Units into the skin 3 (three) times daily with meals. 10/28/19   [provider]  ipratropium-albuterol (DUONEB) 0.5-2.5 (3) MG/3ML SOLN Take 3 mLs by nebulization as needed for shortness of breath or wheezing. 06/29/21   [provider]  isosorbide mononitrate (IMDUR) 30 MG 24 hr tablet Take 1 tablet (30 mg total) by mouth daily. 08/06/21 11/16/21  Park Liter, MD  loratadine (CLARITIN) 10 MG tablet Take 10 mg by mouth daily.    [provider]  melatonin 3 MG TABS tablet Take 3 mg by mouth at bedtime.    [provider]  nitroGLYCERIN (NITROLINGUAL) 0.4 MG/SPRAY spray Place 1 spray under the tongue as needed for chest pain. 12/15/16   [provider]  omeprazole (PRILOSEC) 40 MG capsule Take 40 mg by mouth daily as needed (acid reflux). 08/29/18   [provider]  Probiotic Product (PROBIOTIC & ACIDOPHILUS EX ST PO) Take 1 capsule by mouth daily.    [provider]  promethazine (PHENERGAN) 12.5 MG tablet Take 1 tablet by mouth every 6 (six) hours as needed. 03/02/22   [provider]  rosuvastatin (CRESTOR) 40 MG tablet TAKE ONE (1) TABLET BY MOUTH EVERY DAY Patient taking differently: Take 40 mg by mouth daily. 03/23/21   Park Liter, MD  sacubitril-valsartan (ENTRESTO) 24-26 MG Take 1 tablet by mouth 2 (two) times daily. 01/17/22   Park Liter, MD  senna-docusate (SENOKOT-S) 8.6-50 MG tablet Take 1 tablet by mouth  daily. 04/05/22 05/05/22  [provider]  torsemide 40 MG TABS Take 40 mg by mouth 2 (two) times daily. 04/12/22   Leanor Kail, PA  witch hazel-glycerin (TUCKS) pad Apply 1 Application topically as needed for itching.    [provider]      Allergies    Pitocin [oxytocin], Tea, Wound dressing adhesive, and Ezetimibe-simvastatin    Review of Systems   Review of Systems  Respiratory:  Positive for shortness of breath.   Cardiovascular:  Positive for chest pain.    Physical Exam Updated Vital Signs BP 137/71   Pulse 70   Temp 97.8 F (36.6 C) (Oral)   Resp 19   Ht 4\' 9"  (1.448 m)   Wt 97.5 kg   SpO2 97%   BMI 46.53 kg/m  Physical Exam Vitals reviewed.  Cardiovascular:     Rate and Rhythm: Regular rhythm.  Pulmonary:     Comments: Some rales at the bases. Chest:     Chest wall: No tenderness.  Abdominal:     Tenderness:  There is abdominal tenderness.     Comments: Anasarca up to abdomen.  Musculoskeletal:     Cervical back: Normal range of motion.     Right lower leg: Edema present.     Left lower leg: Edema present.     Comments: Moderate to severe edema bilateral extremities.  Healing wounds on left lower extremity with what appears to be good healing.  No fluctuance and only minimal erythema.  Skin:    Capillary Refill: Capillary refill takes less than 2 seconds.     ED Results / Procedures / Treatments   Labs (all labs ordered are listed, but only abnormal results are displayed) Labs Reviewed  BASIC METABOLIC PANEL - Abnormal; Notable for the following components:      Result Value   Potassium 3.3 (*)    Glucose, Bld 182 (*)    BUN 40 (*)    Creatinine, Ser 2.50 (*)    GFR, Estimated 20 (*)    All other components within normal limits  CBC WITH DIFFERENTIAL/PLATELET - Abnormal; Notable for the following components:   RBC 3.25 (*)    Hemoglobin 9.8 (*)    HCT 30.2 (*)    RDW 16.9 (*)    All other components within normal limits   HEPATIC FUNCTION PANEL - Abnormal; Notable for the following components:   Albumin 2.9 (*)    Alkaline Phosphatase 223 (*)    Bilirubin, Direct 0.4 (*)    All other components within normal limits  TROPONIN I (HIGH SENSITIVITY) - Abnormal; Notable for the following components:   Troponin I (High Sensitivity) 20 (*)    All other components within normal limits  BRAIN NATRIURETIC PEPTIDE  TROPONIN I (HIGH SENSITIVITY)    EKG EKG Interpretation  Date/Time:  Monday April 18 2022 10:14:59 EDT Ventricular Rate:  71 PR Interval:  166 QRS Duration: 92 QT Interval:  380 QTC Calculation: 412 R Axis:   47 Text Interpretation: Normal sinus rhythm Low voltage QRS Cannot rule out Anterior infarct , age undetermined Marked ST abnormality, possible lateral subendocardial injury Abnormal ECG No previous ECGs available Confirmed by Davonna Belling [phone]) on 04/18/2022 10:55:05 AM  Radiology DG Chest 2 View  Result Date: 04/18/2022 CLINICAL DATA:  Chest pain, short of breath EXAM: CHEST - 2 VIEW COMPARISON:  None Available. FINDINGS: Sternotomy wires overlie enlarged cardiac silhouette. Moderate sized pleural effusions noted. No pulmonary edema. No pneumothorax. No acute osseous abnormality. IMPRESSION: Cardiomegaly and bilateral pleural effusions. Electronically Signed   By: Suzy Bouchard M.D.   On: 04/18/2022 10:46    Procedures Procedures    Medications Ordered in ED Medications - No data to display  ED Course/ Medical Decision Making/ A&P                           Medical Decision Making  Patient with worsening edema.  Is on diuretics at the nursing home already.  However still worsening edema.  Creatinine also increasing.  Mild anemia but appears stable to prior about a week ago.  X-ray does show some volume overload with pleural effusions and cardiomegaly.  With increasing creatinine and worsening edema despite diuresis and feel she would benefit from mission to the hospital.   Will discuss with unassigned medicine for admission.       Final Clinical Impression(s) / ED Diagnoses Final diagnoses:  Acute on chronic congestive heart failure, unspecified heart failure type ([place])  AKI (acute kidney injury) ([place])  Rx / DC Orders ED Discharge Orders     None         Davonna Belling, MD 04/18/22 1235

## 2022-04-18 NOTE — Hospital Course (Addendum)
Patient reports falling three months ago and hurt her leg. Had cellulitis and abscess in leg that got drained and was also treated with antibiotics. Went to [facility] and weighed 162 pounds; next day at [facility] they gave her hydrocodone, morphine, roxy and also had a stroke (before the fall?). Got an MRI that confirmed stroke before going back to nursing home. Antibiotics up until 2 weeks ago at [facility] home. 2 courses of antibiotics but leg is still erythematous. She has gained 6 pounds over the weekend. Cardiologist Dr. Raliegh Ip at [facility] told her to get treatment at hospital. Cardiologist had her on Toresemide 40 mg BID, Dr. Luana Shu considered adding another 20 mg. Has CPAP but does not wear it. Usually compliant with medications, got medications at nursing home this morning. Saw nephrologist last in June/July - had fluid then and they put her on Metolazone TID. Reports stomach ulcers last year.  Patient has trouble breathing while lying flat and has difficulty sleeping because of that. Sleeps on 10-in-1 pillow. Has trouble walking and moving around because of her leg. Sciatic nerve in back also limits mobility - walked about 70 feet in PT.  Seldom coughs. A little bit of chest pain and reports shortness of breath. Reports lightheadedness and dizziness. Nausea and vomiting last week. No abdominal pain. No issues with BM (nursing home gave her laxatives). Reports urination has been decreasing, this has been going on for ***. No new sickness symptoms or sick contacts. No major changes in diet.  Baseline weight 162 lbs, last time about a month ago at this weight.  PMH Diabetes Bypass surgery 1999 CKD Leg cellulitis CHF HTN  Social Lives at assisted living facility Daughter near town that she will stay with; hopes to eventually go home to her own house Has a boyfriend, friends in hospital room with her No alc, no tobacco  PE Chronically ill woman in no acute distress. Conversational  and able to answer questions appropriately Legs, tender to palpation  Full code - still thinking about it as well   09/19 Patient feels she is feeling better. Last night she felt that she was having trouble breathing when she is laying down. This  is getting worse for the patient. Patient also concerned about sciatica. Patient had recently had cellulitis and was really bad and she had to get it drained. This was up in [place]. Wound care came in this morning. Patient denies chest pain but reports some shortness of breath. She states that he leg swelling is about the same. She thinks she is urinating more than normal. She states that she still feels that she is having abdominal swelling but might be better than yesterday.     09/20: She is unsure about if the swelling is better and that her leg is still red. She doe know that she his some decreased swelling  in her abdomen. Patient said the breathing has been up and down. Patient slept in the bed last night. At 0330 she sat in the wheelchair. Patient laying down better last night than previously. Patient does endorse some lightheadedness and dizziness, but denies any chest pain. Patient did ambulate better yesterday.   09/21 Patient is reporting that she had a sharp pain in her right shoulder earlier. It is better now. She thinks it might be her shingles. It happens when she sits up. Ambulated yesterday and her breathing got worse while she was walking. Patient denies any chest pain and states that she does have  some phlegm she is coughing up she does get lightheaded/ dizzy when she stands up. Her appetite is not that good still but it is getting better.   9/22 Difficulty laying flat. She walked yesterday and sometimes felt her legs were giving out. Does not feel her leg swelling is changing. Denies chest pain. Reports dizziness and once she stands for a moment, the dizziness goes away.   09/24 Patient is doing better. Patient does report that the  honey stuff burns. She is not back to normal today. Patient thinks that the limiting factor is the swelling on the feet. Does not feel suffocated when laying down. Patient is still requiring 2L O2 at night. Patient came from a rehab facility. Patient has been peeing on and off and got out like 400cc. Patient is able to get up and use the bedside commode.  Still has 3+ pitting edema noted to the lower extremities. Patient does endorse that she is having trouble sleeping and does ask if melatonin is okay.   9-26: Patient has is wearing compressive wraps on both lower extremities. She reports feeling a little cold this morning but otherwise good with less swelling in her legs than when she arrived. Yesterday, she ambulated in the room. Toes warm and well-perfused with good capillary refill. Still has JVD up to mandible. Discussed need for continued diuresis and weight goals for discharge.  9-27: Patient is feeling better with continued diuresis, reports less swelling in her legs. She was sitting comfortably upright in wheelchair with improved demeanor from prior evaluations. Denies any new symptoms including shortness of breath and chest pain. Tentative plan for discharge later this week, possibly Friday.  9-28: Patient reports a good night of sleep and is happy with continued diuresis, feeling good this morning. Discussed plans for intravenous diuretics and monitoring with labs. She denies shortness of breath and has been ambulating to use the bathroom. Dry weight is about 162lbs, currently 174lbs.   9-29: Patient reports urinating on herself yesterday after nursing staff took too long to respond. She felt a burning sensation on her left leg that has since resolved, but the area is still tender to touch. A wound is present on the lateral aspect of her shin. Discussed plan to evaluate blood flow while continuing diuresis.  HFmrEF EF 40%  9-30 [place] of feet Significant urine output ON Breathing  normal  Still have thigh and lower back pitting edema  10/1: L leg hurting  Maybe resume low dose entresto tomorrow

## 2022-04-18 NOTE — ED Notes (Signed)
RN unsuccessful in IV start x2

## 2022-04-18 NOTE — H&P (Signed)
Date: 04/18/2022               Patient Name:  Alexandra Baker MRN: [id number]  DOB: 12/24/1950 Age / Sex: 71 y.o., female   PCP: Alma Friendly, MD         Medical Service: Internal Medicine Teaching Service         Attending Physician: Dr. Eleanora Neighbor    First Contact: Dr. Ferne Coe Pager: [phone]  Second Contact: Dr. Linwood Dibbles, MD Pager: [phone]       After Hours (After 5p/  First Contact Pager: [phone]  weekends / holidays): Second Contact Pager: [phone]   Chief Complaint: shortness of breath  History of Present Illness:  Seen by Leanor Kail PA 9/12 and found to have BNP of 13K and Cr rising to 2.16. At that time recommended continuing torsemide 40 BID and returning to the ED after the weekend if her repeat labs were abnormal,she had gained weight and did not have improvement in her volume status. Patient today was found to have BNP to 2581 and Cr to 1.95 at [facility]. She was then sent to the [place]. Patient has trouble breathing while lying flat and has difficulty sleeping because of that. Weight was 195lbs about a month ago at the neurology office and is now 215lbs in the ED. The patient reports 6 lbs weight gain over the weekend alone.Sleeps on 10-in-1 pillow.Seldom coughs. A little bit of chest pain and reports shortness of breath. Reports lightheadedness and dizziness. Nausea and vomiting last week. No abdominal pain. No issues with BM (nursing home gave her laxatives). Reports urination has been decreasing, this has been going on for what sounds like a few weeks although unclear. No new sickness symptoms or sick contacts. No major changes in diet or fluid intake. Reports taking all of her medications as indicated. Saw nephrologist last in June/July - had fluid then and they put her on Metolazone TID, but she does not think she has been getting this.  Patient reports falling three months ago and hurt her leg. Had cellulitis and abscess in  leg that got drained and was also treated with antibiotics. Re-hospitalized 8/3-8/14/23 for persistent cellulitis of left lower extremity and found to have slurred speech and reported left facial droop. 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     Meds:  No outpatient medications have been marked as taking for the 04/18/22 encounter [facility] Encounter).   aspirin EC 81 MG tablet Take 81 mg by mouth daily. Swallow whole. Iona Coach, MD Reordered  Ordered as: aspirin EC tablet 81 mg 81 mg, Oral, Daily, First dose on Mon 04/18/22 at 1630    budesonide-formoterol (SYMBICORT) 160-4.5 MCG/ACT inhaler Inhale 2 puffs into the lungs 2 (two) times daily. Iona Coach, MD Reordered  Ordered as: mometasone-formoterol [facility]) 200-5 MCG/ACT inhaler 2 puff 2 puff, Inhalation, 2 times daily, First dose on Mon 04/18/22 at 2000    carvedilol (COREG) 6.25 MG tablet Take 1 tablet (6.25 mg total) by mouth 2 (two) times daily with a meal. Iona Coach, MD Reordered  Ordered as: carvedilol (COREG) tablet 6.25 mg 6.25 mg, Oral, 2 times daily with meals, First dose on Mon 04/18/22 at 1700    clopidogrel (PLAVIX) 75 MG tablet Take 1 tablet by mouth daily. Iona Coach, MD Reordered  Ordered as: clopidogrel (PLAVIX) tablet 75 mg 75 mg, Oral, Daily, First dose on Mon 04/18/22 at 1630    dapagliflozin propanediol (FARXIGA) 10 MG TABS tablet Take  1 tablet by mouth daily. Iona Coach, MD Not Ordered  hydrALAZINE (APRESOLINE) 10 MG tablet TAKE ONE (1) TABLET BY MOUTH 3 TIMES DAILY Iona Coach, MD Reordered  Ordered as: hydrALAZINE (APRESOLINE) tablet 10 mg 10 mg, Oral, Every 8 hours, First dose on Mon 04/18/22 at 1630    isosorbide mononitrate (IMDUR) 30 MG 24 hr tablet Take 1 tablet (30 mg total) by mouth daily. Iona Coach, MD Reordered  Ordered as: isosorbide mononitrate (IMDUR) 24 hr tablet 30 mg 30 mg, Oral, Daily, First dose on Mon 04/18/22 at 1630    omeprazole (PRILOSEC) 40 MG capsule Take 40 mg by  mouth daily as needed (acid reflux). Iona Coach, MD Reordered  Ordered as: pantoprazole (PROTONIX) EC tablet 40 mg 40 mg, Oral, Daily, First dose on Mon 04/18/22 at 1630    sacubitril-valsartan (ENTRESTO) 24-26 MG Take 1 tablet by mouth 2 (two) times daily. Iona Coach, MD Not Ordered    Allergies: Allergies as of 04/18/2022 - Review Complete 04/18/2022  Allergen Reaction Noted   Pitocin [oxytocin]  07/18/2019   Tea Hives 10/19/2017   Wound dressing adhesive Hives 03/24/2021   Ezetimibe-simvastatin Diarrhea and Nausea Only 06/22/2011   Past Medical History:  Diagnosis Date   Age-related osteoporosis without current pathological fracture 06/23/2020   Formatting of this note might be different from the original. Bone density January 2021 T score -2.6 to right hip -1.1 to lumbar spine patient refusing treatment   Asthmatic bronchitis without complication 50/04/3817   Managed by dr. Alcide Clever   Benign essential hypertension 05/11/2016   Cardiomyopathy ([place]) ejection fraction 4045% in November 2020 07/18/2019   Cellulitis of left lower extremity 05/29/2019   Chronic combined systolic and diastolic CHF (congestive heart failure) ([place]) 12/31/2019   Seeing cards in [place]   Congestive heart failure ([place]) New York Heart Association class III 07/18/2019   Coronary disease 07/18/2019   Dyslipidemia 12/25/2019   GAD (generalized anxiety disorder) 07/18/2019   GERD (gastroesophageal reflux disease) 07/18/2019   Heme positive stool 06/20/2018   Refuses colonoscopy   Hyperlipidemia 05/11/2016   Hypertension 07/18/2019   Ischemic cardiomyopathy 12/31/2019   Medically noncompliant 09/12/2018   Memory loss 06/20/2018   5/5 mini cog refuses neuro eval Since heart surgery 11/19   Mild intermittent asthma without complication 29/93/7169   Managed by dr. Alcide Clever   Severe obesity (BMI 35.0-39.9) with comorbidity ([place]) 12/30/2020   Stage 2 chronic kidney disease 02/14/2017   Stage 3b chronic kidney disease  ([place]) 02/14/2017   Status post coronary artery bypass graft 1999 [facility] 07/18/2019   Tremor 06/20/2018   Declines neuro eval   Uncontrolled type 2 diabetes mellitus with hyperglycemia ([place]) 05/11/2016   Uncontrolled type 2 diabetes mellitus with microalbuminuria, with long-term current use of insulin 07/18/2019   Managed by dr. Tamala Julian endo exclusively  Pt noncompliant with meds and ov    Family History:  Family History  Problem Relation Age of Onset   Breast cancer Mother    Hypertension Mother    Alzheimer's disease Mother    Kidney failure Mother    Stroke Mother    Aneurysm Father        "Brain Bleed"   Breast cancer Sister    Diabetes Sister    Diabetes Sister    Diabetes Sister      Social History:  Patient currently at [facility]. Unable to ambulate. Moving with her daughter once out of SNF. Denies tobacco or  alcohol use. Endorses low sodium diet.  Review of Systems: A complete ROS was negative except as per HPI.   Physical Exam: Blood pressure (!) 147/62, pulse 66, temperature 98 F (36.7 C), temperature source Oral, resp. rate 19, height 4\' 9"  (1.448 m), weight 97.5 kg, SpO2 96 %. Gen: obese, laying in bed, no acute distress CV: RRR, no m/r/g, normal s1/s2, 2+radial pulses bilaterally, unable to assess JVD due to body habitus Pulm: Normal wob, crackles bilaterally at the bases Abd: diffuse abdominal wall swelling, soft, NT/ND, no wound of the panus Extremities: 2+ pitting edema bilaterally.R LE erythema with multiple medial and posterior scabbed wounds without warmth, oozing. Cool to the touch. 1+ L posterior tibial pulse, unpalpable R PT pulse and DP pulses bilaterally secondary to edema.  EKG: personally reviewed my interpretation is: prominent S wave in v1+ notched R wave in v6 and wide complex may represent LBBB. No discrete ST or T wave changes, overall no change from prior.  CXR: personally reviewed my  interpretation KG:[id number] costophrenic angle blunting and bilateral effusions on lateral cxr, left pleural fissure fluid, increased interstitial markings  Assessment & Plan by Problem: Principal Problem:   Acute exacerbation of congestive heart failure (HCC)  HFmrEF exacerbation Most recent echo 03/11/22 with EF 45-50%.Admission weight at 215 from 195 a month ago.BNP elevated to 2,427, down from 13k 9/12.CXR with bilateral pleural effusions and increased interstitial markings. Hypervolemic on exam given abdominal wall edema and 2+ bilateral pitting edema to the shins, but unable to assess JVD given body habitus. The patient has been hypertensive, but does have cool extremities and pulses are limited by swelling. Will check a lactate to ensure appropriate extremity perfusion. The patient reports good med adherence, low volume fluid intake, low salt diet, and no recent respiratory illness. This may be due to increased afterload secondary to HTN, inadequate torsemide dosing, or worsening of heart function. Will continue with diuresis and consider repeat echocardiogram. -Hold entresto -Coreg 6.25mg  BID -Hydralazine 10mg  TID -IMDUR 30mg  daily -Furosemide 80mg  IV once, reevaluate in the AM -Daily BMP, Mg -Daily weight,Strict I&Os -PT/OT  CKD3b Baseline creatinine 1.6-1.7, found to be 2.5 on admission. Likely pre-renal in the setting of low output heart failure, although BUN:Cr is <20. Will need to watch for ATN and can consider UA for evaluation. Will begin IV diuresis and monitor renal function, expect to improve with better cardiac output. -daily bmp  Hypokalemia Potassium found to be 3.3. Will monitor and replete as needed. -daily bmp,Mg  LLE venous stasis wound Patient is found to have erythema of the LLE with scabbed medial and posterior wounds without wheeping or warmth. She has completed two antibiotic courses for cellulitis of this area as well as abscess drainage within the last 3  months. Do not suspect this is cellulitis given afebrile, no elevated WBC count, no streaking and no warmth. Do suspect this is from chronic edema. May consider DVT rule out given that this is unilateral and the skin is much tenser than the other leg. The patient is neurovascularly intact and do not suspect compartment syndrome. 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 Last followed with neurology 8/23 and was placed on ASA+plavix DAPT for one month, then plavix monotherapy. -clopidogrel 75mg  Daily  T2DM Most recent A1c 03/2022 11.0.Per the SNF [facility] her home insulin regimen is glargine 12u nightly and novolog 12u TID. -Hold  jardiance given GFR of 20 -glargine 15u nightly -novolog 6u TID -SSI  CADHLD -rosuvastatin -F/u lipid panel  HTN -Hold entresto -Hydralazine 10mg  TID -IMDUR 30mg  daily  Full Code  Dispo: Admit patient to Inpatient with expected length of stay greater than 2 midnights.  Signed: Iona Coach, MD 04/18/2022, 4:25 PM  Pager: [phone] After 5pm on weekdays and 1pm on weekends: On Call pager: [phone]

## 2022-04-18 NOTE — Consult Note (Addendum)
WOC Nurse Consult Note: Interim order for conservative wound care placed. [facility] will see patient in the am.  [place] nursing team will not follow, but will remain available to this patient, the nursing and medical teams.  Please re-consult if needed.  Thank you for inviting [place] to participate in this patient's Plan of Care.  Maudie Flakes, MSN, RN, CNS, [place], Serita Grammes, [employer], [employer] phone:  [phone]

## 2022-04-18 NOTE — ED Provider Triage Note (Addendum)
Emergency Medicine Provider Triage Evaluation Note  Alexandra Baker , a 71 y.o. female  was evaluated in triage.  Pt complains of worsening lower extremity swelling, shortness of breath.  Patient was sent in from nursing facility.  Also reported acute kidney injury.  Patient states that she had an admission [facility] 8/23-04/05/22) for left lower extremity cellulitis and CHF.  Denies current fevers.  States that the swelling is getting worse despite taking 40mg  torsemide twice a day.  Review of Systems  Positive: Lower extremity swelling, shortness of breath Negative: Fever  Physical Exam  BP (!) 147/82 (BP Location: Right Arm)   Pulse 70   Temp 97.8 F (36.6 C) (Oral)   Resp 17   SpO2 93%  Gen:   Awake, no distress   Resp:  Normal effort  MSK:   Moves extremities without difficulty  Other:  Pitting edema 2+ bilaterally lower extremities, left lower extremity with induration, no active drainage anterolateral lower leg  Medical Decision Making  Medically screening exam initiated at 10:13 AM.  Appropriate orders placed.  Alexandra Baker was informed that the remainder of the evaluation will be completed by another provider, this initial triage assessment does not replace that evaluation, and the importance of remaining in the ED until their evaluation is complete.     Alexandra Cater, PA-C 04/18/22 1016

## 2022-04-19 ENCOUNTER — Inpatient Hospital Stay (HOSPITAL_COMMUNITY): Payer: Medicare Other

## 2022-04-19 DIAGNOSIS — I130 Hypertensive heart and chronic kidney disease with heart failure and stage 1 through stage 4 chronic kidney disease, or unspecified chronic kidney disease: Secondary | ICD-10-CM | POA: Diagnosis not present

## 2022-04-19 DIAGNOSIS — R609 Edema, unspecified: Secondary | ICD-10-CM | POA: Diagnosis not present

## 2022-04-19 DIAGNOSIS — I5023 Acute on chronic systolic (congestive) heart failure: Secondary | ICD-10-CM | POA: Diagnosis not present

## 2022-04-19 DIAGNOSIS — N1832 Chronic kidney disease, stage 3b: Secondary | ICD-10-CM

## 2022-04-19 LAB — GLUCOSE, CAPILLARY
Glucose-Capillary: 68 mg/dL — ABNORMAL LOW (ref 70–99)
Glucose-Capillary: 113 mg/dL — ABNORMAL HIGH (ref 70–99)
Glucose-Capillary: 136 mg/dL — ABNORMAL HIGH (ref 70–99)
Glucose-Capillary: 166 mg/dL — ABNORMAL HIGH (ref 70–99)
Glucose-Capillary: 131 mg/dL — ABNORMAL HIGH (ref 70–99)

## 2022-04-19 LAB — BASIC METABOLIC PANEL
Anion gap: 14 (ref 5–15)
Anion gap: 12 (ref 5–15)
BUN: 43 mg/dL — ABNORMAL HIGH (ref 8–23)
BUN: 43 mg/dL — ABNORMAL HIGH (ref 8–23)
CO2: 25 mmol/L (ref 22–32)
CO2: 25 mmol/L (ref 22–32)
Calcium: 9.1 mg/dL (ref 8.9–10.3)
Calcium: 9.1 mg/dL (ref 8.9–10.3)
Chloride: 97 mmol/L — ABNORMAL LOW (ref 98–111)
Chloride: 97 mmol/L — ABNORMAL LOW (ref 98–111)
Creatinine, Ser: 2.43 mg/dL — ABNORMAL HIGH (ref 0.44–1.00)
Creatinine, Ser: 2.41 mg/dL — ABNORMAL HIGH (ref 0.44–1.00)
GFR, Estimated: 21 mL/min — ABNORMAL LOW (ref >60)
GFR, Estimated: 21 mL/min — ABNORMAL LOW (ref >60)
Glucose, Bld: 95 mg/dL (ref 70–99)
Glucose, Bld: 186 mg/dL — ABNORMAL HIGH (ref 70–99)
Potassium: 3.2 mmol/L — ABNORMAL LOW (ref 3.5–5.1)
Potassium: 3.7 mmol/L (ref 3.5–5.1)
Sodium: 136 mmol/L (ref 135–145)
Sodium: 134 mmol/L — ABNORMAL LOW (ref 135–145)

## 2022-04-19 LAB — CBC
HCT: 27.9 % — ABNORMAL LOW (ref 36.0–46.0)
Hemoglobin: 9.1 g/dL — ABNORMAL LOW (ref 12.0–15.0)
MCH: 29.9 pg (ref 26.0–34.0)
MCHC: 32.6 g/dL (ref 30.0–36.0)
MCV: 91.8 fL (ref 80.0–100.0)
Platelets: 145 10*3/uL — ABNORMAL LOW (ref 150–400)
RBC: 3.04 MIL/uL — ABNORMAL LOW (ref 3.87–5.11)
RDW: 16.9 % — ABNORMAL HIGH (ref 11.5–15.5)
WBC: 6.9 10*3/uL (ref 4.0–10.5)
nRBC: 0 % (ref 0.0–0.2)

## 2022-04-19 MED ORDER — FUROSEMIDE 10 MG/ML IJ SOLN
80.0000 mg | Freq: Once | INTRAMUSCULAR | Status: AC
  Administered 2022-04-19: 80 mg via INTRAVENOUS
  Filled 2022-04-19: qty 8

## 2022-04-19 MED ORDER — POTASSIUM CHLORIDE CRYS ER 20 MEQ PO TBCR
40.0000 meq | EXTENDED_RELEASE_TABLET | Freq: Two times a day (BID) | ORAL | Status: DC
  Administered 2022-04-19 – 2022-04-21 (×5): 40 meq via ORAL
  Filled 2022-04-19 (×5): qty 2

## 2022-04-19 MED ORDER — MEDIHONEY WOUND/BURN DRESSING EX PSTE
1.0000 | PASTE | Freq: Every day | CUTANEOUS | Status: DC
  Administered 2022-04-19 – 2022-05-03 (×10): 1 via TOPICAL
  Filled 2022-04-19: qty 44

## 2022-04-19 MED ORDER — INSULIN GLARGINE-YFGN 100 UNIT/ML ~~LOC~~ SOLN
12.0000 [IU] | Freq: Every day | SUBCUTANEOUS | Status: DC
  Administered 2022-04-19: 12 [IU] via SUBCUTANEOUS
  Filled 2022-04-19 (×2): qty 0.12

## 2022-04-19 MED ORDER — ISOSORBIDE MONONITRATE ER 30 MG PO TB24
30.0000 mg | ORAL_TABLET | Freq: Every day | ORAL | Status: DC
  Administered 2022-04-20 – 2022-05-03 (×14): 30 mg via ORAL
  Filled 2022-04-19 (×14): qty 1

## 2022-04-19 NOTE — Progress Notes (Signed)
Hypoglycemic Event  CBG: 68  Treatment: 8 oz juice/soda  Symptoms: None  Follow-up CBG: Time:0703 CBG Result:113  Possible Reasons for Event: Inadequate meal intake  Comments/MD notified:no     Alexandra Baker P Tressie Ellis- Yard

## 2022-04-19 NOTE — Progress Notes (Addendum)
Subjective: Patient evaluated at the bedside this AM. Reports orthopnea and SOB overnight. Was able to ambulate well with PT this AM. Denies cough or chestpain. Does endorse some lightheadedness and dizziness. Doesn't know if lower extremity swelling is improving but does think her abdominal swelling is fluid from her heart. She says it may be getting better as it did not weep with her insulin injection this am.  Objective:  Vital signs in last 24 hours: Vitals:   04/19/22 0415 04/19/22 0743 04/19/22 0856 04/19/22 1054  BP: 111/64 (!) 131/59  (!) 122/51  Pulse: 66 (!) 58 63 60  Resp: 18 17  20   Temp: 98.2 F (36.8 C) (!) 97.3 F (36.3 C)    TempSrc: Oral Oral    SpO2: 98% 99%  98%  Weight:      Height:       Gen: well developed, obese, sitting in chair in no acute distress CV: RRR, normal s1/s2, no m/r/g, capillary refill >2 seconds, 2+ bilateral radial pulses Pulm: minimally increased wob on RA, crackles at the bilateral bases Abd: abdominal wall edema Extremity: warm, dry, 2+ pitting edema to the knees, LLE wrapped  Assessment/Plan:  Principal Problem:   Acute exacerbation of congestive heart failure (HCC)  HFmrEF decompensation Patient has persistent 2+ pitting edema on exam, orthopnea and shortness of breath. She is not requiring oxygen at this time. She is remaining hemodynamically stable with normotensive BP today. She is now s/p 80mg  IV lasix x2 and will receive another dose tonight. Will consider need for metolazone if urine output is not improving and if BP and renal function tolerate. Overall difficulty evaluating changes in volume status given inaccurate weights and inaccurate I/Os with incontinence on pure wic. Will also need to consider a cardiorenal syndrome if patients renal function does not improve with further diuresis. -Hold entresto -Hold IMDUR 30mg  daily -Coreg 6.25mg  BID -Hydralazine 10mg  TID -Furosemide 80mg  @ 7pm -Daily BMP, Mg -Daily weight,Strict  I&Os -PT/OT   CKD3b Baseline creatinine 1.6-1.7, found to be 2.5 on admission. Cr now 2.41 following 2x 80mg  IV lasix. Daily weights have been inaccurate between the ED and transition to the floor. The patient has a pure wic in but has had multiple incontinent episodes with inability to measure strict outs. Per documentation she has had 1L net output, 400 of which was following the second dose of lasix. This is not a robust urine output given that her home dose of tosemide is 40mg  BID and this is significantly more lasix. May need additional diuretic such as metolazone as long as BP and renal function can handle this. This may reflect a cardiorenal syndrome or ATN. Can consider UA tomorrow. -daily bmp   Hypokalemia Potassium found to be 3.7. Will monitor and replete as needed. -daily bmp,Mg   LLE venous stasis wound Do not suspect cellulitis given 2 prior courses of antibiotics for this, no warmth, afebrile, normal wbc count. Do not suspect compartment syndrome given normal lactate and neurovascularly intact distally.No DVT on lower extremity ultrasound. Given prior surgery and recent trauma to the leg with edema, erythema, pain could be a component of complex regional pain syndrome. Most likely venous stasis dermatitis that will improve with diuresis. -consider compression wraps -wound consult    CADHLDCVA LDL found to be 37, within goal for secondary prevention. -clopidogrel 75mg  Daily -rosuvastatin   T2DM A1c 9.4 on admission.Per the SNF [facility] her home insulin regimen is glargine 12u nightly and novolog 12u TID. Increased glargine  to 15u overnight and patient hypoglycemic to 68 overnight with improvement to 113 with 8 ounces of juice. Will decrease to home dose of basal insulin. -Hold jardiance given GFR of 20 -glargine 12u nightly -novolog 6u TID -SSI    HTN -Hold entresto -Hydralazine 10mg  TID -IMDUR 30mg  daily  Prior to Admission Living Arrangement: Anticipated Discharge  Location: Barriers to Discharge: Dispo: Anticipated discharge in approximately 2 day(s).   Iona Coach, MD 04/19/2022, 3:13 PM Pager: [phone] After 5pm on weekdays and 1pm on weekends: On Call pager [phone]

## 2022-04-19 NOTE — Consult Note (Signed)
WOC Nurse Consult Note: Reason for Consult:Full thickness left LE wounds Wound type:venous insufficiency, irritant contact dermatitis due to UI.  ICD-10 CM Codes for Irritant Dermatitis  L24A2 - Due to fecal, urinary or dual incontinence  Pressure Injury POA: N/A Measurement:Proximal LLE wound:  1.5cm x 2cm with depth obscured by the presence of yellow fibrinous tissue Distal LLE wound: 3cm x 4cm with depth obscured by the presence of nonviable tissue (soft, yellow eschar) Wound bed:As noted above Drainage (amount, consistency, odor) None Periwound:mild erythema and pitting edema Dressing procedure/placement/frequency: Patient is sitting on the edge of the bed upon my arrival and reports that she sat up most of the night. LEs are edematous, however not weeping. PurWick in place for urinary incontinence. Patient reports that she uses a moisture barrier ointment twice daily to protect her skin from irritant contact dermatitis due to UI and that she skin is currently with mild itch due to UI.  I will provide guidance for the care of the two ulcerations on the LLE using Medihoney as a debriding agent that will support autolytic debridement and provide an antimicrobial environment for wound healing. The underlying issues with venous insufficiency will be addressed with a dry boot: Kerlix and an ACE bandage applied from just below toes to just below knee.   I do not see an ABI on her results review tab, if you agree, please obtain to determine definitive diagnosis of LE wounds as related to venous insufficiency.  Heels are to be floated and a sacral foam placed for PI prevention. A pressure redistribution chair pad is provided for her use both here and downstream.  [place] nursing team will not follow, but will remain available to this patient, the nursing and medical teams.  Please re-consult if needed.  Thank you for inviting [place] to participate in this patient's Plan of Care.  Maudie Flakes, MSN,  RN, CNS, [place], Serita Grammes, [employer], [employer] phone:  [phone]

## 2022-04-19 NOTE — TOC Initial Note (Addendum)
Transition of Care [facility]) - Initial/Assessment Note    Patient Details  Name: Alexandra Baker MRN: [id number] Date of Birth: [birth year]  [facility] CM/SW Contact:    Bethann Berkshire, [place] Phone Number: 04/19/2022, 11:15 AM  Clinical Narrative:                  CSW met with pt to discuss disposition. Pt reports she initially went to [facility] in early/mid August for [place]. This is her 2nd return to the hospital since being at [facility]. Pt states after rehab she was planning on staying with her daughter in [place] but reports she is also considering just going home. Pt wants to return to [facility] to complete her rehab prior to discharging home. She consents to CSW contacting daughter if needed. CSW will complete fl2 and contact [facility].   CSW called [facility] and confirmed pt is from there and can return for [facility] pending insurance auth. Documentation can be faxed [phone]  Expected Discharge Plan: [place] Barriers to Discharge: Continued Medical Work up   Patient Goals and CMS Choice        Expected Discharge Plan and Services Expected Discharge Plan: [place]       Living arrangements for the past 2 months: [place]                                      Prior Living Arrangements/Services Living arrangements for the past 2 months: [place] Lives with:: Facility Resident Patient language and need for interpreter reviewed:: Yes        Need for Family Participation in Patient Care: No (Comment) Care giver support system in place?: Yes (comment)   Criminal Activity/Legal Involvement Pertinent to Current Situation/Hospitalization: No - Comment as needed  Activities of Daily Living Home Assistive Devices/Equipment: Eyeglasses, Wheelchair, Environmental consultant (specify type) ADL Screening (condition at time of admission) Patient's cognitive ability adequate to safely  complete daily activities?: No Is the patient deaf or have difficulty hearing?: No Does the patient have difficulty seeing, even when wearing glasses/contacts?: Yes (cataract) Does the patient have difficulty concentrating, remembering, or making decisions?: No Patient able to express need for assistance with ADLs?: Yes Does the patient have difficulty dressing or bathing?: Yes Independently performs ADLs?: No Communication: Independent Dressing (OT): Needs assistance Is this a change from baseline?: Pre-admission baseline Grooming: Independent with device (comment) Feeding: Independent Bathing: Needs assistance Is this a change from baseline?: Pre-admission baseline Toileting: Needs assistance Is this a change from baseline?: Pre-admission baseline In/Out Bed: Appropriate for developmental age 9 in Home: Needs assistance Is this a change from baseline?: Change from baseline, expected to last <3 days Does the patient have difficulty walking or climbing stairs?: Yes Weakness of Legs: Both Weakness of Arms/Hands: None  Permission Sought/Granted   Permission granted to share information with : Yes, Verbal Permission Granted  Share Information with NAME: tippett,amber (Daughter)   [phone] (Home Phone)           Emotional Assessment Appearance:: Appears stated age Attitude/Demeanor/Rapport: Engaged Affect (typically observed): Accepting Orientation: : Oriented to Self, Oriented to Place, Oriented to  Time, Oriented to Situation Alcohol / Substance Use: Not Applicable Psych Involvement: No (comment)  Admission diagnosis:  Acute exacerbation of congestive heart failure (HCC) [I50.9] AKI (acute kidney injury) ([place]) [N17.9] Acute on chronic congestive heart  failure, unspecified heart failure type [facility]) [I50.9] Patient Active Problem List   Diagnosis Date Noted   Acute exacerbation of congestive heart failure ([place]) 04/18/2022   Colonoscopy refused 11/10/2021   Anemia  06/08/2021   OSA on CPAP 06/08/2021   Dyspnea on exertion 05/14/2021   AKI (acute kidney injury) ([place]) 04/01/2021   Complete tear of right rotator cuff 03/10/2021   Impingement syndrome of right shoulder 03/10/2021   Chronic obstructive pulmonary disease ([place]) 12/30/2020   Severe obesity (BMI 35.0-39.9) with comorbidity ([place]) 12/30/2020   Age-related osteoporosis without current pathological fracture 06/23/2020   Chronic combined systolic and diastolic CHF (congestive heart failure) ([place]) 12/31/2019   Ischemic cardiomyopathy 12/31/2019   CAD (coronary artery disease) of bypass graft 07/18/2019   GAD (generalized anxiety disorder) 07/18/2019   GERD (gastroesophageal reflux disease) 07/18/2019   Hypertension 07/18/2019   Uncontrolled type 2 diabetes mellitus with microalbuminuria, with long-term current use of insulin 07/18/2019   History of coronary artery bypass graft 07/18/2019   Congestive heart failure ([place]) New York Heart Association class III 07/18/2019   Cardiomyopathy ([place]) ejection fraction 4045% in November 2020 07/18/2019   Medically noncompliant 09/12/2018   Heme positive stool 06/20/2018   Tremor 06/20/2018   Memory loss 06/20/2018   Stage 2 chronic kidney disease 02/14/2017   Stage 3b chronic kidney disease ([place]) 02/14/2017   Asthmatic bronchitis without complication 12/75/1700   Benign essential hypertension 05/11/2016   Uncontrolled type 2 diabetes mellitus with hyperglycemia ([place]) 05/11/2016   Hyperlipidemia 05/11/2016   Left leg cellulitis 08/27/2014   PCP:  Alma Friendly, MD Pharmacy:   [place], [place] - [address] [place] [address] Phone: [phone] Fax: [phone]     Social Determinants of Health (SDOH) Interventions    Readmission Risk Interventions     No data to display

## 2022-04-19 NOTE — Evaluation (Signed)
Occupational Therapy Evaluation Patient Details Name: Alexandra Baker MRN: [id number] DOB: 06/28/1951 Today's Date: 04/19/2022   History of Present Illness Patient reports falling three months ago and hurt her leg. Had cellulitis and abscess in leg that got drained and was also treated with antibiotics. Re-hospitalized 8/3-8/14/23 for persistent cellulitis of left lower extremity and found to have slurred speech and reported left facial droop. 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 Pt was admitted to [facility] on 04/18/22 from [facility] presenting with SOB with dx: acute exacerbation CHF. PMH includes but is not limited to: DM2, osteoporosis, cellulitis LLE, coronary disease, GERD, HTN & memory loss.   Clinical Impression   Pt admitted as above presenting with deficits as listed below. Pt is currently Min A for functional mobility related to ADL's/transfers using a RW or rollator. She is Moderate-max assist for LB ADL's at this time. She plans to d/c to SNF for short term rehab followed by returning home with PRN assist. She should benefit from continued acute OT to assist in maximizing independence with ADL's and self care tasks as well as functional mobility and patient education. Will follow acutely for OT.       Recommendations for follow up therapy are one component of a multi-disciplinary discharge planning process, led by the attending physician.  Recommendations may be updated based on patient status, additional functional criteria and insurance authorization.   Follow Up Recommendations  Skilled nursing-short term rehab (<3 hours/day)    Assistance Recommended at Discharge Intermittent Supervision/Assistance  Patient can return home with the following A little help with walking and/or transfers;A little help with bathing/dressing/bathroom;Assistance with cooking/housework;Help with stairs or ramp for entrance    Functional Status Assessment  Patient has  had a recent decline in their functional status and demonstrates the ability to make significant improvements in function in a reasonable and predictable amount of time.  Equipment Recommendations  Other (comment) (Defer to next venue - if pt returns home, rec 3:1)    Recommendations for Other Services       Precautions / Restrictions Precautions Precautions: Fall Restrictions Weight Bearing Restrictions: No      Mobility Bed Mobility Overal bed mobility: Needs Assistance     General bed mobility comments: Up in chair upon OT arrival    Transfers Overall transfer level: Needs assistance Equipment used: Rolling walker (2 wheels), Rollator (4 wheels) Transfers: Sit to/from Stand, Bed to chair/wheelchair/BSC Sit to Stand: Min guard Stand pivot transfers: Min guard       General transfer comment: VC's for safety and sequencing with RW and Rollator      Balance Overall balance assessment: Mild deficits observed, not formally tested, Needs assistance Sitting-balance support: No upper extremity supported, Feet unsupported, Feet supported Sitting balance-Leahy Scale: Good     Standing balance support: No upper extremity supported, During functional activity, Bilateral upper extremity supported, Reliant on assistive device for balance Standing balance-Leahy Scale: Fair Standing balance comment: Pt standing at sink during grooming with trunk forward flexion for UE support in standing at sink.       ADL either performed or assessed with clinical judgement   ADL Overall ADL's : Needs assistance/impaired Eating/Feeding: Set up;Sitting   Grooming: Wash/dry hands;Wash/dry face;Oral care;Standing;Min guard   Upper Body Bathing: Sitting;Min guard;Set up   Lower Body Bathing: Moderate-maximal assistance;Sit to/from stand;Maximal assistance   Upper Body Dressing : Set up;Sitting   Lower Body Dressing: Moderate-maximal assistance;Sit to/from stand;Sitting/lateral leans;Moderate  assistance Lower Body  Dressing Details (indicate cue type and reason): Donning socks, pt reports that she doesn't wear at home - wears slip on shoes Toilet Transfer: Minimal assistance;Cueing for safety;Cueing for sequencing;Rollator (4 wheels);Rolling walker (2 wheels) Toilet Transfer Details (indicate cue type and reason): Simulated - chair to sink and back Toileting- Clothing Manipulation and Hygiene: Minimal assistance;Sitting/lateral lean;Sit to/from stand   Functional mobility during ADLs: Minimal assistance;Rollator (4 wheels);Rolling walker (2 wheels) General ADL Comments: Pt OT assessment as well as education in role of OT and recommendation of return to SNF for short term rehab. Pt would prefer to d/c home if able however. Pt stood at sink for grooming using RW and Min guard-min assist. She then used rollator at end of session - pt may benefit from use of rollator for PRN rest breaks. Will follow acutely,     Vision Baseline Vision/History: 1 Wears glasses;4 Cataracts;6 Macular Degeneration Ability to See in Adequate Light: 1 Impaired Patient Visual Report: No change from baseline        Hand Dominance Right   Extremity/Trunk Assessment Upper Extremity Assessment Upper Extremity Assessment: Overall WFL for tasks assessed   Lower Extremity Assessment Lower Extremity Assessment: Defer to PT evaluation       Communication Communication Communication: No difficulties   Cognition Arousal/Alertness: Awake/alert Behavior During Therapy: WFL for tasks assessed/performed H/o memory loss at baseline per chart review.   General Comments  Pt should benefit from STR at SNF prior to antipicated d/c home with PRN assist. Use rollator during therapy sessions.            Home Living Family/patient expects to be discharged to:: Private residence Living Arrangements: Alone Available Help at Discharge: Family;Available PRN/intermittently (Sister lives a little bit away but can  maybe help some) Type of Home: House Home Access: Stairs to enter Entrance Stairs-Number of Steps: Church to build her a ramp, but 3-5 STE (front 3, back 5) Entrance Stairs-Rails: None Home Layout: Two level;Able to live on main level with bedroom/bathroom     Bathroom Shower/Tub: Occupational psychologist: Standard     Home Equipment: Toilet riser;Shower seat;Grab bars - tub/shower   Additional Comments: Chart review indicates that she is returning to SNF (then home with daughter), Pt states that she wants to return home instead. Wants to be Mod I      Prior Functioning/Environment Prior Level of Function : Independent/Modified Independent     Mobility Comments: Pt was using RW at SNF facility prior to this admission. Chart review indicates that pt was to return to SNF then with daughter in [place] (pt lives in [place], [place]), now pt wants to return home. ADLs Comments: Pt was getting assist for ADL's at SNF rehab        OT Problem List: Decreased knowledge of use of DME or AE;Decreased knowledge of precautions;Obesity;Decreased activity tolerance;Cardiopulmonary status limiting activity;Impaired balance (sitting and/or standing);Pain      OT Treatment/Interventions: Self-care/ADL training;Patient/family education;Energy conservation;Therapeutic activities;DME and/or AE instruction    OT Goals(Current goals can be found in the care plan section) Acute Rehab OT Goals Patient Stated Goal: Go home following SNF rehab OT Goal Formulation: With patient Time For Goal Achievement: 05/03/22 Potential to Achieve Goals: Fair  OT Frequency: Min 2X/week       AM-PAC OT "6 Clicks" Daily Activity     Outcome Measure Help from another person eating meals?: None Help from another person taking care of personal grooming?: A Little Help from another person toileting,  which includes using toliet, bedpan, or urinal?: A Little Help from another person bathing (including washing,  rinsing, drying)?: A Little Help from another person to put on and taking off regular upper body clothing?: None Help from another person to put on and taking off regular lower body clothing?: A Little 6 Click Score: 20   End of Session Equipment Utilized During Treatment: Rolling walker (2 wheels);Rollator (4 wheels) Nurse Communication: Mobility status  Activity Tolerance: Patient tolerated treatment well Patient left: in chair;with call bell/phone within reach;Other (comment) (MD in room)  OT Visit Diagnosis: Other abnormalities of gait and mobility (R26.89);Pain Pain - Right/Left:  (back) Pain - part of body:  (Sciatic nerve pain during ambulation per pt report - pt did not rate)                Time: 3200-3794 OT Time Calculation (min): 15 min Charges:  OT General Charges $OT Visit: 1 Visit OT Evaluation $OT Eval Low Complexity: 1 Low  Paysley Poplar Beth Dixon, OTR/L 04/19/2022, 10:02 AM

## 2022-04-19 NOTE — Evaluation (Signed)
Physical Therapy Evaluation Patient Details Name: Alexandra Baker MRN: [id number] DOB: 10/12/1950 Today's Date: 04/19/2022  History of Present Illness  Patient reports falling three months ago and hurt her leg. Had cellulitis and abscess in leg that got drained and was also treated with antibiotics. Re-hospitalized 8/3-8/14/23 for persistent cellulitis of left lower extremity and found to have slurred speech and reported left facial droop. 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 Pt was admitted to [place] on 04/18/22 from [facility] presenting with SOB with dx: acute exacerbation CHF. PMH includes but is not limited to: DM2, osteoporosis, cellulitis LLE, coronary disease, GERD, HTN & memory loss.  Clinical Impression  Pt admitted with above diagnosis. Pt was able to ambulate with rollator and learned to take rest breaks in sitting.  Educated to pursed lip breathing with pt maintaining sats >90%.  Pt agrees she needs a little more endurance training prior to d/c home and would like to finish her therapy at SNF and then return home with rollator.  Family to build ramp in next 2 weeks per pt. Will follow acutely.  Pt currently with functional limitations due to the deficits listed below (see PT Problem List). Pt will benefit from skilled PT to increase their independence and safety with mobility to allow discharge to the venue listed below.          Recommendations for follow up therapy are one component of a multi-disciplinary discharge planning process, led by the attending physician.  Recommendations may be updated based on patient status, additional functional criteria and insurance authorization.  Follow Up Recommendations Skilled nursing-short term rehab (<3 hours/day) Can patient physically be transported by private vehicle: Yes    Assistance Recommended at Discharge Intermittent Supervision/Assistance  Patient can return home with the following  A little help  with walking and/or transfers;A little help with bathing/dressing/bathroom;Assistance with cooking/housework;Assist for transportation;Help with stairs or ramp for entrance    Equipment Recommendations Rollator (4 wheels)  Recommendations for Other Services       Functional Status Assessment Patient has had a recent decline in their functional status and demonstrates the ability to make significant improvements in function in a reasonable and predictable amount of time.     Precautions / Restrictions Precautions Precautions: Fall Restrictions Weight Bearing Restrictions: No      Mobility  Bed Mobility Overal bed mobility: Needs Assistance             General bed mobility comments: Up in chair upon OT arrival    Transfers Overall transfer level: Needs assistance Equipment used: Rolling walker (2 wheels), Rollator (4 wheels) Transfers: Sit to/from Stand, Bed to chair/wheelchair/BSC Sit to Stand: Min guard Stand pivot transfers: Min guard         General transfer comment: VC's for safety and sequencing with RW and Rollator    Ambulation/Gait Ambulation/Gait assistance: Min guard Gait Distance (Feet): 150 Feet Assistive device: Rollator (4 wheels) Gait Pattern/deviations: Step-through pattern, Decreased stride length, Trunk flexed       General Gait Details: Pt able to learn to use rollator during session. Pt locks and unlocks brakes appropriately. Pt did not need cues or assist. Pt rested x 2 due to fatigue.  Sats maintained >90% on RA.Other VSS.  Stairs            Wheelchair Mobility    Modified Rankin (Stroke Patients Only)       Balance Overall balance assessment: Mild deficits observed, not formally tested, Needs assistance  Sitting-balance support: No upper extremity supported, Feet unsupported, Feet supported Sitting balance-Leahy Scale: Good     Standing balance support: No upper extremity supported, During functional activity, Bilateral  upper extremity supported, Reliant on assistive device for balance Standing balance-Leahy Scale: Fair Standing balance comment: Pt standing at sink during grooming with trunk forward flexion for UE support in standing at sink. OT session in progress on arrival and pt took over and pt walked to hallway with PT.                             Pertinent Vitals/Pain Pain Assessment Pain Assessment: No/denies pain    Home Living Family/patient expects to be discharged to:: Private residence Living Arrangements: Alone Available Help at Discharge: Family;Available PRN/intermittently (Sister lives a little bit away but can maybe help some) Type of Home: House Home Access: Stairs to enter Entrance Stairs-Rails: None Entrance Stairs-Number of Steps: [employer] to build her a ramp, but 3-5 STE (front 3, back 5)   Home Layout: Two level;Able to live on main level with bedroom/bathroom Home Equipment: Toilet riser;Shower seat;Grab bars - tub/shower Additional Comments: Chart review indicates that she is returning to SNF (then home with daughter), Pt states that she wants to return home instead. Wants to be Mod I    Prior Function Prior Level of Function : Independent/Modified Independent             Mobility Comments: Pt was using RW at SNF facility prior to this admission. Chart review indicates that pt was to return to SNF then with daughter in [place] (pt lives in [place], [place]), now pt wants to return home. ADLs Comments: Pt was getting assist for ADL's at SNF rehab     Hand Dominance   Dominant Hand: Right    Extremity/Trunk Assessment   Upper Extremity Assessment Upper Extremity Assessment: Defer to OT evaluation    Lower Extremity Assessment Lower Extremity Assessment: Overall WFL for tasks assessed    Cervical / Trunk Assessment Cervical / Trunk Assessment: Kyphotic  Communication   Communication: No difficulties  Cognition                                                 General Comments General comments (skin integrity, edema, etc.): cued for pursed lip breathing    Exercises General Exercises - Lower Extremity Ankle Circles/Pumps: AROM, Both, 10 reps, Seated Long Arc Quad: AROM, Both, 10 reps, Seated Hip Flexion/Marching: AROM, Both, 10 reps, Seated   Assessment/Plan    PT Assessment Patient needs continued PT services  PT Problem List Decreased activity tolerance;Decreased balance;Decreased mobility;Decreased knowledge of use of DME;Decreased safety awareness;Decreased knowledge of precautions;Cardiopulmonary status limiting activity       PT Treatment Interventions DME instruction;Gait training;Functional mobility training;Therapeutic activities;Therapeutic exercise;Balance training;Patient/family education    PT Goals (Current goals can be found in the Care Plan section)  Acute Rehab PT Goals Patient Stated Goal: to go home PT Goal Formulation: With patient Time For Goal Achievement: 05/03/22 Potential to Achieve Goals: Good    Frequency Min 3X/week     Co-evaluation               AM-PAC PT "6 Clicks" Mobility  Outcome Measure Help needed turning from your back to your side while in a flat bed without using  bedrails?: A Little Help needed moving from lying on your back to sitting on the side of a flat bed without using bedrails?: A Lot Help needed moving to and from a bed to a chair (including a wheelchair)?: A Little Help needed standing up from a chair using your arms (e.g., wheelchair or bedside chair)?: A Little Help needed to walk in hospital room?: A Little Help needed climbing 3-5 steps with a railing? : Total 6 Click Score: 15    End of Session Equipment Utilized During Treatment: Gait belt Activity Tolerance: Patient tolerated treatment well;Patient limited by fatigue Patient left: in chair;with call bell/phone within reach;with chair alarm set Nurse Communication: Mobility status PT  Visit Diagnosis: Muscle weakness (generalized) (M62.81)    Time: 1610-9604 PT Time Calculation (min) (ACUTE ONLY): 21 min   Charges:   PT Evaluation $PT Eval Moderate Complexity: 1 Mod          Early Steel M,PT Acute Rehab Services [phone]   Alvira Philips 04/19/2022, 10:59 AM

## 2022-04-19 NOTE — Progress Notes (Signed)
   04/19/22 1600  Mobility  Activity Transferred to/from [facility]  Level of Assistance Minimal assist, patient does 75% or more  Assistive Device Four wheel walker  Distance Ambulated (ft) 20 ft  Activity Response Tolerated fair  $Mobility charge 1 Mobility   Mobility Specialist Progress Note  Pt was in bed and agreeable. Mobility cut short d/t c/o back pain after getting up to the bathroom. Will follow up as able. Left in bed w/ all needs met and call bell in reach.  Lucious Groves Mobility Specialist

## 2022-04-19 NOTE — Progress Notes (Signed)
LLE venous duplex has been completed.   Results can be found under chart review under CV PROC. 04/19/2022 12:18 PM Pailynn Vahey RVT, RDMS

## 2022-04-20 DIAGNOSIS — N179 Acute kidney failure, unspecified: Secondary | ICD-10-CM

## 2022-04-20 DIAGNOSIS — N1832 Chronic kidney disease, stage 3b: Secondary | ICD-10-CM | POA: Diagnosis not present

## 2022-04-20 DIAGNOSIS — I5023 Acute on chronic systolic (congestive) heart failure: Secondary | ICD-10-CM | POA: Diagnosis not present

## 2022-04-20 LAB — GLUCOSE, CAPILLARY
Glucose-Capillary: 88 mg/dL (ref 70–99)
Glucose-Capillary: 124 mg/dL — ABNORMAL HIGH (ref 70–99)
Glucose-Capillary: 63 mg/dL — ABNORMAL LOW (ref 70–99)
Glucose-Capillary: 82 mg/dL (ref 70–99)
Glucose-Capillary: 98 mg/dL (ref 70–99)

## 2022-04-20 LAB — BASIC METABOLIC PANEL
Anion gap: 12 (ref 5–15)
Anion gap: 7 (ref 5–15)
BUN: 43 mg/dL — ABNORMAL HIGH (ref 8–23)
BUN: 43 mg/dL — ABNORMAL HIGH (ref 8–23)
CO2: 25 mmol/L (ref 22–32)
CO2: 26 mmol/L (ref 22–32)
Calcium: 9.2 mg/dL (ref 8.9–10.3)
Calcium: 8.7 mg/dL — ABNORMAL LOW (ref 8.9–10.3)
Chloride: 99 mmol/L (ref 98–111)
Chloride: 103 mmol/L (ref 98–111)
Creatinine, Ser: 2.34 mg/dL — ABNORMAL HIGH (ref 0.44–1.00)
Creatinine, Ser: 2.35 mg/dL — ABNORMAL HIGH (ref 0.44–1.00)
GFR, Estimated: 22 mL/min — ABNORMAL LOW (ref >60)
GFR, Estimated: 22 mL/min — ABNORMAL LOW (ref >60)
Glucose, Bld: 94 mg/dL (ref 70–99)
Glucose, Bld: 70 mg/dL (ref 70–99)
Potassium: 3.9 mmol/L (ref 3.5–5.1)
Potassium: 4.3 mmol/L (ref 3.5–5.1)
Sodium: 136 mmol/L (ref 135–145)
Sodium: 136 mmol/L (ref 135–145)

## 2022-04-20 MED ORDER — FUROSEMIDE 10 MG/ML IJ SOLN
80.0000 mg | Freq: Once | INTRAMUSCULAR | Status: AC
  Administered 2022-04-20: 80 mg via INTRAVENOUS
  Filled 2022-04-20: qty 8

## 2022-04-20 MED ORDER — INSULIN GLARGINE-YFGN 100 UNIT/ML ~~LOC~~ SOLN
6.0000 [IU] | Freq: Every day | SUBCUTANEOUS | Status: DC
  Administered 2022-04-21: 6 [IU] via SUBCUTANEOUS
  Filled 2022-04-20 (×3): qty 0.06

## 2022-04-20 MED ORDER — DAPAGLIFLOZIN PROPANEDIOL 10 MG PO TABS
10.0000 mg | ORAL_TABLET | Freq: Every day | ORAL | Status: DC
  Administered 2022-04-20 – 2022-05-03 (×14): 10 mg via ORAL
  Filled 2022-04-20 (×14): qty 1

## 2022-04-20 NOTE — Progress Notes (Signed)
   04/20/22 1000  Mobility  Activity Ambulated with assistance in hallway  Level of Assistance Contact guard assist, steadying assist  Assistive Device Four wheel walker  Distance Ambulated (ft) 40 ft  Activity Response Tolerated well  $Mobility charge 1 Mobility   Mobility Specialist Progress Note  Pt was in bed and agreeable. SpO2 dropped to 85% during ambulation but recovered by being coached through pursed lip breathing. Mobility cut short d/t fatigue. Returned to bed w/ all needs met and call bell in reach.   Lucious Groves Mobility Specialist

## 2022-04-20 NOTE — Progress Notes (Addendum)
   Subjective: Patient evaluated at the bedside this AM. Was able to sleep in the bed last night and said orthopnea is slightly improved. Shortness of breath still seems to occur with ambulation but is improving with rest. She has noticed her abdomen is not as swollen and that her thighs are not as tense. Her appetite is decreased at this time. She talks about going back to rehab before staying with her daughter but does say she is sad how long she has been there.  Objective:  Vital signs in last 24 hours: Vitals:   04/20/22 0335 04/20/22 0403 04/20/22 0737 04/20/22 0847  BP: (!) 148/136 (!) 140/73  (!) 142/63  Pulse: 65  68 71  Resp: 20 16 18 16   Temp: 97.6 F (36.4 C)   97.9 F (36.6 C)  TempSrc: Oral   Oral  SpO2: 99%  98% 96%  Weight: 87 kg     Height:      Grossly unchanged from prior day Gen: sitting in bed, no acute distress CV: RRR, no m/r/g, 2+ bilateral radial pulses, cap refill >2 seconds Pulm: normal wob at rest, bibasilar crackles Extremity: warm, dry, 2+ bilateral pitting edema to the knees, LLE wrapped but now warm skin warm to the touch underneath wrapping, distal foot pulses not palpable secondary to edema  Assessment/Plan:  Principal Problem:   Acute exacerbation of congestive heart failure [facility])  Ms. Anthis is a 71 y/o female with a pmh of HFmrEF, CKD3b, COPD, CVA, CAD, HTN, T2DM who presents for decompensated heart failure.  HFmrEF decompensation Continues to have 2+ pitting bilateral LE edema to the knees, bibasilar lung crackles. Does report subjective improvement in orthopnea and has had 1.4kg weight loss with 1.2L net output but multiple undocumented incontinent episodes given difficulty with her pure wic. Given that renal function is improving and BP is normotensive to slightly hypertensive, will continue with IV diuresis and suspect she will require a few more days of this. -Furosemide 80mg  IV AM, re-evaluate for repeat dosing in the PM -Coreg 6.25mg   BID -Hydralazine 10mg  TID -Hold entresto -IMDUR 30mg  daily -Daily BMP, Mg -Daily weight,Strict I&Os -PT/OT   AKI on CKD3b Cardiorenal syndrome Baseline creatinine 1.6-1.7, found to be 2.5 on admission. Cr now 2.34 following 3x 80mg  IV lasix. Do suspect this is in the setting of type 2 cardiorenal syndrome and will continue treating with diuretics. It is unclear if she will get back to baseline or will be found to have progression of her CKD. -daily bmp   Hypokalemia Potassium found to be 3.9. Will monitor and replete as needed. -daily bmp,Mg   LLE venous stasis wound -consider compression wraps -wound consult    CADHLDCVA LDL found to be 37, within goal for secondary prevention. -clopidogrel 75mg  Daily -rosuvastatin   T2DM A1c 9.4 on admission.Per the SNF [facility] her home insulin regimen is glargine 12u nightly and novolog 12u TID. -Hold jardiance given GFR of 20 on admission -glargine 12u nightly -novolog 6u TID -SSI    HTN -Hold entresto -Hydralazine 10mg  TID -IMDUR 30mg  daily  Prior to Admission Living Arrangement: Anticipated Discharge Location: Barriers to Discharge: Dispo: Anticipated discharge in approximately 2 day(s).   Iona Coach, MD 04/20/2022, 10:26 AM Pager: [phone] After 5pm on weekdays and 1pm on weekends: On Call pager [phone]

## 2022-04-21 DIAGNOSIS — I5023 Acute on chronic systolic (congestive) heart failure: Secondary | ICD-10-CM | POA: Diagnosis not present

## 2022-04-21 DIAGNOSIS — N179 Acute kidney failure, unspecified: Secondary | ICD-10-CM | POA: Diagnosis not present

## 2022-04-21 DIAGNOSIS — I130 Hypertensive heart and chronic kidney disease with heart failure and stage 1 through stage 4 chronic kidney disease, or unspecified chronic kidney disease: Secondary | ICD-10-CM | POA: Diagnosis not present

## 2022-04-21 DIAGNOSIS — N1832 Chronic kidney disease, stage 3b: Secondary | ICD-10-CM | POA: Diagnosis not present

## 2022-04-21 LAB — BASIC METABOLIC PANEL
Anion gap: 13 (ref 5–15)
BUN: 41 mg/dL — ABNORMAL HIGH (ref 8–23)
CO2: 23 mmol/L (ref 22–32)
Calcium: 9.2 mg/dL (ref 8.9–10.3)
Chloride: 100 mmol/L (ref 98–111)
Creatinine, Ser: 2.32 mg/dL — ABNORMAL HIGH (ref 0.44–1.00)
GFR, Estimated: 22 mL/min — ABNORMAL LOW (ref >60)
Glucose, Bld: 99 mg/dL (ref 70–99)
Potassium: 3.9 mmol/L (ref 3.5–5.1)
Sodium: 136 mmol/L (ref 135–145)

## 2022-04-21 LAB — GLUCOSE, CAPILLARY
Glucose-Capillary: 100 mg/dL — ABNORMAL HIGH (ref 70–99)
Glucose-Capillary: 160 mg/dL — ABNORMAL HIGH (ref 70–99)
Glucose-Capillary: 173 mg/dL — ABNORMAL HIGH (ref 70–99)
Glucose-Capillary: 178 mg/dL — ABNORMAL HIGH (ref 70–99)

## 2022-04-21 LAB — MAGNESIUM: Magnesium: 1.8 mg/dL (ref 1.7–2.4)

## 2022-04-21 MED ORDER — MAGNESIUM SULFATE 2 GM/50ML IV SOLN
2.0000 g | Freq: Once | INTRAVENOUS | Status: AC
  Administered 2022-04-21: 2 g via INTRAVENOUS
  Filled 2022-04-21: qty 50

## 2022-04-21 MED ORDER — FUROSEMIDE 10 MG/ML IJ SOLN
80.0000 mg | Freq: Once | INTRAMUSCULAR | Status: AC
  Administered 2022-04-21: 80 mg via INTRAVENOUS
  Filled 2022-04-21: qty 8

## 2022-04-21 MED ORDER — FUROSEMIDE 10 MG/ML IJ SOLN
80.0000 mg | Freq: Once | INTRAMUSCULAR | Status: AC
  Administered 2022-04-22: 80 mg via INTRAVENOUS
  Filled 2022-04-21: qty 8

## 2022-04-21 NOTE — Progress Notes (Signed)
Physical Therapy Treatment Patient Details Name: Alexandra Baker MRN: [id number] DOB: 11/24/1950 Today's Date: 04/21/2022   History of Present Illness Patient reports falling three months ago and hurt her leg. Had cellulitis and abscess in leg that got drained and was also treated with antibiotics. Re-hospitalized 8/3-8/14/23 for persistent cellulitis of left lower extremity and found to have slurred speech and reported left facial droop. 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 Pt was admitted to [facility] on 04/18/22 from [facility] presenting with SOB with dx: acute exacerbation CHF. PMH includes but is not limited to: DM2, osteoporosis, cellulitis LLE, coronary disease, GERD, HTN & memory loss.    PT Comments    Pt making steady progress. Plans to return to SNF prior to return home.    Recommendations for follow up therapy are one component of a multi-disciplinary discharge planning process, led by the attending physician.  Recommendations may be updated based on patient status, additional functional criteria and insurance authorization.  Follow Up Recommendations  Skilled nursing-short term rehab (<3 hours/day) Can patient physically be transported by private vehicle: Yes   Assistance Recommended at Discharge Intermittent Supervision/Assistance  Patient can return home with the following A little help with walking and/or transfers;A little help with bathing/dressing/bathroom;Assistance with cooking/housework;Assist for transportation;Help with stairs or ramp for entrance   Equipment Recommendations  Rollator (4 wheels)    Recommendations for Other Services       Precautions / Restrictions Precautions Precautions: Fall Restrictions Weight Bearing Restrictions: No     Mobility  Bed Mobility Overal bed mobility: Needs Assistance Bed Mobility: Sit to Supine       Sit to supine: Min assist   General bed mobility comments: Assist to bring legs up  into bed    Transfers Overall transfer level: Needs assistance Equipment used: Rollator (4 wheels) Transfers: Sit to/from Stand Sit to Stand: Min assist           General transfer comment: Assist to bring hips up from w/c    Ambulation/Gait Ambulation/Gait assistance: Min guard Gait Distance (Feet): 65 Feet (x 2) Assistive device: Rollator (4 wheels) Gait Pattern/deviations: Step-through pattern, Decreased stride length, Trunk flexed Gait velocity: decr Gait velocity interpretation: 1.31 - 2.62 ft/sec, indicative of limited community ambulator   General Gait Details: Assist for safety. 1 seated rest break on rollator.   Stairs             Wheelchair Mobility    Modified Rankin (Stroke Patients Only)       Balance Overall balance assessment: Mild deficits observed, not formally tested, Needs assistance Sitting-balance support: No upper extremity supported, Feet unsupported, Feet supported Sitting balance-Leahy Scale: Good     Standing balance support: No upper extremity supported, During functional activity Standing balance-Leahy Scale: Fair                              Cognition Arousal/Alertness: Awake/alert Behavior During Therapy: WFL for tasks assessed/performed Overall Cognitive Status: History of cognitive impairments - at baseline                                 General Comments: Chart review indicates h/o memory loss at baseline        Exercises      General Comments General comments (skin integrity, edema, etc.): SpO2 98% on RA with amb  Pertinent Vitals/Pain      Home Living                          Prior Function            PT Goals (current goals can now be found in the care plan section) Acute Rehab PT Goals Patient Stated Goal: to go home Progress towards PT goals: Progressing toward goals    Frequency    Min 2X/week      PT Plan Current plan remains appropriate;Frequency  needs to be updated    Co-evaluation              AM-PAC PT "6 Clicks" Mobility   Outcome Measure  Help needed turning from your back to your side while in a flat bed without using bedrails?: A Little Help needed moving from lying on your back to sitting on the side of a flat bed without using bedrails?: A Little Help needed moving to and from a bed to a chair (including a wheelchair)?: A Little Help needed standing up from a chair using your arms (e.g., wheelchair or bedside chair)?: A Little Help needed to walk in hospital room?: A Little Help needed climbing 3-5 steps with a railing? : Total 6 Click Score: 16    End of Session Equipment Utilized During Treatment: Gait belt Activity Tolerance: Patient tolerated treatment well Patient left: with call bell/phone within reach;in bed;with nursing/sitter in room Nurse Communication: Mobility status PT Visit Diagnosis: Muscle weakness (generalized) (M62.81)     Time: 1572-6203 PT Time Calculation (min) (ACUTE ONLY): 28 min  Charges:  $Gait Training: 23-37 mins                     [place] [facility] [place] 04/21/2022, 4:43 PM

## 2022-04-21 NOTE — Progress Notes (Signed)
RT note-Code blue called for patient. Upon entering room patient was awake and had been given Narcan. Patient was ventilated breifly with ambu bag at 100% by RN, now on a [place]. Will continue to monitor.

## 2022-04-21 NOTE — Progress Notes (Addendum)
   Subjective: Patient evaluated at the bedside this AM. She endorsed persistent orthopnea and lightheadedness/dizziness, oxygen desaturation with ambulation. She says there hasnt been much change in her abdominal or LE swelling. She denies chest pain, does have minimal productive cough after nebulizers. She had a new shooting pain in the left shoulder that isnt burning but reminds her of her prior zoster.  Objective:  Vital signs in last 24 hours: Vitals:   04/20/22 2010 04/21/22 0523 04/21/22 0728 04/21/22 0753  BP: 123/78 (!) 140/66 (!) 152/90   Pulse: 66 65 70   Resp: 19  20   Temp: 97.7 F (36.5 C) 97.8 F (36.6 C) (!) 97.4 F (36.3 C)   TempSrc: Oral Oral Oral   SpO2: 97% 97% 99% 98%  Weight:  87 kg    Height:       Gen: laying in bed, no acute distress CV:RRR, no m/r/g, 2+ radial pulses bilaterally Pulm: bibasilar crackles, normal work of breathing on RA Extremity: warm, dry, 2+ bilateral LE pitting edema to the thighs, bilateral LE cool to the touch, pulses not palpable secondary to edema MSK: No rashes, erythema, deformities over the R shoulder,ptp over the right scapula   Assessment/Plan:  Principal Problem:   Acute exacerbation of congestive heart failure [facility])  Alexandra Baker is a 71 y/o female with a pmh of HFmrEF, CKD3b, COPD, CVA, CAD, HTN, T2DM who presents for decompensated heart failure.  HFmrEF decompensation Continues to have orthopnea and dyspnea on exertion, but no oxygen requirements at rest.Volume exam grossly unchanged with persistent bibasilar crackles and 2+ pitting edema to the knees. Patient at 87kg today, same as yesterday. 1L reported urine output yesterday but multiple incontinent episodes and the patient endorses using the bathroom toilet, so suspect good diuresis overall. Given the amount of fluid I do expect it will take a few days to notice dramatic changes of volume status on exam. -Furosemide 80mg  IV AM, re-evaluate for repeat dosing in the  PM -Hold entresto -Coreg 6.25mg  BID -Hydralazine 10mg  TID -IMDUR 30mg  daily -Daily BMP -Daily weight,Strict I&Os -PT/OT   AKI on CKD3b Cardiorenal syndrome Baseline creatinine 1.6-1.7, found to be 2.5 on admission. Cr now 2.32 following 4x 80mg  IV lasix. Will continue to [facility] and evaluate for possible chang ein baseline kidney function. -daily bmp  T2DM A1c 9.4 on admission.Per the SNF [facility] her home insulin regimen is glargine 12u nightly and novolog 12u TID. Blood glucose persistently less than 100 yesterday despite eating so glargine decreased, scheduled mealtime stopped. Blood sugar improving today. -Continue jardiance given GFR of 20 on admission -glargine 6u nightly -SSI  Sinus bradycardiaPVCs Patient with normal HR 60s-70sBPM on vital checks but noted to have sinus bradycardia and irregularity on telemetry. Potassium wnl, will check Mg this PM and also get an EKG.   Hypokalemia  Will monitor and replete as needed. -daily bmp   LLE venous stasis wound -consider compression wraps -wound consult   CADHLDCVA LDL found to be 37, within goal for secondary prevention. -clopidogrel 75mg  Daily -rosuvastatin    HTN -Hold entresto -Hydralazine 10mg  TID -IMDUR 30mg  daily  Prior to Admission Living Arrangement: Anticipated Discharge Location: Barriers to Discharge: Dispo: Anticipated discharge in approximately 2 day(s).   Iona Coach, MD 04/21/2022, 11:30 AM Pager: [phone] After 5pm on weekdays and 1pm on weekends: On Call pager [phone]

## 2022-04-21 NOTE — Progress Notes (Signed)
Occupational Therapy Treatment Patient Details Name: Alexandra Baker MRN: [id number] DOB: February 29, 1951 Today's Date: 04/21/2022   History of present illness Patient reports falling three months ago and hurt her leg. Had cellulitis and abscess in leg that got drained and was also treated with antibiotics. Re-hospitalized 8/3-8/14/23 for persistent cellulitis of left lower extremity and found to have slurred speech and reported left facial droop. 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 Pt was admitted to [facility] on 04/18/22 from [facility] presenting with SOB with dx: acute exacerbation CHF. PMH includes but is not limited to: DM2, osteoporosis, cellulitis LLE, coronary disease, GERD, HTN & memory loss.   OT comments  Pt declined OOB transfers for ADL and functional mobility retraining session today secondary to returning back to bed after using bathroom earlier. Pt was agreeable to education re: energy conservation techniques. Handout was isssued and reviewed with pt, also discussed pursed lip breathing techniques to assist in maintaining SpO2 in 90's. Emphasized taking rest breaks PRN and recommended continuing to use rollator w/ supervision/assist. Pt was left in bed with call bell within reach. Pt plans to d/c to SNF rehab when medically able.   Recommendations for follow up therapy are one component of a multi-disciplinary discharge planning process, led by the attending physician.  Recommendations may be updated based on patient status, additional functional criteria and insurance authorization.    Follow Up Recommendations  Skilled nursing-short term rehab (<3 hours/day)    Assistance Recommended at Discharge Intermittent Supervision/Assistance  Patient can return home with the following  A little help with walking and/or transfers;A little help with bathing/dressing/bathroom;Assistance with cooking/housework;Help with stairs or ramp for entrance   Equipment  Recommendations  Other (comment) (Defer to next venue)    Recommendations for Other Services      Precautions / Restrictions Precautions Precautions: Fall Restrictions Weight Bearing Restrictions: No       Mobility Bed Mobility   General bed mobility comments: In bed upon OT arrival - pt declined OOB stating that she had used bathroom earlier, also declined grooming standing at sink.    Transfers   General transfer comment: Pt declined OOB at this time     Balance  N/A   ADL either performed or assessed with clinical judgement   ADL Overall ADL's : Needs assistance/impaired   General ADL Comments: Pt declined OOB transfers for ADL retraining session today secondary to returning back to bed after using bathroom earlier. Pt was agreeable to education re: energy conservation techniques. Handout was isssued and reviewed with pt, also discussed pursed lip breathing techniques to assist in maintaining SpO2 in 90's. Emphasized taking rest breaks PRN and recommended continuing to use rollator w/ supervision/assist. Pt was left in bed with call bell within reach.    Extremity/Trunk Assessment Upper Extremity Assessment Upper Extremity Assessment: Overall WFL for tasks assessed   Lower Extremity Assessment Lower Extremity Assessment: Overall WFL for tasks assessed;Defer to PT evaluation   Cervical / Trunk Assessment Cervical / Trunk Assessment: Kyphotic    Vision Baseline Vision/History: 1 Wears glasses;4 Cataracts;6 Macular Degeneration Ability to See in Adequate Light: 1 Impaired Patient Visual Report: No change from baseline            Cognition Arousal/Alertness: Awake/alert Behavior During Therapy: WFL for tasks assessed/performed Overall Cognitive Status: Within Functional Limits for tasks assessed     General Comments: Chart review indicates h/o memory loss at baseline  Pertinent Vitals/ Pain       Pain Assessment Pain Assessment:  No/denies pain  Home Living  From SNF, plans to return to SNF prior to home to her house vs home with daughter.    Prior Functioning/Environment   From SNF   Frequency  Min 2X/week        Progress Toward Goals  OT Goals(current goals can now be found in the care plan section)  Progress towards OT goals: Progressing toward goals  Acute Rehab OT Goals Patient Stated Goal: SNF then home OT Goal Formulation: With patient Time For Goal Achievement: 05/03/22 Potential to Achieve Goals: [place] Discharge plan remains appropriate    AM-PAC OT "6 Clicks" Daily Activity     Outcome Measure   Help from another person eating meals?: None Help from another person taking care of personal grooming?: A Little Help from another person toileting, which includes using toliet, bedpan, or urinal?: A Little Help from another person bathing (including washing, rinsing, drying)?: A Little Help from another person to put on and taking off regular upper body clothing?: None Help from another person to put on and taking off regular lower body clothing?: A Little 6 Click Score: 20    End of Session    OT Visit Diagnosis: Other abnormalities of gait and mobility (R26.89);Pain   Activity Tolerance Other (comment) (Pt declined OOB for functional mobility &/of ADL's but was agreeable to education re: energy conservation techniques.)   Patient Left in bed;with call bell/phone within reach   Nurse Communication Other (comment) (Declined OOB ADL/functional mobility, agreeable to education GG:YIRSWN conservation tech. Handout issued and reviewed)        Time: 1020-1036 OT Time Calculation (min): 16 min  Charges: OT General Charges $OT Visit: 1 Visit OT Treatments $Self Care/Home Management : 8-22 mins   Cassadie Pankonin Beth Dixon, OTR/L 04/21/2022, 10:49 AM

## 2022-04-21 NOTE — Care Management Important Message (Signed)
Important Message  Patient Details  Name: [name] MRN: [id number] Date of Birth: [birth year]   Medicare Important Message Given:  Yes     Shelda Altes 04/21/2022, 10:46 AM

## 2022-04-22 DIAGNOSIS — N1832 Chronic kidney disease, stage 3b: Secondary | ICD-10-CM | POA: Diagnosis not present

## 2022-04-22 DIAGNOSIS — I130 Hypertensive heart and chronic kidney disease with heart failure and stage 1 through stage 4 chronic kidney disease, or unspecified chronic kidney disease: Secondary | ICD-10-CM | POA: Diagnosis not present

## 2022-04-22 DIAGNOSIS — N179 Acute kidney failure, unspecified: Secondary | ICD-10-CM | POA: Diagnosis not present

## 2022-04-22 DIAGNOSIS — I5023 Acute on chronic systolic (congestive) heart failure: Secondary | ICD-10-CM | POA: Diagnosis not present

## 2022-04-22 LAB — BASIC METABOLIC PANEL
Anion gap: 12 (ref 5–15)
BUN: 43 mg/dL — ABNORMAL HIGH (ref 8–23)
CO2: 25 mmol/L (ref 22–32)
Calcium: 9.2 mg/dL (ref 8.9–10.3)
Chloride: 98 mmol/L (ref 98–111)
Creatinine, Ser: 2.26 mg/dL — ABNORMAL HIGH (ref 0.44–1.00)
GFR, Estimated: 23 mL/min — ABNORMAL LOW (ref >60)
Glucose, Bld: 173 mg/dL — ABNORMAL HIGH (ref 70–99)
Potassium: 4.1 mmol/L (ref 3.5–5.1)
Sodium: 135 mmol/L (ref 135–145)

## 2022-04-22 LAB — GLUCOSE, CAPILLARY
Glucose-Capillary: 147 mg/dL — ABNORMAL HIGH (ref 70–99)
Glucose-Capillary: 197 mg/dL — ABNORMAL HIGH (ref 70–99)
Glucose-Capillary: 217 mg/dL — ABNORMAL HIGH (ref 70–99)
Glucose-Capillary: 182 mg/dL — ABNORMAL HIGH (ref 70–99)

## 2022-04-22 MED ORDER — FUROSEMIDE 10 MG/ML IJ SOLN
80.0000 mg | Freq: Once | INTRAMUSCULAR | Status: AC
  Administered 2022-04-22: 80 mg via INTRAVENOUS
  Filled 2022-04-22: qty 8

## 2022-04-22 MED ORDER — FUROSEMIDE 10 MG/ML IJ SOLN
80.0000 mg | Freq: Once | INTRAMUSCULAR | Status: AC
  Administered 2022-04-23: 80 mg via INTRAVENOUS
  Filled 2022-04-22: qty 8

## 2022-04-22 MED ORDER — INSULIN GLARGINE-YFGN 100 UNIT/ML ~~LOC~~ SOLN
10.0000 [IU] | Freq: Every day | SUBCUTANEOUS | Status: DC
  Administered 2022-04-22 – 2022-04-23 (×2): 10 [IU] via SUBCUTANEOUS
  Filled 2022-04-22 (×4): qty 0.1

## 2022-04-22 NOTE — Progress Notes (Signed)
   04/22/22 1001  Mobility  Activity Ambulated with assistance in hallway  Level of Assistance Contact guard assist, steadying assist  Assistive Device Front wheel walker  Distance Ambulated (ft) 26 ft  Activity Response Tolerated fair  $Mobility charge 1 Mobility   Mobility Specialist Progress Note  Pt was in bed and agreeable. Mobility cut short d/t c/o back pain. Returned to bed w/ all needs met and call bell in reach.   Lucious Groves Mobility Specialist

## 2022-04-22 NOTE — TOC Progression Note (Signed)
Transition of Care [facility]) - Progression Note    Patient Details  Name: Alexandra Baker MRN: [id number] Date of Birth: [birth year]  [facility] CM/SW [place], [place] Phone Number: 04/22/2022, 2:25 PM  Clinical Narrative:     TOC continuing to follow. Will initiate insurance auth when closer to DC date.    Expected Discharge Plan: [place] Barriers to Discharge: Continued Medical Work up  Expected Discharge Plan and Services Expected Discharge Plan: [place] arrangements for the past 2 months: [place]                                       Social Determinants of Health (SDOH) Interventions    Readmission Risk Interventions     No data to display

## 2022-04-22 NOTE — Progress Notes (Addendum)
   Subjective: Patient evaluated at the bedside this AM. She feels fine from a breathing standpoint at rest but is still having orthopnea and dyspnea with walking. She also feels her legs and back give out with walking. Still having lightheadedness and dizziness when first standing. Denies CP.  Objective:  Vital signs in last 24 hours: Vitals:   04/21/22 1900 04/21/22 2046 04/22/22 0410 04/22/22 0733  BP:  (!) 140/70 (!) 151/68   Pulse: 60     Resp: 20 20    Temp:  97.7 F (36.5 C) 98.2 F (36.8 C)   TempSrc:  Oral Oral   SpO2: 96% 99% 100% 99%  Weight:   86.2 kg   Height:       DZH:GDJME patient laying in bed with no acute distress CV:RRR, no murmur/r/g, 2+bilateral radial pulses, capillary refill <2 seconds Pulm: patient laying left lateral decubitus with crackles at the bilateral bases and the left upper lobe, mildly increased work of breathing on RA Abd: soft, NT, 2+ abdominal wall pitting edema Extremity:warm, dry, 2+ pitting edema to the thighs, LLE wound care wrapping    Assessment/Plan:  Principal Problem:   Acute exacerbation of congestive heart failure [facility])  Ms. Schier is a 71 y/o female with a pmh of HFmrEF, CKD3b, COPD, CVA, CAD, HTN, T2DM who presents for decompensated heart failure.  HFmrEF decompensation Patient continues to have exertional dyspnea and orthopnea. Weight now 86.2 kg from 88.4. I/Os continue to be inaccurate due to incontinence but subjectively patient is diuresing well per her increased urinary frequency. Kidney function improving with decongestion. Will more aggressively diurese today given stable renal function. -Furosemide 80mg  IV TID -Coreg 6.25mg  BID -Hydralazine 10mg  TID -IMDUR 30mg  daily -Hold entresto -Daily BMP -Daily weight,Strict I&Os -PT/OT   AKI on CKD3b Cardiorenal syndrome Baseline creatinine 1.6-1.7, found to be 2.5 on admission. Cr now 2.26. Most recent magnesium and potassium wnl. -daily bmp  T2DM A1c 9.4 on  admission.Per the SNF [facility] her home insulin regimen is glargine 12u nightly and novolog 12u TID. Fasting BG this AM 147. -Continue jardiance -glargine 6u nightly -SSI   LLE venous stasis wound -consider compression wraps -wound consult   CADHLDCVA LDL found to be 37, within goal for secondary prevention. -clopidogrel 75mg  Daily -rosuvastatin    HTN -Hold entresto -Hydralazine 10mg  TID -IMDUR 30mg  daily  Prior to Admission Living Arrangement: Anticipated Discharge Location: Barriers to Discharge: Dispo: Anticipated discharge in approximately 2 day(s).   Iona Coach, MD 04/22/2022, 10:37 AM Pager: [phone] After 5pm on weekdays and 1pm on weekends: On Call pager [phone]

## 2022-04-23 DIAGNOSIS — I5023 Acute on chronic systolic (congestive) heart failure: Secondary | ICD-10-CM | POA: Diagnosis not present

## 2022-04-23 DIAGNOSIS — N179 Acute kidney failure, unspecified: Secondary | ICD-10-CM | POA: Diagnosis not present

## 2022-04-23 DIAGNOSIS — N1832 Chronic kidney disease, stage 3b: Secondary | ICD-10-CM | POA: Diagnosis not present

## 2022-04-23 DIAGNOSIS — I130 Hypertensive heart and chronic kidney disease with heart failure and stage 1 through stage 4 chronic kidney disease, or unspecified chronic kidney disease: Secondary | ICD-10-CM | POA: Diagnosis not present

## 2022-04-23 LAB — BASIC METABOLIC PANEL
Anion gap: 13 (ref 5–15)
Anion gap: 10 (ref 5–15)
BUN: 42 mg/dL — ABNORMAL HIGH (ref 8–23)
BUN: 43 mg/dL — ABNORMAL HIGH (ref 8–23)
CO2: 25 mmol/L (ref 22–32)
CO2: 27 mmol/L (ref 22–32)
Calcium: 9.5 mg/dL (ref 8.9–10.3)
Calcium: 9.1 mg/dL (ref 8.9–10.3)
Chloride: 97 mmol/L — ABNORMAL LOW (ref 98–111)
Chloride: 99 mmol/L (ref 98–111)
Creatinine, Ser: 2.35 mg/dL — ABNORMAL HIGH (ref 0.44–1.00)
Creatinine, Ser: 2.41 mg/dL — ABNORMAL HIGH (ref 0.44–1.00)
GFR, Estimated: 22 mL/min — ABNORMAL LOW (ref >60)
GFR, Estimated: 21 mL/min — ABNORMAL LOW (ref >60)
Glucose, Bld: 154 mg/dL — ABNORMAL HIGH (ref 70–99)
Glucose, Bld: 226 mg/dL — ABNORMAL HIGH (ref 70–99)
Potassium: 3.3 mmol/L — ABNORMAL LOW (ref 3.5–5.1)
Potassium: 3.7 mmol/L (ref 3.5–5.1)
Sodium: 135 mmol/L (ref 135–145)
Sodium: 136 mmol/L (ref 135–145)

## 2022-04-23 LAB — GLUCOSE, CAPILLARY
Glucose-Capillary: 147 mg/dL — ABNORMAL HIGH (ref 70–99)
Glucose-Capillary: 215 mg/dL — ABNORMAL HIGH (ref 70–99)
Glucose-Capillary: 235 mg/dL — ABNORMAL HIGH (ref 70–99)
Glucose-Capillary: 189 mg/dL — ABNORMAL HIGH (ref 70–99)

## 2022-04-23 LAB — MAGNESIUM: Magnesium: 2 mg/dL (ref 1.7–2.4)

## 2022-04-23 MED ORDER — POTASSIUM CHLORIDE 20 MEQ PO PACK
40.0000 meq | PACK | Freq: Two times a day (BID) | ORAL | Status: DC
  Administered 2022-04-23: 40 meq via ORAL
  Filled 2022-04-23: qty 2

## 2022-04-23 MED ORDER — METOLAZONE 2.5 MG PO TABS
2.5000 mg | ORAL_TABLET | Freq: Once | ORAL | Status: AC
  Administered 2022-04-23: 2.5 mg via ORAL
  Filled 2022-04-23: qty 1

## 2022-04-23 MED ORDER — POTASSIUM CHLORIDE CRYS ER 20 MEQ PO TBCR
40.0000 meq | EXTENDED_RELEASE_TABLET | Freq: Once | ORAL | Status: AC
  Administered 2022-04-23: 40 meq via ORAL
  Filled 2022-04-23: qty 2

## 2022-04-23 MED ORDER — FUROSEMIDE 10 MG/ML IJ SOLN
80.0000 mg | Freq: Once | INTRAMUSCULAR | Status: AC
  Administered 2022-04-23: 80 mg via INTRAVENOUS
  Filled 2022-04-23: qty 8

## 2022-04-23 NOTE — Progress Notes (Signed)
   04/23/22 1000  Mobility  Activity Ambulated with assistance in hallway  Level of Assistance Contact guard assist, steadying assist  Assistive Device Front wheel walker  Distance Ambulated (ft) 50 ft  Activity Response Tolerated well  $Mobility charge 1 Mobility   Mobility Specialist Progress Note  Pt was in bed and agreeable. X1 seated break d/t back pain. Returned to bed w/ all needs met and call bell in reach.    Lucious Groves Mobility Specialist

## 2022-04-23 NOTE — Progress Notes (Signed)
Notified Dr. Stann Mainland at [phone] that patient is having runs of vtach, asymptomatic. Denies chest pain, denies shortness of breath with current BP 129/54. MD ordered BMET and mag level to be drawn and stated to continue to watch. No further orders at this time.

## 2022-04-23 NOTE — Progress Notes (Signed)
   Subjective: Patient evaluated at the bedside this AM.She says she is having some sciatic nerve pain but is otherwise feeling okay. She says her orthopnea and dyspnea with exertion are improving. She does not feel short of breath on 2L [place] at rest. She denies dizziness or lightheadedness today. She also denies CP. She feels her abdominal swelling and leg swelling are improving.  Objective:  Vital signs in last 24 hours: Vitals:   04/23/22 0321 04/23/22 0831 04/23/22 1111 04/23/22 1144  BP: 132/73 134/72 128/66   Pulse: (!) 59 63 (!) 59   Resp: 18 14 17 15   Temp: 97.8 F (36.6 C) (!) 97.5 F (36.4 C) 98 F (36.7 C)   TempSrc: Oral Oral Oral   SpO2: 98% 94% 97%   Weight: 86.4 kg     Height:       VFI:EPPI, laying in bed, no acute distress CV:RRR, no m/r/g, normal s1/s2 Pulm: mildly elevated wob on 2L [place], bibasilar crackles, good airflow Abd: normoactive bowel sounds, soft, NT, interval resolution of abdominal wall pitting edema Extremity:warm, dry, 2+ bilateral pitting edema to the thighs  Assessment/Plan:  Principal Problem:   Acute exacerbation of congestive heart failure [facility])  Alexandra Baker is a 71 y/o female with a pmh of HFmrEF, CKD3b, COPD, CVA, CAD, HTN, T2DM who presents for decompensated heart failure.  HFmrEF decompensation Improving orthopnea and exertional dyspnea. Interval weight gain from 86.2 to 86.4kg. Strict I & O is still difficult to accurately measure as patient is urinating in toilet when having a BM and having leaking of her pur wic. Will decrease lasix to BID and add metolazone today as her GFR is stable from 23 to 22 this AM. -Furosemide 80mg  IV BID -Metolazone 2.5 mg PO -Coreg 6.25mg  BID -Hydralazine 10mg  TID -IMDUR 30mg  daily -Hold entresto -Daily BMP -Daily weight,Strict I&Os -PT/OT   AKI on CKD3b Cardiorenal syndrome Baseline creatinine 1.6-1.7, found to be 2.5 on admission. Cr now 2.35. Potassium low at 3.3 and repleted. Will continue  monitoring electrolytes. -daily bmp -Mg AM  T2DM A1c 9.4 on admission.Per the SNF [facility] her home insulin regimen is glargine 12u nightly and novolog 12u TID. Fasting BG this AM 147. BG at lunch was 215, will evaluate for need to add scheduled mealtime coverage tomorrow. -Continue jardiance -glargine 6u nightly -SSI   LLE venous stasis wound -consider compression wraps -wound consult   CADHLDCVA LDL found to be 37, within goal for secondary prevention. -clopidogrel 75mg  Daily -rosuvastatin    HTN -Hold entresto -Hydralazine 10mg  TID -IMDUR 30mg  daily  Prior to Admission Living Arrangement: Anticipated Discharge Location: Barriers to Discharge: Dispo: Anticipated discharge in approximately 2 day(s).   Alexandra Coach, MD 04/23/2022, 11:54 AM Pager: [phone] After 5pm on weekdays and 1pm on weekends: On Call pager [phone]

## 2022-04-24 LAB — GLUCOSE, CAPILLARY
Glucose-Capillary: 182 mg/dL — ABNORMAL HIGH (ref 70–99)
Glucose-Capillary: 207 mg/dL — ABNORMAL HIGH (ref 70–99)
Glucose-Capillary: 188 mg/dL — ABNORMAL HIGH (ref 70–99)
Glucose-Capillary: 195 mg/dL — ABNORMAL HIGH (ref 70–99)

## 2022-04-24 LAB — BASIC METABOLIC PANEL
Anion gap: 12 (ref 5–15)
BUN: 41 mg/dL — ABNORMAL HIGH (ref 8–23)
CO2: 25 mmol/L (ref 22–32)
Calcium: 9.2 mg/dL (ref 8.9–10.3)
Chloride: 98 mmol/L (ref 98–111)
Creatinine, Ser: 2.36 mg/dL — ABNORMAL HIGH (ref 0.44–1.00)
GFR, Estimated: 22 mL/min — ABNORMAL LOW (ref >60)
Glucose, Bld: 183 mg/dL — ABNORMAL HIGH (ref 70–99)
Potassium: 3.7 mmol/L (ref 3.5–5.1)
Sodium: 135 mmol/L (ref 135–145)

## 2022-04-24 MED ORDER — FUROSEMIDE 10 MG/ML IJ SOLN
80.0000 mg | Freq: Three times a day (TID) | INTRAMUSCULAR | Status: DC
  Administered 2022-04-24 (×3): 80 mg via INTRAVENOUS
  Filled 2022-04-24 (×4): qty 8

## 2022-04-24 MED ORDER — INSULIN GLARGINE-YFGN 100 UNIT/ML ~~LOC~~ SOLN
13.0000 [IU] | Freq: Every day | SUBCUTANEOUS | Status: DC
  Administered 2022-04-24: 13 [IU] via SUBCUTANEOUS
  Filled 2022-04-24 (×2): qty 0.13

## 2022-04-24 MED ORDER — ACETAZOLAMIDE 250 MG PO TABS
500.0000 mg | ORAL_TABLET | Freq: Once | ORAL | Status: AC
  Administered 2022-04-24: 500 mg via ORAL
  Filled 2022-04-24: qty 2

## 2022-04-24 MED ORDER — HYDROCODONE-ACETAMINOPHEN 5-325 MG PO TABS
1.0000 | ORAL_TABLET | Freq: Two times a day (BID) | ORAL | Status: AC | PRN
  Administered 2022-04-24 – 2022-04-25 (×2): 1 via ORAL
  Filled 2022-04-24 (×3): qty 1

## 2022-04-24 MED ORDER — FUROSEMIDE 10 MG/ML IJ SOLN: 80.0000 mg | Freq: Two times a day (BID) | INTRAMUSCULAR | Status: DC

## 2022-04-24 MED ORDER — METOLAZONE 2.5 MG PO TABS: 2.5000 mg | ORAL_TABLET | Freq: Once | ORAL | Status: DC

## 2022-04-24 NOTE — Progress Notes (Signed)
   04/24/22 1018  Mobility  Activity Ambulated with assistance in hallway  Level of Assistance Standby assist, set-up cues, supervision of patient - no hands on  Assistive Device Four wheel walker  Distance Ambulated (ft) 50 ft  Activity Response Tolerated well  $Mobility charge 1 Mobility   Mobility Specialist Progress Note  Pre-Mobility: HR, BP,  95% SpO2 During Mobility: HR, BP, 93% SpO2 (RA)  Pt was in bed and agreeable. X1 seated break d/t c/o back pain. Returned to bed w/ all needs met and call bell in reach.   Lucious Groves Mobility Specialist

## 2022-04-24 NOTE — Plan of Care (Signed)
  Problem: Nutrition: Goal: Adequate nutrition will be maintained Outcome: Completed/Met   Problem: Elimination: Goal: Will not experience complications related to bowel motility Outcome: Completed/Met Goal: Will not experience complications related to urinary retention Outcome: Completed/Met   Problem: Pain Managment: Goal: General experience of comfort will improve Outcome: Completed/Met   Problem: Safety: Goal: Ability to remain free from injury will improve Outcome: Completed/Met   Problem: Nutritional: Goal: Maintenance of adequate nutrition will improve Outcome: Completed/Met

## 2022-04-24 NOTE — Progress Notes (Signed)
HD#6 Subjective:  Overnight Events: NA  Patient is doing better. Patient thinks that the limiting factor is the swelling on the feet. Does not feel suffocated when laying down. Patient is still requiring 2L [place] at night. Patient has been using bedside commode and purewick for urination.  She still have issue with insomnia and is taking melatonin.  Objective:  Vital signs in last 24 hours: Vitals:   04/23/22 2029 04/24/22 0300 04/24/22 0407 04/24/22 0902  BP: 129/63  (!) 142/78 131/62  Pulse: (!) 58  60 (!) 59  Resp: 20  20 14   Temp: 97.7 F (36.5 C)  97.9 F (36.6 C) (!) 97.4 F (36.3 C)  TempSrc: Oral  Oral Oral  SpO2: 100%  98% 98%  Weight:  85.7 kg    Height:       Supplemental O2: Nasal Cannula SpO2: 98 % O2 Flow Rate (L/min): 2 L/min   Physical Exam:  Physical Exam Constitutional:      General: She is not in acute distress. HENT:     Head: Normocephalic.  Eyes:     General:        Right eye: No discharge.        Left eye: No discharge.     Conjunctiva/sclera: Conjunctivae normal.  Cardiovascular:     Rate and Rhythm: Normal rate and regular rhythm.     Heart sounds: Normal heart sounds.     Comments: +3 bilateral LE edema Pulmonary:     Comments: Crackles bilateral lung bases Abdominal:     General: Bowel sounds are normal. There is no distension.     Palpations: Abdomen is soft.  Musculoskeletal:     Cervical back: Normal range of motion.     Right lower leg: No edema.     Left lower leg: No edema.  Skin:    General: Skin is warm.  Neurological:     General: No focal deficit present.     Mental Status: She is alert.  Psychiatric:        Mood and Affect: Mood normal.     Filed Weights   04/22/22 0410 04/23/22 0321 04/24/22 0300  Weight: 86.2 kg 86.4 kg 85.7 kg     Intake/Output Summary (Last 24 hours) at 04/24/2022 0917 Last data filed at 04/24/2022 0916 Gross per 24 hour  Intake 660 ml  Output 1671 ml  Net -1011 ml   Net IO Since  Admission: -2,271 mL [04/24/22 0917]  Pertinent Labs:    Latest Ref Rng & Units 04/19/2022    3:36 AM 04/18/2022   10:17 AM 04/12/2022    2:40 PM  CBC  WBC 4.0 - 10.5 K/uL 6.9  7.2  7.3   Hemoglobin 12.0 - 15.0 g/dL 9.1  9.8  9.4   Hematocrit 36.0 - 46.0 % 27.9  30.2  29.1   Platelets 150 - 400 K/uL 145  165  240        Latest Ref Rng & Units 04/24/2022    5:53 AM 04/23/2022    6:52 PM 04/23/2022    3:58 AM  CMP  Glucose 70 - 99 mg/dL 183  226  154   BUN 8 - 23 mg/dL 41  43  42   Creatinine 0.44 - 1.00 mg/dL 2.36  2.41  2.35   Sodium 135 - 145 mmol/L 135  136  135   Potassium 3.5 - 5.1 mmol/L 3.7  3.7  3.3   Chloride 98 - 111 mmol/L  98  99  97   CO2 22 - 32 mmol/L 25  27  25    Calcium 8.9 - 10.3 mg/dL 9.2  9.1  9.5     Imaging: No results found.  Assessment/Plan:   Principal Problem:   Acute exacerbation of congestive heart failure (HCC) Active Problems:   Benign essential hypertension   CAD (coronary artery disease) of bypass graft   Uncontrolled type 2 diabetes mellitus with hyperglycemia (HCC)   OSA on CPAP   Patient Summary: Ms. Weightman is a 71 y/o female with a pmh of HFmrEF, CKD3b, COPD, CVA, CAD, HTN, T2DM who presents for decompensated heart failure.   HFmrEF decompensation Put out 1500 cc of output last night with 1 pound weight dropped.  Suspect higher output due to leakage.  Patient still significantly volume overloaded.  We will increase Lasix to 80 mg 3 times daily and added acetazolamide.  Kidney function stable. -Furosemide 80mg  IV TID + acetazolamide -Coreg 6.25mg  BID -Added Unna boots for LE edema -Hydralazine 10mg  TID -IMDUR 30mg  daily -Hold entresto -Daily BMP -Daily weight,Strict I&Os  AKI on CKD3b Cardiorenal syndrome Creatinine has been stable in the last week.  This may be her new baseline.  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 -We will hold Entresto for now but will likely to resume soon   T2DM A1c 9.4 on admission. Per the SNF  [facility] her home insulin regimen is glargine 12u nightly and novolog 12u TID.  -Continue jardiance -Increase Semglee to 13 units daily -SSI   LLE venous stasis wound -consider compression wraps -wound consult   CADHLDCVA LDL found to be 37, within goal for secondary prevention. -clopidogrel 75mg  Daily -rosuvastatin    HTN -Hold entresto -Hydralazine 10mg  TID -IMDUR 30mg  daily  Diet: Carb-Modified IVF: None,None VTE: Enoxaparin Code: Full PT/OT recs: SNF for Subacute PT, none. TOC recs:   Dispo: Anticipated discharge to Skilled nursing facility in 3 days pending diuresis goal for.   Gaylan Gerold, DO 04/24/2022, 9:17 AM Pager: [phone]  Please contact the on call pager after 5 pm and on weekends at [phone].

## 2022-04-25 DIAGNOSIS — I5020 Unspecified systolic (congestive) heart failure: Secondary | ICD-10-CM | POA: Diagnosis not present

## 2022-04-25 DIAGNOSIS — I130 Hypertensive heart and chronic kidney disease with heart failure and stage 1 through stage 4 chronic kidney disease, or unspecified chronic kidney disease: Secondary | ICD-10-CM | POA: Diagnosis not present

## 2022-04-25 DIAGNOSIS — N179 Acute kidney failure, unspecified: Secondary | ICD-10-CM | POA: Diagnosis not present

## 2022-04-25 DIAGNOSIS — E1165 Type 2 diabetes mellitus with hyperglycemia: Secondary | ICD-10-CM

## 2022-04-25 DIAGNOSIS — N1832 Chronic kidney disease, stage 3b: Secondary | ICD-10-CM | POA: Diagnosis not present

## 2022-04-25 DIAGNOSIS — Z794 Long term (current) use of insulin: Secondary | ICD-10-CM

## 2022-04-25 LAB — BASIC METABOLIC PANEL
Anion gap: 11 (ref 5–15)
BUN: 46 mg/dL — ABNORMAL HIGH (ref 8–23)
CO2: 26 mmol/L (ref 22–32)
Calcium: 9.2 mg/dL (ref 8.9–10.3)
Chloride: 95 mmol/L — ABNORMAL LOW (ref 98–111)
Creatinine, Ser: 2.4 mg/dL — ABNORMAL HIGH (ref 0.44–1.00)
GFR, Estimated: 21 mL/min — ABNORMAL LOW (ref >60)
Glucose, Bld: 166 mg/dL — ABNORMAL HIGH (ref 70–99)
Potassium: 3.2 mmol/L — ABNORMAL LOW (ref 3.5–5.1)
Sodium: 132 mmol/L — ABNORMAL LOW (ref 135–145)

## 2022-04-25 LAB — GLUCOSE, CAPILLARY
Glucose-Capillary: 161 mg/dL — ABNORMAL HIGH (ref 70–99)
Glucose-Capillary: 222 mg/dL — ABNORMAL HIGH (ref 70–99)
Glucose-Capillary: 174 mg/dL — ABNORMAL HIGH (ref 70–99)
Glucose-Capillary: 155 mg/dL — ABNORMAL HIGH (ref 70–99)

## 2022-04-25 LAB — MAGNESIUM: Magnesium: 2 mg/dL (ref 1.7–2.4)

## 2022-04-25 MED ORDER — INSULIN GLARGINE-YFGN 100 UNIT/ML ~~LOC~~ SOLN
15.0000 [IU] | Freq: Every day | SUBCUTANEOUS | Status: DC
  Administered 2022-04-25 – 2022-04-27 (×3): 15 [IU] via SUBCUTANEOUS
  Filled 2022-04-25 (×4): qty 0.15

## 2022-04-25 MED ORDER — POTASSIUM CHLORIDE CRYS ER 20 MEQ PO TBCR
40.0000 meq | EXTENDED_RELEASE_TABLET | Freq: Four times a day (QID) | ORAL | Status: AC
  Administered 2022-04-25 (×2): 40 meq via ORAL
  Filled 2022-04-25 (×2): qty 2

## 2022-04-25 MED ORDER — FUROSEMIDE 10 MG/ML IJ SOLN
80.0000 mg | Freq: Three times a day (TID) | INTRAMUSCULAR | Status: DC
  Administered 2022-04-26 – 2022-05-01 (×16): 80 mg via INTRAVENOUS
  Filled 2022-04-25 (×16): qty 8

## 2022-04-25 MED ORDER — METOLAZONE 2.5 MG PO TABS
2.5000 mg | ORAL_TABLET | Freq: Once | ORAL | Status: AC
  Administered 2022-04-25: 2.5 mg via ORAL
  Filled 2022-04-25: qty 1

## 2022-04-25 MED ORDER — FUROSEMIDE 10 MG/ML IJ SOLN
80.0000 mg | Freq: Three times a day (TID) | INTRAMUSCULAR | Status: AC
  Administered 2022-04-25 (×3): 80 mg via INTRAVENOUS
  Filled 2022-04-25 (×2): qty 8

## 2022-04-25 MED ORDER — ACETAZOLAMIDE 250 MG PO TABS
500.0000 mg | ORAL_TABLET | Freq: Once | ORAL | Status: AC
  Administered 2022-04-25: 500 mg via ORAL
  Filled 2022-04-25: qty 2

## 2022-04-25 NOTE — Progress Notes (Signed)
Subjective:   Hospital day 7.  Interval events: No acute events overnight.  Feels better day by day.  Still complains of bilateral lower leg swelling, however notes that it is improved since admission.  Complains of pain in her left leg.  No chest pain.  No new or worsening shortness of breath.  No new or worsening orthopnea.  Objective:  Vital signs: Blood pressure (!) 113/56, pulse (!) 57, temperature 97.6 F (36.4 C), temperature source Oral, resp. rate 17, height 4' 9.5" (1.461 m), weight 85.5 kg, SpO2 97 %.  Physical exam: Constitutional: Laying in bed.  No distress. Cardiovascular: Regular rate and rhythm.   Pulmonary: Normal work of breathing.  Anterior lung fields clear. Abdominal: Soft.  Nontender. Skin: Warm and dry. Extremities: Pitting edema past the knees. Neuro: Alert. Psych: Normal mood and affect.   Intake/Output Summary (Last 24 hours) at 04/25/2022 1153 Last data filed at 04/25/2022 0600 Gross per 24 hour  Intake 560 ml  Output 1802 ml  Net -1242 ml    Pertinent Labs:    Latest Ref Rng & Units 04/19/2022    3:36 AM 04/18/2022   10:17 AM 04/12/2022    2:40 PM  CBC  WBC 4.0 - 10.5 K/uL 6.9  7.2  7.3   Hemoglobin 12.0 - 15.0 g/dL 9.1  9.8  9.4   Hematocrit 36.0 - 46.0 % 27.9  30.2  29.1   Platelets 150 - 400 K/uL 145  165  240        Latest Ref Rng & Units 04/25/2022    5:54 AM 04/24/2022    5:53 AM 04/23/2022    6:52 PM  CMP  Glucose 70 - 99 mg/dL 166  183  226   BUN 8 - 23 mg/dL 46  41  43   Creatinine 0.44 - 1.00 mg/dL 2.40  2.36  2.41   Sodium 135 - 145 mmol/L 132  135  136   Potassium 3.5 - 5.1 mmol/L 3.2  3.7  3.7   Chloride 98 - 111 mmol/L 95  98  99   CO2 22 - 32 mmol/L 26  25  27    Calcium 8.9 - 10.3 mg/dL 9.2  9.2  9.1    Assessment/Plan:   Principal Problem:   Acute exacerbation of congestive heart failure (HCC) Active Problems:   Benign essential hypertension   CAD (coronary artery disease) of bypass graft    Uncontrolled type 2 diabetes mellitus with hyperglycemia (HCC)   OSA on CPAP   Patient Summary: Alexandra Baker is a 71 y.o. with a PMH of heart failure with moderately reduced ejection fraction, CKD 3, ASCVD, and type 2 diabetes, who presented with several weeks of progressive shortness of breath, weight gain, and edema and was admitted for heart failure exacerbation, complicated by AKI.   Decompensated heart failure Heart failure with moderately reduced ejection fraction No new or worsening symptoms today.  Almost 2 L urine output yesterday.  Weight down 6 pounds since admission.  On exam, she has signs of volume overload including bilateral lower extremity edema.  No overtly elevated JVP.  My impression is that she is improving.  Diuresing well without worsening kidney function.  Mild hypokalemia today.  Continue diuresis today. - Acetazolamide 500 mg p.o. once today - Metolazone 2.5 mg p.o. once today - Continue furosemide 80 mg IV 3 times daily - Continue carvedilol 6.25  mg twice daily - Continue dapagliflozin 10 mg daily - Continue isosorbide mononitrate 30 mg daily - Continue hydralazine 10 mg every 8 hours  Mild hypokalemia Due to aggressive diuresis in the setting of CKD. - Potassium chloride 80 mEq in divided doses today  AKI on CKD 3 versus progression of CKD 4 In the setting of decompensated heart failure and decreased renal perfusion.  Creatinine stable.  Time will tell. - Continue holding Entresto for now until we peel off some diuretics  Left lower extremity venous stasis wound Remains painful.  Encouraged good movement to help with the lower extremity swelling. - Apply bilateral Unna boots - Continue acetaminophen 650 mg every 6 hours as needed for mild pain  Type 2 diabetes A1c 9.4.  Sugars hovering around 200 for the past 24 hours. - Continue insulin glargine 13 units nightly - Continue sliding scale aspart 3 times daily with meals  ASCVD History of coronary  artery disease and CVA. - Continue Plavix 75 mg daily - Continue rosuvastatin 40 mg daily  Diet: Carb-Modified IVF: None, VTE: Enoxaparin Code: Full PT/OT recs: SNF for Subacute PT, walker. TOC recs: Will pursue insurance authorization for SNF closer to discharge date.  Dispo: Anticipated discharge to Skilled nursing facility in 2 days pending treatment of heart failure exacerbation.   Nani Gasser MD 04/25/2022, 11:53 AM  Pager: [phone] After 5 pm on weekdays and 1 pm on weekends: On Call pager: [phone]

## 2022-04-25 NOTE — Plan of Care (Signed)
  Problem: Nutritional: Goal: Progress toward achieving an optimal weight will improve Outcome: Completed/Met

## 2022-04-25 NOTE — Progress Notes (Signed)
Orthopedic Tech Progress Note Patient Details:  Alexandra Baker [birth year] [id number]  Ortho Devices Type of Ortho Device: Haematologist Ortho Device/Splint Location: BLE Ortho Device/Splint Interventions: Application, Ordered   Post Interventions Patient Tolerated: Well  [name] A Lathan Gieselman 04/25/2022, 4:28 PM

## 2022-04-25 NOTE — Progress Notes (Signed)
Physical Therapy Treatment Patient Details Name: EVANY SCHECTER MRN: [id number] DOB: January 23, 1951 Today's Date: 04/25/2022   History of Present Illness Patient reports falling three months ago and hurt her leg. Had cellulitis and abscess in leg that got drained and was also treated with antibiotics. Re-hospitalized 8/3-8/14/23 for persistent cellulitis of left lower extremity and found to have slurred speech and reported left facial droop. 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 Pt was admitted to [facility] on 04/18/22 from [facility]F presenting with SOB with dx: acute exacerbation CHF. PMH includes but is not limited to: DM2, osteoporosis, cellulitis LLE, coronary disease, GERD, HTN & memory loss.    PT Comments    Pt a little more fatigued today and chronic sciatic pain bothering her. Continue to recommend return to SNF at DC.    Recommendations for follow up therapy are one component of a multi-disciplinary discharge planning process, led by the attending physician.  Recommendations may be updated based on patient status, additional functional criteria and insurance authorization.  Follow Up Recommendations  Skilled nursing-short term rehab (<3 hours/day) Can patient physically be transported by private vehicle: Yes   Assistance Recommended at Discharge Intermittent Supervision/Assistance  Patient can return home with the following A little help with walking and/or transfers;A little help with bathing/dressing/bathroom;Assistance with cooking/housework;Assist for transportation;Help with stairs or ramp for entrance   Equipment Recommendations  Rollator (4 wheels)    Recommendations for Other Services       Precautions / Restrictions Precautions Precautions: Fall Restrictions Weight Bearing Restrictions: No     Mobility  Bed Mobility Overal bed mobility: Needs Assistance Bed Mobility: Sit to Supine, Supine to Sit     Supine to sit: Min guard Sit  to supine: Min assist   General bed mobility comments: Assist to bring legs up into bed    Transfers Overall transfer level: Needs assistance Equipment used: Rollator (4 wheels) Transfers: Sit to/from Stand Sit to Stand: Min assist           General transfer comment: Assist to steady on rising    Ambulation/Gait Ambulation/Gait assistance: Min guard Gait Distance (Feet):  (20' x 2) Assistive device: Rollator (4 wheels) Gait Pattern/deviations: Step-through pattern, Decreased stride length, Trunk flexed Gait velocity: decr Gait velocity interpretation: <1.31 ft/sec, indicative of household ambulator   General Gait Details: Assist for safety. 1 seated rest break on rollator.   Stairs             Wheelchair Mobility    Modified Rankin (Stroke Patients Only)       Balance Overall balance assessment: Needs assistance Sitting-balance support: No upper extremity supported, Feet unsupported, Feet supported Sitting balance-Leahy Scale: Good     Standing balance support: During functional activity, Single extremity supported Standing balance-Leahy Scale: Poor Standing balance comment: Using at least single extremity support                            Cognition Arousal/Alertness: Awake/alert Behavior During Therapy: WFL for tasks assessed/performed Overall Cognitive Status: Within Functional Limits for tasks assessed                                          Exercises      General Comments        Pertinent Vitals/Pain Pain Assessment Pain Assessment: Faces Faces Pain  Scale: Hurts little more Pain Location: lt back and hip Pain Descriptors / Indicators: Aching Pain Intervention(s): Limited activity within patient's tolerance, Repositioned, Monitored during session    Home Living                          Prior Function            PT Goals (current goals can now be found in the care plan section) Acute Rehab  PT Goals Patient Stated Goal: to go home Progress towards PT goals: Progressing toward goals    Frequency    Min 2X/week      PT Plan Current plan remains appropriate;Frequency needs to be updated    Co-evaluation              AM-PAC PT "6 Clicks" Mobility   Outcome Measure  Help needed turning from your back to your side while in a flat bed without using bedrails?: A Little Help needed moving from lying on your back to sitting on the side of a flat bed without using bedrails?: A Little Help needed moving to and from a bed to a chair (including a wheelchair)?: A Little Help needed standing up from a chair using your arms (e.g., wheelchair or bedside chair)?: A Little Help needed to walk in hospital room?: A Little Help needed climbing 3-5 steps with a railing? : Total 6 Click Score: 16    End of Session Equipment Utilized During Treatment: Gait belt Activity Tolerance: Patient limited by fatigue Patient left: with call bell/phone within reach;in bed;with bed alarm set Nurse Communication: Mobility status PT Visit Diagnosis: Muscle weakness (generalized) (M62.81)     Time: 8115-7262 PT Time Calculation (min) (ACUTE ONLY): 21 min  Charges:  $Gait Training: 8-22 mins                     [place] [facility] [place] 04/25/2022, 5:08 PM

## 2022-04-25 NOTE — Progress Notes (Signed)
Mobility Specialist Progress Note:   04/25/22 1117  Mobility  Activity Ambulated with assistance in room;Ambulated with assistance to bathroom  Level of Assistance Minimal assist, patient does 75% or more  Assistive Device Four wheel walker  Distance Ambulated (ft) 30 ft  Activity Response Tolerated well  $Mobility charge 1 Mobility   Pt received in bed willing to participate in mobility. Complaints of legs hurting. MinA to stand from toilet. Left in bed with call bell in reach and all needs met.   [facility] Surveyor, mining Chat only

## 2022-04-26 ENCOUNTER — Encounter (HOSPITAL_COMMUNITY): Payer: Medicare Other

## 2022-04-26 DIAGNOSIS — I5023 Acute on chronic systolic (congestive) heart failure: Secondary | ICD-10-CM | POA: Diagnosis not present

## 2022-04-26 DIAGNOSIS — E1165 Type 2 diabetes mellitus with hyperglycemia: Secondary | ICD-10-CM | POA: Diagnosis not present

## 2022-04-26 DIAGNOSIS — Z6841 Body Mass Index (BMI) 40.0 and over, adult: Secondary | ICD-10-CM

## 2022-04-26 DIAGNOSIS — N179 Acute kidney failure, unspecified: Secondary | ICD-10-CM | POA: Diagnosis not present

## 2022-04-26 LAB — BASIC METABOLIC PANEL
Anion gap: 12 (ref 5–15)
BUN: 46 mg/dL — ABNORMAL HIGH (ref 8–23)
CO2: 25 mmol/L (ref 22–32)
Calcium: 9.2 mg/dL (ref 8.9–10.3)
Chloride: 96 mmol/L — ABNORMAL LOW (ref 98–111)
Creatinine, Ser: 2.34 mg/dL — ABNORMAL HIGH (ref 0.44–1.00)
GFR, Estimated: 22 mL/min — ABNORMAL LOW (ref >60)
Glucose, Bld: 150 mg/dL — ABNORMAL HIGH (ref 70–99)
Potassium: 3.5 mmol/L (ref 3.5–5.1)
Sodium: 133 mmol/L — ABNORMAL LOW (ref 135–145)

## 2022-04-26 LAB — GLUCOSE, CAPILLARY
Glucose-Capillary: 129 mg/dL — ABNORMAL HIGH (ref 70–99)
Glucose-Capillary: 226 mg/dL — ABNORMAL HIGH (ref 70–99)
Glucose-Capillary: 178 mg/dL — ABNORMAL HIGH (ref 70–99)
Glucose-Capillary: 177 mg/dL — ABNORMAL HIGH (ref 70–99)

## 2022-04-26 MED ORDER — NAPHAZOLINE-GLYCERIN 0.012-0.25 % OP SOLN
1.0000 [drp] | Freq: Four times a day (QID) | OPHTHALMIC | Status: DC | PRN
  Administered 2022-04-26: 2 [drp] via OPHTHALMIC
  Administered 2022-04-27: 1 [drp] via OPHTHALMIC
  Administered 2022-05-02: 2 [drp] via OPHTHALMIC
  Administered 2022-05-02: 1 [drp] via OPHTHALMIC
  Filled 2022-04-26: qty 15

## 2022-04-26 MED ORDER — POTASSIUM CHLORIDE CRYS ER 20 MEQ PO TBCR
40.0000 meq | EXTENDED_RELEASE_TABLET | Freq: Four times a day (QID) | ORAL | Status: AC
  Administered 2022-04-26 (×2): 40 meq via ORAL
  Filled 2022-04-26 (×2): qty 2

## 2022-04-26 MED ORDER — METOLAZONE 5 MG PO TABS
5.0000 mg | ORAL_TABLET | Freq: Once | ORAL | Status: AC
  Administered 2022-04-26: 5 mg via ORAL
  Filled 2022-04-26: qty 1

## 2022-04-26 MED ORDER — ACETAZOLAMIDE 250 MG PO TABS
250.0000 mg | ORAL_TABLET | Freq: Once | ORAL | Status: AC
  Administered 2022-04-26: 250 mg via ORAL
  Filled 2022-04-26: qty 1

## 2022-04-26 NOTE — Progress Notes (Signed)
Occupational Therapy Treatment Patient Details Name: Alexandra Baker MRN: [id number] DOB: 1950-11-08 Today's Date: 04/26/2022   History of present illness Patient reports falling three months ago and hurt her leg. Had cellulitis and abscess in leg that got drained and was also treated with antibiotics. Re-hospitalized 8/3-8/14/23 for persistent cellulitis of left lower extremity and found to have slurred speech and reported left facial droop. 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 Pt was admitted to [place] on 04/18/22 from [facility] presenting with SOB with dx: acute exacerbation CHF. PMH includes but is not limited to: DM2, osteoporosis, cellulitis LLE, coronary disease, GERD, HTN & memory loss.   OT comments  Patient received in supine and agreeable to OT session. Patient required assistance go get to EOB due to unable to slide BLEs due to wraps. Patient able to stand from EOB with min assist and education on hand placement. Patient provided education on adaptive equipment for LB dressing with reacher, sock aide, dressing stick, LH shoehorn, and LH sponge. Patient practiced use of dressing stick with threading legs into pants and mod assist. Patient unable to use sock aide due to wraps. Patient provided handout for energy conservation strategies and reviewed with patient. Patient is making good progress and discharge recommendations continue to be appropriate.    Recommendations for follow up therapy are one component of a multi-disciplinary discharge planning process, led by the attending physician.  Recommendations may be updated based on patient status, additional functional criteria and insurance authorization.    Follow Up Recommendations  Skilled nursing-short term rehab (<3 hours/day)    Assistance Recommended at Discharge Intermittent Supervision/Assistance  Patient can return home with the following  A little help with walking and/or transfers;A little  help with bathing/dressing/bathroom;Assistance with cooking/housework;Help with stairs or ramp for entrance   Equipment Recommendations  Other (comment) (defer to next venue)    Recommendations for Other Services      Precautions / Restrictions Precautions Precautions: Fall Restrictions Weight Bearing Restrictions: No       Mobility Bed Mobility Overal bed mobility: Needs Assistance Bed Mobility: Supine to Sit     Supine to sit: Min assist     General bed mobility comments: min assist for BLEs    Transfers Overall transfer level: Needs assistance Equipment used: Rolling walker (2 wheels) Transfers: Sit to/from Stand, Bed to chair/wheelchair/BSC Sit to Stand: Min assist     Step pivot transfers: Min guard     General transfer comment: able to manage walker for transfer     Balance Overall balance assessment: Needs assistance Sitting-balance support: No upper extremity supported, Feet unsupported, Feet supported Sitting balance-Leahy Scale: Good     Standing balance support: Bilateral upper extremity supported, During functional activity Standing balance-Leahy Scale: Poor Standing balance comment: reliant on RW for transfer                           ADL either performed or assessed with clinical judgement   ADL Overall ADL's : Needs assistance/impaired                 Upper Body Dressing : Set up;Sitting Upper Body Dressing Details (indicate cue type and reason): change gown Lower Body Dressing: Maximal assistance;Moderate assistance;Sit to/from stand Lower Body Dressing Details (indicate cue type and reason): education on adaptive equipment for LB dressing. Patient performed donning pants with dressing stick. Unable to address sock aide due to leg wraps  General ADL Comments: demonstrated good understanding of adaptive equipment    Extremity/Trunk Assessment              Vision       Perception     Praxis       Cognition Arousal/Alertness: Awake/alert Behavior During Therapy: WFL for tasks assessed/performed Overall Cognitive Status: Within Functional Limits for tasks assessed                                 General Comments: Chart review indicates h/o memory loss at baseline        Exercises      Shoulder Instructions       General Comments provided list of adaptive equipment. provided handout for energy conservation and reviewed with patient    Pertinent Vitals/ Pain       Pain Assessment Pain Assessment: Faces Faces Pain Scale: Hurts a little bit Pain Location: back and hip Pain Descriptors / Indicators: Aching Pain Intervention(s): Limited activity within patient's tolerance, Monitored during session, Repositioned  Home Living                                          Prior Functioning/Environment              Frequency  Min 2X/week        Progress Toward Goals  OT Goals(current goals can now be found in the care plan section)  Progress towards OT goals: Progressing toward goals  Acute Rehab OT Goals Patient Stated Goal: go home OT Goal Formulation: With patient Time For Goal Achievement: 05/03/22 Potential to Achieve Goals: Fair ADL Goals Pt Will Perform Grooming: with modified independence;standing;sitting Pt Will Perform Lower Body Dressing: with modified independence;sitting/lateral leans;sit to/from stand;with adaptive equipment Pt Will Transfer to Toilet: with modified independence;ambulating;bedside commode;regular height toilet Pt Will Perform Toileting - Clothing Manipulation and hygiene: with modified independence;sitting/lateral leans;sit to/from stand Additional ADL Goal #1: Pt will implement energy conservation techniques following written and verbal instruction for increased independence with ADL's  Plan Discharge plan remains appropriate    Co-evaluation                 AM-PAC OT "6 Clicks" Daily  Activity     Outcome Measure   Help from another person eating meals?: None Help from another person taking care of personal grooming?: A Little Help from another person toileting, which includes using toliet, bedpan, or urinal?: A Little Help from another person bathing (including washing, rinsing, drying)?: A Little Help from another person to put on and taking off regular upper body clothing?: None Help from another person to put on and taking off regular lower body clothing?: A Little 6 Click Score: 20    End of Session Equipment Utilized During Treatment: Rolling walker (2 wheels)  OT Visit Diagnosis: Other abnormalities of gait and mobility (R26.89);Pain   Activity Tolerance Patient tolerated treatment well   Patient Left in chair;with call bell/phone within reach   Nurse Communication Mobility status        Time: 1194-1740 OT Time Calculation (min): 33 min  Charges: OT General Charges $OT Visit: 1 Visit OT Treatments $Self Care/Home Management : 23-37 mins  Lodema Hong, [place]  Office [place] 04/26/2022, 3:07 PM

## 2022-04-26 NOTE — Progress Notes (Signed)
Subjective:   Hospital day 8.  Interval events: Bilateral Unna boots applied.  Worked with physical therapy.  Continues to do well.  No interval development of new or worsening heart failure symptoms.  Wants to avoid future hospitalizations for heart failure exacerbation.  Objective:  Vital signs: Blood pressure 114/67, pulse 62, temperature 97.6 F (36.4 C), temperature source Oral, resp. rate 18, height 4' 9.5" (1.461 m), weight 83.7 kg, SpO2 97 %.  Physical exam: Constitutional: Laying in bed.  No distress. Cardiovascular: Regular rate and rhythm.   Pulmonary: Normal work of breathing.  Anterior lung fields clear. Abdominal: Soft.  Nontender. Skin: Warm and dry. Extremities: Pitting edema past the knees.  Bilateral Unna boots.  Good capillary refill bilateral toes. Neuro: Alert. Psych: Normal mood and affect.   Intake/Output Summary (Last 24 hours) at 04/26/2022 1335 Last data filed at 04/26/2022 0601 Gross per 24 hour  Intake 480 ml  Output 1401 ml  Net -921 ml    Pertinent Labs:    Latest Ref Rng & Units 04/19/2022    3:36 AM 04/18/2022   10:17 AM 04/12/2022    2:40 PM  CBC  WBC 4.0 - 10.5 K/uL 6.9  7.2  7.3   Hemoglobin 12.0 - 15.0 g/dL 9.1  9.8  9.4   Hematocrit 36.0 - 46.0 % 27.9  30.2  29.1   Platelets 150 - 400 K/uL 145  165  240        Latest Ref Rng & Units 04/26/2022   12:42 AM 04/25/2022    5:54 AM 04/24/2022    5:53 AM  CMP  Glucose 70 - 99 mg/dL 150  166  183   BUN 8 - 23 mg/dL 46  46  41   Creatinine 0.44 - 1.00 mg/dL 2.34  2.40  2.36   Sodium 135 - 145 mmol/L 133  132  135   Potassium 3.5 - 5.1 mmol/L 3.5  3.2  3.7   Chloride 98 - 111 mmol/L 96  95  98   CO2 22 - 32 mmol/L 25  26  25    Calcium 8.9 - 10.3 mg/dL 9.2  9.2  9.2    Assessment/Plan:   Principal Problem:   Acute exacerbation of congestive heart failure (HCC) Active Problems:   Benign essential hypertension   CAD (coronary artery disease) of bypass graft    Uncontrolled type 2 diabetes mellitus with hyperglycemia ([place])   Morbid obesity with BMI of 40.0-44.9, adult (HCC)   Stage 3b chronic kidney disease (HCC)   OSA on CPAP   Patient Summary: Alexandra Baker is a 71 y.o. with a PMH of HFmrEF, CKD 3, ASCVD, type 2 diabetes, who presented with several weeks of progressive shortness of breath, weight gain, and edema and was admitted for heart failure exacerbation likely due to ineffective diuresis in the setting of worsening chronic kidney disease, currently receiving IV diuresis to good effect.  Decompensated heart failure Heart failure with moderately reduced ejection fraction Continues to improve.  No new or worsening symptoms today.  Weight down about 2 kg today.  -931 cc over the last 24 hours.  Remains hypervolemic on exam with pitting edema past knees, and sacral edema.  Electrolytes and kidney function stable.  Continue IV diuresis today. - Increase metolazone 5 mg p.o. daily - Continue acetazolamide 500 mg p.o. daily - Continue furosemide 80 mg IV 3 times daily -  Continue carvedilol 6.25 mg twice daily - Continue dapagliflozin 10 mg daily - Continue isosorbide mononitrate 30 mg daily - Continue hydralazine 10 mg every 8 hours  Type 2 diabetes Sugars hovering around 200 yesterday.  Aiming for a goal of 180 or less on average.  Increased Semglee yesterday afternoon. - Continue Semglee 15 units - Continue NovoLog correctional dose 3 times daily with meals  AKI on CKD 3 versus progression of CKD 4 In the setting of decompensated heart failure and decreased renal perfusion.  Creatinine stable.  Time will tell. - Continue holding Entresto for now until we peel off some diuretics   Left lower extremity venous stasis wound Bilateral Unna boots applied.  Both extremities are warm dry and well-perfused.  Low index of suspicion for arterial insufficiency based on nature of her wound including report of preceding trauma to the area, however  given this patient's history of ASCVD, cannot rule out PAD. - Apply bilateral Unna boots - Continue acetaminophen 650 mg every 6 hours as needed for mild pain - Follow-up ABIs when completed  ASCVD History of coronary artery disease and CVA. - Continue Plavix 75 mg daily - Continue rosuvastatin 40 mg daily  Diet: Carb-Modified IVF: None, VTE: Enoxaparin Code: Full PT/OT recs: SNF for Subacute PT, walker. TOC recs: Will pursue insurance authorization for SNF closer to discharge date.   Dispo: Anticipated discharge to Skilled nursing facility in 2 days pending treatment of heart failure exacerbation.   Nani Gasser MD 04/26/2022, 1:35 PM  Pager: [phone] After 5pm on weekdays and 1pm on weekends: On Call pager: [phone]

## 2022-04-26 NOTE — Progress Notes (Signed)
   04/26/22 1005  Mobility  Activity Transferred to/from [facility]  Level of Assistance Minimal assist, patient does 75% or more  Assistive Device None;Other (Comment) (HHA)  Distance Ambulated (ft) 10 ft  Activity Response Tolerated well  $Mobility charge 1 Mobility   Mobility Specialist Progress Note  Pt was in chair and agreeable. C/o leg pain. Returned to bed w/ all needs met and call bell in reach.   Alexandra Baker Mobility Specialist

## 2022-04-26 NOTE — TOC Progression Note (Signed)
Transition of Care [facility]) - Progression Note    Patient Details  Name: Alexandra Baker MRN: [id number] Date of Birth: 08/29/1950  Transition of Care [facility]) CM/SW [place], [place] Phone Number: 04/26/2022, 1:13 PM  Clinical Narrative:     CSW received voicemail from [place] at [facility] requesting updated clinicals. CSW faxed clinicals to [phone] and notified Dee.   Expected Discharge Plan: [place] Barriers to Discharge: Continued Medical Work up  Expected Discharge Plan and Services Expected Discharge Plan: [place] arrangements for the past 2 months: [place]                                       Social Determinants of Health (SDOH) Interventions    Readmission Risk Interventions     No data to display

## 2022-04-27 DIAGNOSIS — I130 Hypertensive heart and chronic kidney disease with heart failure and stage 1 through stage 4 chronic kidney disease, or unspecified chronic kidney disease: Secondary | ICD-10-CM | POA: Diagnosis not present

## 2022-04-27 DIAGNOSIS — N179 Acute kidney failure, unspecified: Secondary | ICD-10-CM | POA: Diagnosis not present

## 2022-04-27 DIAGNOSIS — I5023 Acute on chronic systolic (congestive) heart failure: Secondary | ICD-10-CM | POA: Diagnosis not present

## 2022-04-27 LAB — BASIC METABOLIC PANEL
Anion gap: 13 (ref 5–15)
BUN: 49 mg/dL — ABNORMAL HIGH (ref 8–23)
CO2: 26 mmol/L (ref 22–32)
Calcium: 9.4 mg/dL (ref 8.9–10.3)
Chloride: 96 mmol/L — ABNORMAL LOW (ref 98–111)
Creatinine, Ser: 2.44 mg/dL — ABNORMAL HIGH (ref 0.44–1.00)
GFR, Estimated: 21 mL/min — ABNORMAL LOW (ref >60)
Glucose, Bld: 172 mg/dL — ABNORMAL HIGH (ref 70–99)
Potassium: 4 mmol/L (ref 3.5–5.1)
Sodium: 135 mmol/L (ref 135–145)

## 2022-04-27 LAB — GLUCOSE, CAPILLARY
Glucose-Capillary: 145 mg/dL — ABNORMAL HIGH (ref 70–99)
Glucose-Capillary: 194 mg/dL — ABNORMAL HIGH (ref 70–99)
Glucose-Capillary: 187 mg/dL — ABNORMAL HIGH (ref 70–99)
Glucose-Capillary: 223 mg/dL — ABNORMAL HIGH (ref 70–99)

## 2022-04-27 MED ORDER — METOLAZONE 5 MG PO TABS
5.0000 mg | ORAL_TABLET | Freq: Once | ORAL | Status: AC
  Administered 2022-04-27: 5 mg via ORAL
  Filled 2022-04-27: qty 1

## 2022-04-27 MED ORDER — POTASSIUM CHLORIDE CRYS ER 20 MEQ PO TBCR
40.0000 meq | EXTENDED_RELEASE_TABLET | Freq: Once | ORAL | Status: AC
  Administered 2022-04-27: 40 meq via ORAL
  Filled 2022-04-27: qty 2

## 2022-04-27 MED ORDER — ACETAZOLAMIDE 250 MG PO TABS
500.0000 mg | ORAL_TABLET | Freq: Once | ORAL | Status: AC
  Administered 2022-04-27: 500 mg via ORAL
  Filled 2022-04-27: qty 2

## 2022-04-27 NOTE — Progress Notes (Signed)
   04/27/22 1042  Mobility  Activity Ambulated with assistance in room  Level of Assistance Contact guard assist, steadying assist  Assistive Device Four wheel walker  Distance Ambulated (ft) 15 ft  Activity Response Tolerated well  $Mobility charge 1 Mobility   Mobility Specialist Progress Note  Pt was EOB and agreeable. Mobility cut short d/t leg pain. Returned back to bed w/ all needs met and call bell in reach.   Lucious Groves Mobility Specialist

## 2022-04-27 NOTE — Progress Notes (Signed)
Subjective:   Hospital day 9.  Interval events: No overnight events.  Continues to do well.  No interval development of new or worsening heart failure symptoms.  Does complain of some itching underneath the Unna boots.  Objective:  Vital signs: Blood pressure 124/60, pulse 60, temperature 98.4 F (36.9 C), temperature source Oral, resp. rate 18, height 4' 9.5" (1.461 m), weight 81.9 kg, SpO2 96 %.  Physical exam: Constitutional: Laying in bed.  No distress. Cardiovascular: Regular rate and rhythm.   Pulmonary: Normal work of breathing.  Anterior lung fields clear. Abdominal: Soft.  Nontender. Skin: Warm and dry. Extremities: Pitting edema past the knees.  Bilateral Unna boots.  Good capillary refill bilateral toes. Neuro: Alert. Psych: Normal mood and affect.   Intake/Output Summary (Last 24 hours) at 04/27/2022 0804 Last data filed at 04/27/2022 0713 Gross per 24 hour  Intake 960 ml  Output 2100 ml  Net -1140 ml    Pertinent Labs:    Latest Ref Rng & Units 04/19/2022    3:36 AM 04/18/2022   10:17 AM 04/12/2022    2:40 PM  CBC  WBC 4.0 - 10.5 K/uL 6.9  7.2  7.3   Hemoglobin 12.0 - 15.0 g/dL 9.1  9.8  9.4   Hematocrit 36.0 - 46.0 % 27.9  30.2  29.1   Platelets 150 - 400 K/uL 145  165  240        Latest Ref Rng & Units 04/27/2022   12:55 AM 04/26/2022   12:42 AM 04/25/2022    5:54 AM  CMP  Glucose 70 - 99 mg/dL 172  150  166   BUN 8 - 23 mg/dL 49  46  46   Creatinine 0.44 - 1.00 mg/dL 2.44  2.34  2.40   Sodium 135 - 145 mmol/L 135  133  132   Potassium 3.5 - 5.1 mmol/L 4.0  3.5  3.2   Chloride 98 - 111 mmol/L 96  96  95   CO2 22 - 32 mmol/L 26  25  26    Calcium 8.9 - 10.3 mg/dL 9.4  9.2  9.2    Assessment/Plan:   Principal Problem:   Acute exacerbation of congestive heart failure (HCC) Active Problems:   Benign essential hypertension   CAD (coronary artery disease) of bypass graft   Uncontrolled type 2 diabetes mellitus with hyperglycemia  ([place])   Morbid obesity with BMI of 40.0-44.9, adult (HCC)   Stage 3b chronic kidney disease (HCC)   OSA on CPAP   Patient Summary: Alexandra Baker is a 71 y.o. with a PMH of HFmrEF, CKD 3, ASCVD, type 2 diabetes, who presented with several weeks of progressive shortness of breath, weight gain, and edema and was admitted for heart failure exacerbation likely due to ineffective diuresis in the setting of worsening chronic kidney disease, currently receiving IV diuresis to good effect.  Decompensated heart failure Heart failure with moderately reduced ejection fraction Weight down another 2 kg.  Remains hypervolemic on exam.  Electrolytes and kidney function stable.  Continue IV diuresis today.  Will administer potassium in anticipation of potassium wasting with diuresis. - Continue metolazone 5 mg p.o. daily - Continue acetazolamide 500 mg p.o. daily - Continue furosemide 80 mg IV 3 times daily - Continue carvedilol 6.25 mg twice daily - Continue dapagliflozin 10 mg daily - Continue isosorbide mononitrate 30 mg daily - Continue hydralazine 10  mg every 8 hours - 40 mEq potassium  Type 2 diabetes Improved glycemic control today with sugars hovering around 160-180. - Continue Semglee 15 units - Continue NovoLog correctional dose 3 times daily with meals   AKI on CKD 3 versus progression of CKD 4 In the setting of decompensated heart failure and decreased renal perfusion.  Creatinine stable.  Time will tell. - Continue holding Entresto for now until we peel off some diuretics  Left lower extremity venous stasis wound Bilateral Unna boots applied.  Both extremities are warm dry and well-perfused.  - Apply bilateral Unna boots - Continue acetaminophen 650 mg every 6 hours as needed for mild pain - Follow-up ABIs when completed   ASCVD History of coronary artery disease and CVA. - Continue Plavix 75 mg daily - Continue rosuvastatin 40 mg daily  Diet: Carb-Modified IVF: None VTE:  Enoxaparin Code: Full PT/OT recs: SNF for Subacute PT, walker. TOC recs: Will pursue insurance authorization for SNF closer to discharge date.   Dispo: Anticipated discharge to Skilled nursing facility in 2 days pending treatment of heart failure exacerbation.  Nani Gasser MD 04/27/2022, 8:04 AM  Pager: [phone] After 5pm on weekdays and 1pm on weekends: On Call pager: [phone]

## 2022-04-27 NOTE — Progress Notes (Signed)
Physical Therapy Treatment Patient Details Name: Alexandra Baker MRN: [id number] DOB: [birth year] Today's Date: 04/27/2022   History of Present Illness Patient reports falling three months ago and hurt her leg. Had cellulitis and abscess in leg that got drained and was also treated with antibiotics. Re-hospitalized 8/3-8/14/23 for persistent cellulitis of left lower extremity and found to have slurred speech and reported left facial droop. 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 Pt was admitted to [place] on 04/18/22 from [facility] presenting with SOB with dx: acute exacerbation CHF. PMH includes but is not limited to: DM2, osteoporosis, cellulitis LLE, coronary disease, GERD, HTN & memory loss.    PT Comments    Pt making gradual progress. She wanted to try ambulation without AD but had antalgic pattern - continue to recommend use of AD.  Pt declined further ambulation due to fatigue and not wanting to overdo but was able to perform LE exercises to assist with circulation and strengthening.    Recommendations for follow up therapy are one component of a multi-disciplinary discharge planning process, led by the attending physician.  Recommendations may be updated based on patient status, additional functional criteria and insurance authorization.  Follow Up Recommendations  Skilled nursing-short term rehab (<3 hours/day) Can patient physically be transported by private vehicle: Yes   Assistance Recommended at Discharge Intermittent Supervision/Assistance  Patient can return home with the following A little help with walking and/or transfers;A little help with bathing/dressing/bathroom;Assistance with cooking/housework;Assist for transportation;Help with stairs or ramp for entrance   Equipment Recommendations  Rollator (4 wheels)    Recommendations for Other Services       Precautions / Restrictions Precautions Precautions: Fall     Mobility  Bed  Mobility               General bed mobility comments: in chair    Transfers Overall transfer level: Needs assistance Equipment used: 1 person hand held assist Transfers: Sit to/from Stand Sit to Stand: Min assist           General transfer comment: Min A to steady; increased time    Ambulation/Gait Ambulation/Gait assistance: Min assist Gait Distance (Feet): 20 Feet Assistive device: 1 person hand held assist Gait Pattern/deviations: Step-to pattern, Decreased stride length, Antalgic, Decreased stance time - left Gait velocity: decr     General Gait Details: Pt does not use AD at baseline and wanted to attempt without.  Did HHA and use of sink when next to sink.  Antalgic and slow pattern.  Continue to recommend RW at this time.   Stairs             Wheelchair Mobility    Modified Rankin (Stroke Patients Only)       Balance Overall balance assessment: Needs assistance Sitting-balance support: No upper extremity supported, Feet unsupported, Feet supported Sitting balance-Leahy Scale: Good     Standing balance support: No upper extremity supported, Single extremity supported Standing balance-Leahy Scale: Fair Standing balance comment: Able to static stand without support                            Cognition Arousal/Alertness: Awake/alert Behavior During Therapy: WFL for tasks assessed/performed Overall Cognitive Status: Within Functional Limits for tasks assessed  Exercises General Exercises - Lower Extremity Ankle Circles/Pumps: AROM, Both, 10 reps, Seated Long Arc Quad: AROM, Both, 10 reps, Seated (slowly, target for full ROM) Hip Flexion/Marching: AROM, Both, 10 reps, Seated    General Comments General comments (skin integrity, edema, etc.): VSS      Pertinent Vitals/Pain Pain Assessment Pain Assessment: Faces Faces Pain Scale: Hurts little more Pain Location: bil  LE Pain Descriptors / Indicators: Burning Pain Intervention(s): Limited activity within patient's tolerance, Monitored during session    Home Living                          Prior Function            PT Goals (current goals can now be found in the care plan section) Progress towards PT goals: Progressing toward goals    Frequency    Min 2X/week      PT Plan Current plan remains appropriate    Co-evaluation              AM-PAC PT "6 Clicks" Mobility   Outcome Measure  Help needed turning from your back to your side while in a flat bed without using bedrails?: A Little Help needed moving from lying on your back to sitting on the side of a flat bed without using bedrails?: A Little Help needed moving to and from a bed to a chair (including a wheelchair)?: A Little Help needed standing up from a chair using your arms (e.g., wheelchair or bedside chair)?: A Little Help needed to walk in hospital room?: A Little Help needed climbing 3-5 steps with a railing? : Total 6 Click Score: 16    End of Session Equipment Utilized During Treatment: Gait belt Activity Tolerance: Patient limited by fatigue Patient left: in chair;with call bell/phone within reach (wants to be in her w/c; knows to call for assist) Nurse Communication: Mobility status PT Visit Diagnosis: Muscle weakness (generalized) (M62.81)     Time: 9038-3338 PT Time Calculation (min) (ACUTE ONLY): 14 min  Charges:  $Gait Training: 8-22 mins                     Abran Richard, PT Acute Rehab [facility] [phone]    Karlton Lemon 04/27/2022, 2:57 PM

## 2022-04-28 DIAGNOSIS — I130 Hypertensive heart and chronic kidney disease with heart failure and stage 1 through stage 4 chronic kidney disease, or unspecified chronic kidney disease: Secondary | ICD-10-CM | POA: Diagnosis not present

## 2022-04-28 DIAGNOSIS — N1832 Chronic kidney disease, stage 3b: Secondary | ICD-10-CM | POA: Diagnosis not present

## 2022-04-28 DIAGNOSIS — E1165 Type 2 diabetes mellitus with hyperglycemia: Secondary | ICD-10-CM | POA: Diagnosis not present

## 2022-04-28 DIAGNOSIS — I5023 Acute on chronic systolic (congestive) heart failure: Secondary | ICD-10-CM | POA: Diagnosis not present

## 2022-04-28 LAB — GLUCOSE, CAPILLARY
Glucose-Capillary: 172 mg/dL — ABNORMAL HIGH (ref 70–99)
Glucose-Capillary: 230 mg/dL — ABNORMAL HIGH (ref 70–99)
Glucose-Capillary: 242 mg/dL — ABNORMAL HIGH (ref 70–99)
Glucose-Capillary: 203 mg/dL — ABNORMAL HIGH (ref 70–99)

## 2022-04-28 LAB — BASIC METABOLIC PANEL
Anion gap: 12 (ref 5–15)
BUN: 52 mg/dL — ABNORMAL HIGH (ref 8–23)
CO2: 27 mmol/L (ref 22–32)
Calcium: 9.7 mg/dL (ref 8.9–10.3)
Chloride: 96 mmol/L — ABNORMAL LOW (ref 98–111)
Creatinine, Ser: 2.58 mg/dL — ABNORMAL HIGH (ref 0.44–1.00)
GFR, Estimated: 19 mL/min — ABNORMAL LOW (ref >60)
Glucose, Bld: 204 mg/dL — ABNORMAL HIGH (ref 70–99)
Potassium: 3.7 mmol/L (ref 3.5–5.1)
Sodium: 135 mmol/L (ref 135–145)

## 2022-04-28 MED ORDER — METOLAZONE 5 MG PO TABS
5.0000 mg | ORAL_TABLET | Freq: Once | ORAL | Status: AC
  Administered 2022-04-28: 5 mg via ORAL
  Filled 2022-04-28: qty 1

## 2022-04-28 MED ORDER — ACETAZOLAMIDE 250 MG PO TABS
500.0000 mg | ORAL_TABLET | Freq: Once | ORAL | Status: AC
  Administered 2022-04-28: 500 mg via ORAL
  Filled 2022-04-28: qty 2

## 2022-04-28 MED ORDER — INSULIN GLARGINE-YFGN 100 UNIT/ML ~~LOC~~ SOLN
17.0000 [IU] | Freq: Every day | SUBCUTANEOUS | Status: DC
  Administered 2022-04-28: 17 [IU] via SUBCUTANEOUS
  Filled 2022-04-28 (×2): qty 0.17

## 2022-04-28 MED ORDER — POTASSIUM CHLORIDE CRYS ER 20 MEQ PO TBCR
40.0000 meq | EXTENDED_RELEASE_TABLET | Freq: Four times a day (QID) | ORAL | Status: AC
  Administered 2022-04-28 (×2): 40 meq via ORAL
  Filled 2022-04-28 (×2): qty 2

## 2022-04-28 NOTE — Progress Notes (Signed)
Occupational Therapy Treatment Patient Details Name: Alexandra Baker MRN: [id number] DOB: Apr 24, 1951 Today's Date: 04/28/2022   History of present illness Patient reports falling three months ago and hurt her leg. Had cellulitis and abscess in leg that got drained and was also treated with antibiotics. Re-hospitalized 8/3-8/14/23 for persistent cellulitis of left lower extremity and found to have slurred speech and reported left facial droop. 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 Pt was admitted to [place] on 04/18/22 from [facility] presenting with SOB with dx: acute exacerbation CHF. PMH includes but is not limited to: DM2, osteoporosis, cellulitis LLE, coronary disease, GERD, HTN & memory loss.   OT comments  Patient seated up in wheelchair upon arrival asking to use BSC. Patient was min assist to stand and min guard to transfer to [facility] with HHA and cues for safety. Patient required assistance to complete toilet hygiene while standing. Patient stood at sink to perform grooming with supervision with one extremity to no UE support. Patient continues to make gains with transfers and self care.  Discharge recommendations continue to be appropriate.    Recommendations for follow up therapy are one component of a multi-disciplinary discharge planning process, led by the attending physician.  Recommendations may be updated based on patient status, additional functional criteria and insurance authorization.    Follow Up Recommendations  Skilled nursing-short term rehab (<3 hours/day)    Assistance Recommended at Discharge Intermittent Supervision/Assistance  Patient can return home with the following  A little help with walking and/or transfers;A little help with bathing/dressing/bathroom;Assistance with cooking/housework;Help with stairs or ramp for entrance   Equipment Recommendations  Other (comment) (defer to next venue)    Recommendations for Other Services       Precautions / Restrictions Precautions Precautions: Fall Restrictions Weight Bearing Restrictions: No       Mobility Bed Mobility Overal bed mobility: Needs Assistance             General bed mobility comments: in chair    Transfers Overall transfer level: Needs assistance Equipment used: 1 person hand held assist Transfers: Sit to/from Stand, Bed to chair/wheelchair/BSC Sit to Stand: Min assist Stand pivot transfers: Min guard         General transfer comment: min assist to stand and min guard for transfers with increased time and cues for safety     Balance Overall balance assessment: Needs assistance Sitting-balance support: No upper extremity supported, Feet unsupported, Feet supported Sitting balance-Leahy Scale: Good     Standing balance support: No upper extremity supported, Single extremity supported Standing balance-Leahy Scale: Fair Standing balance comment: Able to static stand without support                           ADL either performed or assessed with clinical judgement   ADL Overall ADL's : Needs assistance/impaired     Grooming: Wash/dry hands;Wash/dry face;Oral care;Standing;Min guard Grooming Details (indicate cue type and reason): standing at sink                 Toilet Transfer: Min Patent examiner Details (indicate cue type and reason): transferred to 3n1 commode with hand held assist Toileting- Clothing Manipulation and Hygiene: Minimal assistance;Sitting/lateral lean;Sit to/from stand Toileting - Clothing Manipulation Details (indicate cue type and reason): required assistance to complete toilet hygiene       General ADL Comments: difficulty performing toilet hygiene    Extremity/Trunk Assessment  Vision   Additional Comments: cataracts   Perception     Praxis      Cognition Arousal/Alertness: Awake/alert Behavior During Therapy: WFL for tasks  assessed/performed Overall Cognitive Status: Within Functional Limits for tasks assessed                                          Exercises      Shoulder Instructions       General Comments      Pertinent Vitals/ Pain       Pain Assessment Pain Assessment: Faces Faces Pain Scale: Hurts little more Pain Location: bil LE Pain Descriptors / Indicators: Aching, Burning, Grimacing Pain Intervention(s): Limited activity within patient's tolerance, Monitored during session, Repositioned  Home Living                                          Prior Functioning/Environment              Frequency  Min 2X/week        Progress Toward Goals  OT Goals(current goals can now be found in the care plan section)  Progress towards OT goals: Progressing toward goals  Acute Rehab OT Goals Patient Stated Goal: get better OT Goal Formulation: With patient Time For Goal Achievement: 05/03/22 Potential to Achieve Goals: Fair ADL Goals Pt Will Perform Grooming: with modified independence;standing;sitting Pt Will Perform Lower Body Dressing: with modified independence;sitting/lateral leans;sit to/from stand;with adaptive equipment Pt Will Transfer to Toilet: with modified independence;ambulating;bedside commode;regular height toilet Pt Will Perform Toileting - Clothing Manipulation and hygiene: with modified independence;sitting/lateral leans;sit to/from stand Additional ADL Goal #1: Pt will implement energy conservation techniques following written and verbal instruction for increased independence with ADL's  Plan Discharge plan remains appropriate    Co-evaluation                 AM-PAC OT "6 Clicks" Daily Activity     Outcome Measure   Help from another person eating meals?: None Help from another person taking care of personal grooming?: A Little Help from another person toileting, which includes using toliet, bedpan, or urinal?: A  Little Help from another person bathing (including washing, rinsing, drying)?: A Little Help from another person to put on and taking off regular upper body clothing?: None Help from another person to put on and taking off regular lower body clothing?: A Little 6 Click Score: 20    End of Session    OT Visit Diagnosis: Other abnormalities of gait and mobility (R26.89);Pain   Activity Tolerance Patient tolerated treatment well   Patient Left in chair;with call bell/phone within reach   Nurse Communication Mobility status        Time: 7829-5621 OT Time Calculation (min): 25 min  Charges: OT General Charges $OT Visit: 1 Visit OT Treatments $Self Care/Home Management : 23-37 mins  Lodema Hong, [place]  Office [place] 04/28/2022, 9:50 AM

## 2022-04-28 NOTE — TOC Progression Note (Signed)
Transition of Care [facility]) - Progression Note    Patient Details  Name: Alexandra Baker MRN: [id number] Date of Birth: 05-09-1951  Transition of Care [facility]) CM/SW [place], [place] Phone Number: 04/28/2022, 1:57 PM  Clinical Narrative:     CSW submitted SNF auth request on [employer] portal; auth status pending.  Expected Discharge Plan: [place] Barriers to Discharge: Continued Medical Work up  Expected Discharge Plan and Services Expected Discharge Plan: [place] arrangements for the past 2 months: [place]                                       Social Determinants of Health (SDOH) Interventions    Readmission Risk Interventions     No data to display

## 2022-04-28 NOTE — Progress Notes (Signed)
   04/28/22 1100  Mobility  Activity Ambulated with assistance in room  Level of Assistance Contact guard assist, steadying assist  Assistive Device Front wheel walker  Distance Ambulated (ft) 15 ft  Activity Response Tolerated well  $Mobility charge 1 Mobility   Mobility Specialist Progress Note  Pt was in chair and agreeable. Mobility cut short d/t foot pain. Returned to chair w/ all needs met and call bell in reach.    Lucious Groves Mobility Specialist

## 2022-04-28 NOTE — Progress Notes (Signed)
Subjective:   Hospital day 10.  Interval events: None.  Patient reports a good night of sleep and is happy with continued diuresis, feeling good this morning. Discussed plans for intravenous diuretics and monitoring with labs. She denies shortness of breath and has been ambulating to use the bathroom. Dry weight is about 162lbs, currently 174lbs.  Objective:  Vital signs: Blood pressure (!) 130/59, pulse 63, temperature 97.9 F (36.6 C), temperature source Oral, resp. rate 15, height 4' 9.5" (1.461 m), weight 79.3 kg, SpO2 95 %.  Physical exam: Constitutional: Laying in bed.  No distress. Cardiovascular: Regular rate and rhythm.   Pulmonary: Normal work of breathing.  Anterior lung fields clear. Abdominal: Soft.  Nontender. Skin: Warm and dry. Extremities: Pitting edema past the knees.  Bilateral Unna boots.  Good capillary refill bilateral toes. Neuro: Alert. Psych: Normal mood and affect.   Intake/Output Summary (Last 24 hours) at 04/28/2022 0726 Last data filed at 04/28/2022 0331 Gross per 24 hour  Intake 840 ml  Output 2402 ml  Net -1562 ml    Pertinent Labs:    Latest Ref Rng & Units 04/19/2022    3:36 AM 04/18/2022   10:17 AM 04/12/2022    2:40 PM  CBC  WBC 4.0 - 10.5 K/uL 6.9  7.2  7.3   Hemoglobin 12.0 - 15.0 g/dL 9.1  9.8  9.4   Hematocrit 36.0 - 46.0 % 27.9  30.2  29.1   Platelets 150 - 400 K/uL 145  165  240        Latest Ref Rng & Units 04/28/2022   12:35 AM 04/27/2022   12:55 AM 04/26/2022   12:42 AM  CMP  Glucose 70 - 99 mg/dL 204  172  150   BUN 8 - 23 mg/dL 52  49  46   Creatinine 0.44 - 1.00 mg/dL 2.58  2.44  2.34   Sodium 135 - 145 mmol/L 135  135  133   Potassium 3.5 - 5.1 mmol/L 3.7  4.0  3.5   Chloride 98 - 111 mmol/L 96  96  96   CO2 22 - 32 mmol/L 27  26  25    Calcium 8.9 - 10.3 mg/dL 9.7  9.4  9.2    Assessment/Plan:   Principal Problem:   Acute exacerbation of congestive heart failure (HCC) Active Problems:    Benign essential hypertension   CAD (coronary artery disease) of bypass graft   Uncontrolled type 2 diabetes mellitus with hyperglycemia ([place])   Morbid obesity with BMI of 40.0-44.9, adult (HCC)   Stage 3b chronic kidney disease (HCC)   OSA on CPAP   Patient Summary: Alexandra Baker is a 71 y.o. with a PMH of HFmrEF, CKD 3, ASCVD, type 2 diabetes, who presented with several weeks of progressive shortness of breath, weight gain, and edema and was admitted for heart failure exacerbation likely due to ineffective diuresis in the setting of worsening chronic kidney disease, currently receiving IV diuresis to good effect.   Decompensated heart failure Heart failure with moderately reduced ejection fraction Weight down another 6 pounds.  Remains hypervolemic on exam.  Electrolytes and kidney function stable.  Continue IV diuresis today.  Will administer potassium in anticipation of potassium wasting with diuresis. - Continue metolazone 5 mg p.o. daily - Continue acetazolamide 500 mg p.o. daily - Continue furosemide 80 mg IV 3 times daily - Continue carvedilol 6.25  mg twice daily - Continue dapagliflozin 10 mg daily - Continue isosorbide mononitrate 30 mg daily - Continue hydralazine 10 mg every 8 hours - Calcium chloride 40 mEq x 2  Type 2 diabetes Sugars drifting upwards, around 200. - Increase Semglee to 17 units daily - Change correctional dose insulin from sensitive to moderate correction coverage  AKI on CKD 3 versus progression of CKD 4 In the setting of decompensated heart failure and decreased renal perfusion.  Creatinine stable.  Time will tell. - Continue holding Entresto for now until we peel off some diuretics   Left lower extremity venous stasis wound Bilateral Unna boots applied.  Both extremities are warm dry and well-perfused.  - Obtain ABIs - May replace Unna boots after getting ABIs   ASCVD History of coronary artery disease and CVA. - Continue Plavix 75 mg  daily - Continue rosuvastatin 40 mg daily  Diet: Carb-Modified IVF: None VTE: Enoxaparin Code: Full PT/OT recs: SNF for Subacute PT, walker. TOC recs: Will pursue insurance authorization for SNF closer to discharge date.   Dispo: Anticipated discharge to Skilled nursing facility in 2 days pending treatment of heart failure exacerbation.  Nani Gasser MD 04/28/2022, 7:26 AM  Pager: [phone] After 5pm on weekdays and 1pm on weekends: On Call pager: [phone]

## 2022-04-29 ENCOUNTER — Inpatient Hospital Stay (HOSPITAL_COMMUNITY): Payer: Medicare Other

## 2022-04-29 DIAGNOSIS — S81802A Unspecified open wound, left lower leg, initial encounter: Secondary | ICD-10-CM

## 2022-04-29 DIAGNOSIS — I998 Other disorder of circulatory system: Secondary | ICD-10-CM

## 2022-04-29 DIAGNOSIS — N1832 Chronic kidney disease, stage 3b: Secondary | ICD-10-CM | POA: Diagnosis not present

## 2022-04-29 DIAGNOSIS — E1165 Type 2 diabetes mellitus with hyperglycemia: Secondary | ICD-10-CM | POA: Diagnosis not present

## 2022-04-29 DIAGNOSIS — I5023 Acute on chronic systolic (congestive) heart failure: Secondary | ICD-10-CM | POA: Diagnosis not present

## 2022-04-29 DIAGNOSIS — S81802D Unspecified open wound, left lower leg, subsequent encounter: Secondary | ICD-10-CM | POA: Diagnosis not present

## 2022-04-29 DIAGNOSIS — I739 Peripheral vascular disease, unspecified: Secondary | ICD-10-CM | POA: Diagnosis present

## 2022-04-29 LAB — GLUCOSE, CAPILLARY
Glucose-Capillary: 166 mg/dL — ABNORMAL HIGH (ref 70–99)
Glucose-Capillary: 279 mg/dL — ABNORMAL HIGH (ref 70–99)
Glucose-Capillary: 242 mg/dL — ABNORMAL HIGH (ref 70–99)
Glucose-Capillary: 214 mg/dL — ABNORMAL HIGH (ref 70–99)

## 2022-04-29 LAB — BASIC METABOLIC PANEL
Anion gap: 13 (ref 5–15)
BUN: 55 mg/dL — ABNORMAL HIGH (ref 8–23)
CO2: 26 mmol/L (ref 22–32)
Calcium: 9.1 mg/dL (ref 8.9–10.3)
Chloride: 92 mmol/L — ABNORMAL LOW (ref 98–111)
Creatinine, Ser: 2.56 mg/dL — ABNORMAL HIGH (ref 0.44–1.00)
GFR, Estimated: 20 mL/min — ABNORMAL LOW (ref >60)
Glucose, Bld: 184 mg/dL — ABNORMAL HIGH (ref 70–99)
Potassium: 3.2 mmol/L — ABNORMAL LOW (ref 3.5–5.1)
Sodium: 131 mmol/L — ABNORMAL LOW (ref 135–145)

## 2022-04-29 LAB — MAGNESIUM: Magnesium: 2 mg/dL (ref 1.7–2.4)

## 2022-04-29 MED ORDER — INSULIN ASPART 100 UNIT/ML IJ SOLN
4.0000 [IU] | Freq: Three times a day (TID) | INTRAMUSCULAR | Status: DC
  Administered 2022-04-29 – 2022-05-03 (×7): 4 [IU] via SUBCUTANEOUS

## 2022-04-29 MED ORDER — POTASSIUM CHLORIDE CRYS ER 20 MEQ PO TBCR
40.0000 meq | EXTENDED_RELEASE_TABLET | Freq: Four times a day (QID) | ORAL | Status: DC
  Filled 2022-04-29: qty 2

## 2022-04-29 MED ORDER — INSULIN GLARGINE-YFGN 100 UNIT/ML ~~LOC~~ SOLN
19.0000 [IU] | Freq: Every day | SUBCUTANEOUS | Status: DC
  Filled 2022-04-29: qty 0.19

## 2022-04-29 MED ORDER — ACETAZOLAMIDE 250 MG PO TABS
500.0000 mg | ORAL_TABLET | Freq: Once | ORAL | Status: AC
  Administered 2022-04-29: 500 mg via ORAL
  Filled 2022-04-29: qty 2

## 2022-04-29 MED ORDER — INSULIN GLARGINE-YFGN 100 UNIT/ML ~~LOC~~ SOLN
17.0000 [IU] | Freq: Every day | SUBCUTANEOUS | Status: DC
  Administered 2022-04-29 – 2022-05-02 (×4): 17 [IU] via SUBCUTANEOUS
  Filled 2022-04-29 (×5): qty 0.17

## 2022-04-29 MED ORDER — POTASSIUM CHLORIDE CRYS ER 20 MEQ PO TBCR
40.0000 meq | EXTENDED_RELEASE_TABLET | Freq: Four times a day (QID) | ORAL | Status: AC
  Administered 2022-04-29 (×2): 40 meq via ORAL
  Filled 2022-04-29 (×2): qty 2

## 2022-04-29 MED ORDER — METOLAZONE 5 MG PO TABS
5.0000 mg | ORAL_TABLET | Freq: Once | ORAL | Status: AC
  Administered 2022-04-29: 5 mg via ORAL
  Filled 2022-04-29: qty 1

## 2022-04-29 NOTE — Progress Notes (Signed)
Orthopedic Tech Progress Note Patient Details:  Alexandra Baker January 10, 1951 [id number]  Ortho Devices Type of Ortho Device: Haematologist Ortho Device/Splint Location: BLE Ortho Device/Splint Interventions: Ordered, Application   Post Interventions Patient Tolerated: Well  Alexandra Baker 04/29/2022, 3:35 PM

## 2022-04-29 NOTE — Progress Notes (Signed)
Subjective:   Hospital day 11.  Interval events: No interval events.  Upset because she was not assisted to the bathroom in a timely fashion, causing urinary incontinence.  No new or worsening shortness of breath.  Left leg is still somewhat painful from her chronic wound.  Objective:  Vital signs: Blood pressure 120/61, pulse 61, temperature 97.7 F (36.5 C), temperature source Oral, resp. rate 20, height 4' 9.5" (1.461 m), weight 77.6 kg, SpO2 98 %.  Physical exam: Constitutional: Laying in bed.  No distress. Cardiovascular: Regular rate and rhythm.   Pulmonary: Normal work of breathing.  Anterior lung fields clear. Abdominal: Soft.  Nontender. Skin: Warm and dry. Extremities: Pitting edema past the knees.  Wound with eschar lateral left lower leg.  Good capillary refill bilateral toes. Neuro: Alert. Psych: Normal mood and affect.   Intake/Output Summary (Last 24 hours) at 04/29/2022 1256 Last data filed at 04/29/2022 0407 Gross per 24 hour  Intake 120 ml  Output 1450 ml  Net -1330 ml    Pertinent Labs:    Latest Ref Rng & Units 04/19/2022    3:36 AM 04/18/2022   10:17 AM 04/12/2022    2:40 PM  CBC  WBC 4.0 - 10.5 K/uL 6.9  7.2  7.3   Hemoglobin 12.0 - 15.0 g/dL 9.1  9.8  9.4   Hematocrit 36.0 - 46.0 % 27.9  30.2  29.1   Platelets 150 - 400 K/uL 145  165  240        Latest Ref Rng & Units 04/29/2022   12:25 AM 04/28/2022   12:35 AM 04/27/2022   12:55 AM  CMP  Glucose 70 - 99 mg/dL 184  204  172   BUN 8 - 23 mg/dL 55  52  49   Creatinine 0.44 - 1.00 mg/dL 2.56  2.58  2.44   Sodium 135 - 145 mmol/L 131  135  135   Potassium 3.5 - 5.1 mmol/L 3.2  3.7  4.0   Chloride 98 - 111 mmol/L 92  96  96   CO2 22 - 32 mmol/L 26  27  26    Calcium 8.9 - 10.3 mg/dL 9.1  9.7  9.4    Magnesium: 2.0  Imaging: ABIs with moderate bilateral lower extremity peripheral artery disease  Assessment/Plan:   Principal Problem:   Acute exacerbation of congestive  heart failure (HCC) Active Problems:   Benign essential hypertension   CAD (coronary artery disease) of bypass graft   Uncontrolled type 2 diabetes mellitus with hyperglycemia (HCC)   Morbid obesity with BMI of 40.0-44.9, adult (HCC)   Stage 3b chronic kidney disease (HCC)   OSA on CPAP   Patient Summary: Alexandra Baker is a 71 y.o. with a PMH of HFmrEF, CKD 3, ASCVD, type 2 diabetes, who presented with several weeks of progressive shortness of breath, weight gain, and edema and was admitted for heart failure exacerbation likely due to ineffective diuresis in the setting of worsening chronic kidney disease, currently receiving IV diuresis to good effect.   Decompensated heart failure Heart failure with moderately reduced ejection fraction Weight down another 1.7 kg.  Remains hypervolemic on exam.  Mild hyponatremia and hypokalemia today.  Continue IV diuresis today.  Will administer potassium in anticipation of potassium wasting with diuresis. - Continue metolazone 5 mg p.o. daily - Continue acetazolamide 500 mg p.o. daily - Continue furosemide 80 mg  IV 3 times daily - Continue carvedilol 6.25 mg twice daily - Continue dapagliflozin 10 mg daily - Continue isosorbide mononitrate 30 mg daily - Continue hydralazine 10 mg every 8 hours - Potassium chloride 40 mEq x 3  Type 2 diabetes Sugars drifting upwards, around 200.  Early morning sugars are good, seems postprandial sugars are the issue.  At home the patient is on glargine 14 units nightly and lispro 12 units 3 times daily with meals. - Start 4 units NovoLog 3 times daily with meals - Continue glargine 17 units daily - Correctional dose insulin   AKI on CKD 3 versus progression of CKD 4 In the setting of decompensated heart failure and decreased renal perfusion.  Creatinine stable.  Time will tell. - Continue holding Entresto for now until we peel off some diuretics   Left lower extremity venous stasis wound Mild bilateral  lower extremity peripheral arterial disease Extremities warm dry well-perfused.  ABIs showed slightly decreased indices suggestive of mild bilateral lower extremity peripheral arterial disease.  DVT study on admission was negative.  Left lower extremity wound most likely due to venous stasis given findings of lower extremity edema and location of the wound.   ASCVD History of coronary artery disease and CVA. - Continue Plavix 75 mg daily - Continue rosuvastatin 40 mg daily   Diet: Carb-Modified IVF: None VTE: Enoxaparin Code: Full PT/OT recs: SNF for Subacute PT, walker. TOC recs: SNF auth request pending   Dispo: Anticipated discharge to Skilled nursing facility in 2 days pending treatment of heart failure exacerbation.  Nani Gasser MD 04/29/2022, 12:56 PM  Pager: [phone] After 5pm on weekdays and 1pm on weekends: On Call pager: [phone]

## 2022-04-29 NOTE — Progress Notes (Signed)
Mobility Specialist Progress Note    04/29/22 1103  Mobility  Activity Ambulated with assistance in room  Level of Assistance Minimal assist, patient does 75% or more  Assistive Device Four wheel walker  Distance Ambulated (ft) 20 ft  Activity Response Tolerated well  $Mobility charge 1 Mobility   Pre-Mobility: 64 HR Post-Mobility: 66 HR  Pt received in chair and agreeable. No complaints on walk. Returned to chair with call bell in reach.    Hildred Alamin Mobility Specialist

## 2022-04-29 NOTE — Progress Notes (Signed)
ABI's have been completed. Preliminary results can be found in CV Proc through chart review.   04/29/22 11:45 AM Alexandra Baker RVT

## 2022-04-29 NOTE — TOC Progression Note (Addendum)
Transition of Care [facility]) - Progression Note    Patient Details  Name: SHELVA HETZER MRN: [id number] Date of Birth: [birth year]  Transition of Care [facility]) CM/SW [place], [place] Phone Number: 04/29/2022, 2:52 PM  Clinical Narrative:     Josem Kaufmann approved 04/29/2022-05/03/2022 Ref# [id number]  [facility] SN[facility] and informed that Josem Kaufmann is approved. Inquired about weekend DC and informed that they do not do weekend admissions and earliest admission would be Monday. Pt came from [facility] so really they should be able to have her return over the weekend though it is unclear if she will discharge; TOC could explore this option further when DC date clarified.   Expected Discharge Plan: [place] Barriers to Discharge: Continued Medical Work up  Expected Discharge Plan and Services Expected Discharge Plan: [place] arrangements for the past 2 months: [place]                                       Social Determinants of Health (SDOH) Interventions    Readmission Risk Interventions     No data to display

## 2022-04-30 LAB — BASIC METABOLIC PANEL
Anion gap: 14 (ref 5–15)
BUN: 59 mg/dL — ABNORMAL HIGH (ref 8–23)
CO2: 28 mmol/L (ref 22–32)
Calcium: 9.4 mg/dL (ref 8.9–10.3)
Chloride: 93 mmol/L — ABNORMAL LOW (ref 98–111)
Creatinine, Ser: 2.77 mg/dL — ABNORMAL HIGH (ref 0.44–1.00)
GFR, Estimated: 18 mL/min — ABNORMAL LOW (ref >60)
Glucose, Bld: 205 mg/dL — ABNORMAL HIGH (ref 70–99)
Potassium: 3 mmol/L — ABNORMAL LOW (ref 3.5–5.1)
Sodium: 135 mmol/L (ref 135–145)

## 2022-04-30 LAB — GLUCOSE, CAPILLARY
Glucose-Capillary: 171 mg/dL — ABNORMAL HIGH (ref 70–99)
Glucose-Capillary: 222 mg/dL — ABNORMAL HIGH (ref 70–99)
Glucose-Capillary: 125 mg/dL — ABNORMAL HIGH (ref 70–99)
Glucose-Capillary: 162 mg/dL — ABNORMAL HIGH (ref 70–99)

## 2022-04-30 LAB — MAGNESIUM: Magnesium: 2 mg/dL (ref 1.7–2.4)

## 2022-04-30 MED ORDER — ACETAZOLAMIDE 250 MG PO TABS
500.0000 mg | ORAL_TABLET | Freq: Once | ORAL | Status: AC
  Administered 2022-04-30: 500 mg via ORAL
  Filled 2022-04-30: qty 2

## 2022-04-30 MED ORDER — POTASSIUM CHLORIDE CRYS ER 20 MEQ PO TBCR
40.0000 meq | EXTENDED_RELEASE_TABLET | Freq: Four times a day (QID) | ORAL | Status: AC
  Administered 2022-04-30 (×3): 40 meq via ORAL
  Filled 2022-04-30 (×3): qty 2

## 2022-04-30 MED ORDER — METOLAZONE 5 MG PO TABS
5.0000 mg | ORAL_TABLET | Freq: Once | ORAL | Status: AC
  Administered 2022-04-30: 5 mg via ORAL
  Filled 2022-04-30: qty 1

## 2022-04-30 MED ORDER — HYDROCODONE-ACETAMINOPHEN 5-325 MG PO TABS
1.0000 | ORAL_TABLET | Freq: Two times a day (BID) | ORAL | Status: DC | PRN
  Administered 2022-04-30 – 2022-05-03 (×7): 1 via ORAL
  Filled 2022-04-30 (×9): qty 1

## 2022-04-30 NOTE — Progress Notes (Signed)
Subjective:   Hospital day 12.  Interval events: None.  Enjoying breakfast upon our arrival for interview and assessment.  Itching left leg at the top of Unna boot.  Left leg soreness.  Bilateral legs feel heavy.  No new or worsening breathing difficulty.  No chest pain.  Overall, feels well.  Objective:  Vital signs: Blood pressure 120/64, pulse 60, temperature (!) 97 F (36.1 C), temperature source Oral, resp. rate (!) 21, height 4' 9.5" (1.461 m), weight 70 kg, SpO2 99 %.  Physical exam: Constitutional: Sitting upright in wheelchair.  Eating breakfast. Cardiovascular: Regular rate and rhythm.  Right radial pulse 2+.  Mild JVD while sitting upright. Pulmonary: Clear to auscultation bilaterally without crackles. Skin: Warm and dry. Extremities: Bilateral lower extremity edema to hips.  Bilateral Unna boots in place.  Normal capillary refill bilateral feet. Neuro: Alert. Psych: Pleasant.  Appropriate mood and affect.  Weight change: -7.611 kg   Intake/Output Summary (Last 24 hours) at 04/30/2022 0842 Last data filed at 04/30/2022 0829 Gross per 24 hour  Intake 720 ml  Output 1651 ml  Net -931 ml    Pertinent Labs:    Latest Ref Rng & Units 04/19/2022    3:36 AM 04/18/2022   10:17 AM 04/12/2022    2:40 PM  CBC  WBC 4.0 - 10.5 K/uL 6.9  7.2  7.3   Hemoglobin 12.0 - 15.0 g/dL 9.1  9.8  9.4   Hematocrit 36.0 - 46.0 % 27.9  30.2  29.1   Platelets 150 - 400 K/uL 145  165  240        Latest Ref Rng & Units 04/30/2022    1:02 AM 04/29/2022   12:25 AM 04/28/2022   12:35 AM  CMP  Glucose 70 - 99 mg/dL 205  184  204   BUN 8 - 23 mg/dL 59  55  52   Creatinine 0.44 - 1.00 mg/dL 2.77  2.56  2.58   Sodium 135 - 145 mmol/L 135  131  135   Potassium 3.5 - 5.1 mmol/L 3.0  3.2  3.7   Chloride 98 - 111 mmol/L 93  92  96   CO2 22 - 32 mmol/L 28  26  27    Calcium 8.9 - 10.3 mg/dL 9.4  9.1  9.7    Magnesium: 2  Assessment/Plan:   Principal Problem:   Acute  exacerbation of congestive heart failure (HCC) Active Problems:   Benign essential hypertension   CAD (coronary artery disease) of bypass graft   Uncontrolled type 2 diabetes mellitus with hyperglycemia (HCC)   Morbid obesity (HCC)   Stage 3b chronic kidney disease (HCC)   OSA on CPAP   PAD (peripheral artery disease) (HCC)   Wound of left leg   Patient Summary: Alexandra Baker is a 71 y.o. with a PMH of HFmrEF, CKD 3, ASCVD, type 2 diabetes, who presented with several weeks of progressive shortness of breath, weight gain, and edema and was admitted for heart failure exacerbation likely due to ineffective diuresis in the setting of worsening chronic kidney disease, currently receiving IV diuresis to good effect.  Decompensated heart failure, improving Heart failure with moderately reduced ejection fraction Likely due to under diuresis secondary to progression of CKD.  Remains hypervolemic on physical exam.  Continues to tolerate IV diuresis well.  Mild hypokalemia today.  Slowly uptrending BUN and creatinine.  Replace potassium  and continue IV diuresis. - Continue metolazone 5 mg p.o. daily - Continue acetazolamide 500 mg p.o. daily - Continue furosemide 80 mg IV 3 times daily - Continue carvedilol 6.25 mg twice daily - Continue dapagliflozin 10 mg daily - Continue isosorbide mononitrate 30 mg daily - Continue hydralazine 10 mg every 8 hours - Potassium chloride 40 mEq x 3  Type 2 diabetes, uncontrolled with hyperglycemia Postprandial hyperglycemia.  Restarted 3 times daily aspart yesterday.  Aiming for sugars around 180 or less. - Continue 4 units NovoLog 3 times daily with meals - Continue glargine 17 units daily - Correctional dose insulin  AKI on CKD 3 versus progression of CKD 4 Suspect disease progression to CKD IV prior to admission. Creatinine trending upwards in setting of aggressive IV diuresis.  - Continue holding Entresto for now until we peel off some  diuretics  Left lower extremity venous stasis wound Mild bilateral lower extremity peripheral arterial disease Extremities warm dry well-perfused.  ABIs showed slightly decreased indices suggestive of mild bilateral lower extremity peripheral arterial disease.  No DVT per [place] on admission. Will reapply Unna boots as they seemed to help with edema. - Bilateral Unna boots change twice weekly  ASCVD History of coronary artery disease and CVA. - Continue Plavix 75 mg daily - Continue rosuvastatin 40 mg daily  Diet: Carb-Modified IVF: None VTE: Enoxaparin Code: Full PT/OT recs: SNF for Subacute PT, walker. TOC recs: SNF auth request pending   Dispo: Anticipated discharge to Skilled nursing facility in 2 days pending treatment of heart failure exacerbation and authorization for SNF.  Nani Gasser MD 04/30/2022, 8:42 AM  Pager: [phone] After 5pm on weekdays and 1pm on weekends: On Call pager: [phone]

## 2022-04-30 NOTE — Progress Notes (Signed)
   04/30/22 1000  Mobility  Activity Ambulated independently in room  Level of Assistance Contact guard assist, steadying assist  Assistive Device Front wheel walker  Distance Ambulated (ft) 5 ft  Activity Response Tolerated fair  $Mobility charge 1 Mobility   Mobility Specialist Progress Note  Pt was in chair and agreeable. Mobility cut short d/t c/o foot pain. Returned to chair w/ all needs met and call bell in reach.  Alexandra Baker Mobility Specialist

## 2022-05-01 DIAGNOSIS — I5023 Acute on chronic systolic (congestive) heart failure: Secondary | ICD-10-CM | POA: Diagnosis not present

## 2022-05-01 LAB — BASIC METABOLIC PANEL
Anion gap: 15 (ref 5–15)
BUN: 61 mg/dL — ABNORMAL HIGH (ref 8–23)
CO2: 27 mmol/L (ref 22–32)
Calcium: 9.4 mg/dL (ref 8.9–10.3)
Chloride: 92 mmol/L — ABNORMAL LOW (ref 98–111)
Creatinine, Ser: 2.91 mg/dL — ABNORMAL HIGH (ref 0.44–1.00)
GFR, Estimated: 17 mL/min — ABNORMAL LOW (ref >60)
Glucose, Bld: 146 mg/dL — ABNORMAL HIGH (ref 70–99)
Potassium: 4.6 mmol/L (ref 3.5–5.1)
Sodium: 134 mmol/L — ABNORMAL LOW (ref 135–145)

## 2022-05-01 LAB — GLUCOSE, CAPILLARY
Glucose-Capillary: 155 mg/dL — ABNORMAL HIGH (ref 70–99)
Glucose-Capillary: 198 mg/dL — ABNORMAL HIGH (ref 70–99)
Glucose-Capillary: 91 mg/dL (ref 70–99)
Glucose-Capillary: 148 mg/dL — ABNORMAL HIGH (ref 70–99)

## 2022-05-01 LAB — MAGNESIUM: Magnesium: 2 mg/dL (ref 1.7–2.4)

## 2022-05-01 MED ORDER — TORSEMIDE 20 MG PO TABS
80.0000 mg | ORAL_TABLET | Freq: Two times a day (BID) | ORAL | Status: DC
  Administered 2022-05-01: 80 mg via ORAL
  Filled 2022-05-01: qty 4

## 2022-05-01 NOTE — TOC Progression Note (Signed)
Transition of Care [facility]) - Progression Note    Patient Details  Name: Alexandra Baker MRN: [id number] Date of Birth: 1951-02-03  Transition of Care [facility]) CM/SW [place], [place] Phone Number: 05/01/2022, 12:06 PM  Clinical Narrative:     CSW to follow up with [facility] on Monday to confirm return if medically ready for dc. Patients insurance authorization has been approved. CSW will continue to follow and assist with patients dc planning needs.  Expected Discharge Plan: [place] Barriers to Discharge: Continued Medical Work up  Expected Discharge Plan and Services Expected Discharge Plan: [place] arrangements for the past 2 months: [place]                                       Social Determinants of Health (SDOH) Interventions    Readmission Risk Interventions     No data to display

## 2022-05-01 NOTE — Progress Notes (Signed)
   05/01/22 1042  Mobility  Activity Transferred to/from [facility]  Level of Assistance Contact guard assist, steadying assist  Assistive Device None  Distance Ambulated (ft) 5 ft  Activity Response Tolerated well  $Mobility charge 1 Mobility   Mobility Specialist Progress Note  Received pt in chair having no complaints and agreeable to transfer. Mobility cut short d/t c/o leg pain. Left in chair w/ call bell in reach and all needs met.   Lucious Groves Mobility Specialist

## 2022-05-01 NOTE — Progress Notes (Addendum)
Subjective:   Hospital day 13.  Interval events: No interval events.  Continues to report left leg pain.  Pleased with the amount of weight she is lost.  No new shortness of breath.  Objective:  Vital signs: Blood pressure (!) 121/57, pulse (!) 59, temperature 97.9 F (36.6 C), temperature source Oral, resp. rate 15, height 4' 9.5" (1.461 m), weight 75.6 kg, SpO2 96 %.  Physical exam: Constitutional: Sitting upright in wheelchair.  Eating breakfast. Cardiovascular: Regular rate and rhythm.  Right radial pulse 2+.  Mild JVD while sitting upright. Pulmonary: Clear to auscultation bilaterally without crackles. Skin: Warm and dry. Extremities: Bilateral Unna boots in place.  Normal capillary refill bilateral feet. Neuro: Alert. Psych: Pleasant.  Appropriate mood and affect.  Intake/Output Summary (Last 24 hours) at 05/01/2022 1210 Last data filed at 05/01/2022 1028 Gross per 24 hour  Intake 720 ml  Output 2000 ml  Net -1280 ml    Pertinent Labs:    Latest Ref Rng & Units 04/19/2022    3:36 AM 04/18/2022   10:17 AM 04/12/2022    2:40 PM  CBC  WBC 4.0 - 10.5 K/uL 6.9  7.2  7.3   Hemoglobin 12.0 - 15.0 g/dL 9.1  9.8  9.4   Hematocrit 36.0 - 46.0 % 27.9  30.2  29.1   Platelets 150 - 400 K/uL 145  165  240        Latest Ref Rng & Units 05/01/2022    3:12 AM 04/30/2022    1:02 AM 04/29/2022   12:25 AM  CMP  Glucose 70 - 99 mg/dL 146  205  184   BUN 8 - 23 mg/dL 61  59  55   Creatinine 0.44 - 1.00 mg/dL 2.91  2.77  2.56   Sodium 135 - 145 mmol/L 134  135  131   Potassium 3.5 - 5.1 mmol/L 4.6  3.0  3.2   Chloride 98 - 111 mmol/L 92  93  92   CO2 22 - 32 mmol/L 27  28  26    Calcium 8.9 - 10.3 mg/dL 9.4  9.4  9.1    Assessment/Plan:   Principal Problem:   Acute exacerbation of congestive heart failure (HCC) Active Problems:   Benign essential hypertension   CAD (coronary artery disease) of bypass graft   Uncontrolled type 2 diabetes mellitus with  hyperglycemia (HCC)   Morbid obesity (HCC)   Stage 3b chronic kidney disease (HCC)   OSA on CPAP   PAD (peripheral artery disease) (HCC)   Wound of left leg   Patient Summary: Alexandra Baker is a 71 y.o. with a PMH of HFmrEF, CKD 3, ASCVD, type 2 diabetes, admitted for acute decompensated heart failure, received IV diuresis to good effect, now transitioning to oral diuresis in preparation for discharge.  Acute decompensated heart failure, improving Heart failure with moderately reduced ejection fraction, EF 40 to 45% Likely due to under diuresis secondary to progression of CKD.  Lingering signs of hypervolemia on exam.  BUN and creatinine rising.  Due to worsening renal insufficiency, will transition to p.o. diuresis today.  Ensure close follow-up with cardiology on discharge to restart/titrate GDMT in setting of worsening renal function. - Discontinue metolazone, acetazolamide, IV furosemide - Start torsemide 80 mg p.o. twice daily - Continue carvedilol 6.25 mg twice daily - Continue low-flow dapagliflozin 10 mg daily - Continue isosorbide mononitrate 30 mg  daily - Continue hydralazine 10 mg every 8 hours  Type 2 diabetes, uncontrolled with hyperglycemia Postprandial hyperglycemia improving. - Continue 4 units NovoLog 3 times daily with meals in addition to correctional dose insulin - Continue glargine 17 units daily  AKI on CKD 3 versus progression to CKD 4 Ensure close follow-up with nephrology on discharge. - Continue holding Entresto for now   Left lower extremity venous stasis wound Mild bilateral lower extremity peripheral arterial disease Extremities warm, dry, well-perfused.  ABIs showed slightly decreased indices suggestive of mild bilateral lower extremity peripheral arterial disease.  No DVT per [place] on admission. Will reapply Unna boots as they seemed to help with edema. - Bilateral Unna boots change twice weekly   ASCVD History of coronary artery disease and  CVA. - Continue Plavix 75 mg daily - Continue rosuvastatin 40 mg daily  Diet: Carb-Modified IVF: None VTE: Enoxaparin Code: Full PT/OT recs: SNF for Subacute PT, walker. TOC recs: SNF auth request pending   Dispo: Anticipated discharge to Skilled nursing facility in 2 days pending treatment of heart failure exacerbation and authorization for SNF.  Nani Gasser MD 05/01/2022, 12:10 PM  Pager: [phone] After 5pm on weekdays and 1pm on weekends: On Call pager: [phone]

## 2022-05-01 NOTE — Plan of Care (Signed)
  Problem: Clinical Measurements: Goal: Cardiovascular complication will be avoided Outcome: Not Progressing   Problem: Clinical Measurements: Goal: Diagnostic test results will improve Outcome: Not Progressing   Problem: Fluid Volume: Goal: Ability to maintain a balanced intake and output will improve Outcome: Not Progressing

## 2022-05-02 DIAGNOSIS — I5023 Acute on chronic systolic (congestive) heart failure: Secondary | ICD-10-CM | POA: Diagnosis not present

## 2022-05-02 DIAGNOSIS — N1832 Chronic kidney disease, stage 3b: Secondary | ICD-10-CM | POA: Diagnosis not present

## 2022-05-02 DIAGNOSIS — E1165 Type 2 diabetes mellitus with hyperglycemia: Secondary | ICD-10-CM | POA: Diagnosis not present

## 2022-05-02 DIAGNOSIS — N179 Acute kidney failure, unspecified: Secondary | ICD-10-CM | POA: Diagnosis not present

## 2022-05-02 LAB — MAGNESIUM: Magnesium: 2.1 mg/dL (ref 1.7–2.4)

## 2022-05-02 LAB — BASIC METABOLIC PANEL
Anion gap: 15 (ref 5–15)
BUN: 66 mg/dL — ABNORMAL HIGH (ref 8–23)
CO2: 25 mmol/L (ref 22–32)
Calcium: 9.3 mg/dL (ref 8.9–10.3)
Chloride: 92 mmol/L — ABNORMAL LOW (ref 98–111)
Creatinine, Ser: 3.33 mg/dL — ABNORMAL HIGH (ref 0.44–1.00)
GFR, Estimated: 14 mL/min — ABNORMAL LOW (ref >60)
Glucose, Bld: 144 mg/dL — ABNORMAL HIGH (ref 70–99)
Potassium: 3.8 mmol/L (ref 3.5–5.1)
Sodium: 132 mmol/L — ABNORMAL LOW (ref 135–145)

## 2022-05-02 LAB — GLUCOSE, CAPILLARY
Glucose-Capillary: 102 mg/dL — ABNORMAL HIGH (ref 70–99)
Glucose-Capillary: 201 mg/dL — ABNORMAL HIGH (ref 70–99)
Glucose-Capillary: 100 mg/dL — ABNORMAL HIGH (ref 70–99)
Glucose-Capillary: 114 mg/dL — ABNORMAL HIGH (ref 70–99)

## 2022-05-02 MED ORDER — TORSEMIDE 20 MG PO TABS
40.0000 mg | ORAL_TABLET | Freq: Two times a day (BID) | ORAL | Status: AC
  Administered 2022-05-02 (×2): 40 mg via ORAL
  Filled 2022-05-02 (×2): qty 2

## 2022-05-02 MED ORDER — ROSUVASTATIN CALCIUM 5 MG PO TABS
10.0000 mg | ORAL_TABLET | Freq: Every day | ORAL | Status: DC
  Administered 2022-05-03: 10 mg via ORAL
  Filled 2022-05-02: qty 2

## 2022-05-02 NOTE — Discharge Instructions (Addendum)
Alexandra Baker  You were admitted for weight gain, leg swelling, and shortness of breath and treated for acute exacerbation of heart failure.  You were treated with several days of IV diuretic medicine to help your kidneys excrete the excess fluid that had accumulated in your body.  We are discharging you home now that you are doing better. To help assist you on your road to recovery, I have written the following recommendations:   For your heart failure, please continue taking torsemide 40 mg twice daily.  Because of your worsening kidney function, I recommend not taking Entresto until you follow-up with your doctor outside of the hospital.  For your cholesterol, I have reduced the dose of your Crestor from 40 mg daily to 10 mg daily.  This is due to your depressed kidney function.  Please continue taking the rest of your medications as prescribed.  Below are the results of some pertinent labs and studies:  Lab Results  Component Value Date   CREATININE 3.09 (H) 05/03/2022   CREATININE 3.33 (H) 05/02/2022   CREATININE 2.91 (H) 05/01/2022   VAS [place] ABI WITH/WO TBI  Summary: Right: Resting right ankle-brachial index indicates mild right lower extremity arterial disease. The right toe-brachial index is abnormal. Left: Resting left ankle-brachial index indicates noncompressible left lower extremity arteries. The left toe-brachial index is abnormal. Electronically signed by Deitra Mayo MD on 04/29/2022 at 1:31:48 PM.   Follow-up Information     Alma Friendly, MD Follow up on 05/09/2022.   Specialty: Internal Medicine Why: @4 :20pm Contact information: [address] [place] [address] [phone]         Park Liter, MD .   Specialty: Cardiology Contact information: [place] [address] [phone]         Leanor Kail, [place] Follow up on 05/05/2022.   Specialty: Cardiology Why: Please arrive at 1:15 pm for your 1:30  appointment. Contact information: [place] [address] [phone]                It was a privilege to be a part of your hospital care team, and I hope you feel better as a result of your stay.  All the best, Nani Gasser, MD

## 2022-05-02 NOTE — Progress Notes (Signed)
Subjective:   Hospital day 14.  Interval events: None.  Continues to report L leg soreness.  Reports no changes with urination.  No new or worsening shortness of breath.  No flank pain.  Moving well with mobility specialist.  Very disappointed that she will not be discharging home today.  Objective:  Vital signs: Blood pressure 118/68, pulse 61, temperature 98 F (36.7 C), temperature source Oral, resp. rate 17, height 4' 9.5" (1.461 m), weight 67.2 kg, SpO2 98 %.  Physical exam: Constitutional: Sitting upright in wheelchair. Cardiovascular: Cardiovascular: Regular rate and rhythm.  Right radial pulse 2+.  Mild JVD while sitting upright. Pulmonary: Lungs bases without crackles.  Normal work of breathing. Skin: Warm and dry. Extremities: Bilateral Unna boots in place.  Bilateral lower extremity edema. Neuro: Alert.  No gross focal deficits. Psych: Pleasant.  Appropriate mood and affect.   Intake/Output Summary (Last 24 hours) at 05/02/2022 1117 Last data filed at 05/02/2022 0917 Gross per 24 hour  Intake 680 ml  Output 2400 ml  Net -1720 ml    Pertinent Labs:    Latest Ref Rng & Units 04/19/2022    3:36 AM 04/18/2022   10:17 AM 04/12/2022    2:40 PM  CBC  WBC 4.0 - 10.5 K/uL 6.9  7.2  7.3   Hemoglobin 12.0 - 15.0 g/dL 9.1  9.8  9.4   Hematocrit 36.0 - 46.0 % 27.9  30.2  29.1   Platelets 150 - 400 K/uL 145  165  240        Latest Ref Rng & Units 05/02/2022   12:31 AM 05/01/2022    3:12 AM 04/30/2022    1:02 AM  CMP  Glucose 70 - 99 mg/dL 144  146  205   BUN 8 - 23 mg/dL 66  61  59   Creatinine 0.44 - 1.00 mg/dL 3.33  2.91  2.77   Sodium 135 - 145 mmol/L 132  134  135   Potassium 3.5 - 5.1 mmol/L 3.8  4.6  3.0   Chloride 98 - 111 mmol/L 92  92  93   CO2 22 - 32 mmol/L 25  27  28    Calcium 8.9 - 10.3 mg/dL 9.3  9.4  9.4    Magnesium: 2.1.  Assessment/Plan:   Principal Problem:   Acute exacerbation of congestive heart failure (HCC) Active  Problems:   Benign essential hypertension   CAD (coronary artery disease) of bypass graft   Uncontrolled type 2 diabetes mellitus with hyperglycemia (HCC)   Morbid obesity (HCC)   Stage 3b chronic kidney disease (HCC)   OSA on CPAP   PAD (peripheral artery disease) (HCC)   Wound of left leg   Patient Summary: Alexandra Baker is a 71 y.o. with a PMH of HFmrEF, CKD 3, ASCVD, type 2 diabetes, admitted for acute decompensated heart failure, treated with several days of IV diuresis, now on oral regimen.  Has uptrending BUN and creatinine.  Overall, condition continues to improve.  Acute decompensated heart failure, improving Heart failure with moderately reduced ejection fraction, EF 40 to 45% Transition to oral torsemide yesterday.  Good urine output.  Lingering signs of hypervolemia on exam.  BUN and creatinine trending upwards.  Will maintain on oral diuresis today and reassess renal function in preparation for discharge tomorrow.  Continue holding Entresto. - Start torsemide 40 mg p.o. twice daily - Continue carvedilol  6.25 mg twice daily - Continue empagliflozin 10 mg daily - Continue Imdur 30 mg daily - Continue hydralazine 10 mg every 8 hours  Type 2 diabetes, uncontrolled with hyperglycemia - Continue 4 units NovoLog 3 times daily with meals in addition to correctional dose insulin. - Continue glargine 17 units daily.  AKI on CKD 3 versus progression to CKD 4 Ensure close follow-up with nephrology on discharge. - Continue holding Entresto for now   Left lower extremity venous stasis wound Mild bilateral lower extremity peripheral arterial disease Extremities warm, dry, well-perfused.  ABIs showed slightly decreased indices suggestive of mild bilateral lower extremity peripheral arterial disease.  No DVT per [place] on admission. Will reapply Unna boots as they seemed to help with edema. - Bilateral Unna boots change twice weekly   ASCVD History of coronary artery disease and  CVA. - Continue Plavix 75 mg daily - Continue rosuvastatin 40 mg daily   Diet: Carb-Modified IVF: None VTE: Enoxaparin Code: Full PT/OT recs: SNF for Subacute PT, walker. TOC recs: SNF auth request pending   Dispo: Anticipated discharge to Skilled nursing facility in 1 day pending treatment of heart failure exacerbation and authorization for SNF.   Nani Gasser MD 05/02/2022, 11:17 AM  Pager: [phone] After 5pm on weekdays and 1pm on weekends: On Call pager: [phone]

## 2022-05-02 NOTE — Progress Notes (Signed)
Physical Therapy Treatment Patient Details Name: ONELL MCMATH MRN: [id number] DOB: 11/15/1950 Today's Date: 05/02/2022   History of Present Illness 71 yo female admitted 9/18 from SNF with SOB and CHF exacerbation. PMHx: T2DM, osteoporosis, cellulitis, CAD, GERD, HTN, memory loss, 03/10/22 scattered infarcts    PT Comments    Pt pleasant and reports 3 month progressive decline in function with repeated hospitalizations and SNf. Pt fatigues quickly and benefits from encouragement to continue to attempt further gait trials after seated rest. Use of rollator certainly would be beneficial for seated rest and continued progression. Will continue to follow.     Recommendations for follow up therapy are one component of a multi-disciplinary discharge planning process, led by the attending physician.  Recommendations may be updated based on patient status, additional functional criteria and insurance authorization.  Follow Up Recommendations  Skilled nursing-short term rehab (<3 hours/day) Can patient physically be transported by private vehicle: Yes   Assistance Recommended at Discharge Intermittent Supervision/Assistance  Patient can return home with the following A little help with walking and/or transfers;A little help with bathing/dressing/bathroom;Assistance with cooking/housework;Assist for transportation;Help with stairs or ramp for entrance   Equipment Recommendations  Rollator (4 wheels)    Recommendations for Other Services       Precautions / Restrictions Precautions Precautions: Fall     Mobility  Bed Mobility               General bed mobility comments: pt in WC on arrival and end of session    Transfers Overall transfer level: Needs assistance   Transfers: Sit to/from Stand Sit to Stand: Min guard           General transfer comment: cues for hand placement and to back fully to surface x 2 trials    Ambulation/Gait Ambulation/Gait assistance: Min  guard Gait Distance (Feet): 40 Feet Assistive device: Rolling walker (2 wheels) Gait Pattern/deviations: Step-through pattern, Decreased stride length, Trunk flexed   Gait velocity interpretation: <1.8 ft/sec, indicate of risk for recurrent falls   General Gait Details: cues for posture and progressive activity. Pt able to walk 30' then 59' with seated rest between trials. HR 61 and SPO2 98% on RA   Stairs             Wheelchair Mobility    Modified Rankin (Stroke Patients Only)       Balance Overall balance assessment: Needs assistance Sitting-balance support: No upper extremity supported, Feet supported Sitting balance-Leahy Scale: Good     Standing balance support: Bilateral upper extremity supported Standing balance-Leahy Scale: Poor Standing balance comment: bil UE on RW in standing                            Cognition Arousal/Alertness: Awake/alert Behavior During Therapy: WFL for tasks assessed/performed Overall Cognitive Status: Within Functional Limits for tasks assessed                                          Exercises General Exercises - Lower Extremity Long Arc Quad: AROM, Both, 15 reps, Seated Hip Flexion/Marching: AROM, Both, 10 reps, Seated    General Comments        Pertinent Vitals/Pain Pain Assessment Pain Score: 3  Pain Location: bil LE Pain Descriptors / Indicators: Aching, Guarding Pain Intervention(s): Limited activity within patient's tolerance, Monitored during session, Repositioned  Home Living                          Prior Function            PT Goals (current goals can now be found in the care plan section) Progress towards PT goals: Progressing toward goals    Frequency    Min 2X/week      PT Plan Current plan remains appropriate    Co-evaluation              AM-PAC PT "6 Clicks" Mobility   Outcome Measure  Help needed turning from your back to your side  while in a flat bed without using bedrails?: A Little Help needed moving from lying on your back to sitting on the side of a flat bed without using bedrails?: A Little Help needed moving to and from a bed to a chair (including a wheelchair)?: A Little Help needed standing up from a chair using your arms (e.g., wheelchair or bedside chair)?: A Little Help needed to walk in hospital room?: A Little Help needed climbing 3-5 steps with a railing? : Total 6 Click Score: 16    End of Session   Activity Tolerance: Patient tolerated treatment well Patient left: in chair;with call bell/phone within reach;with family/visitor present Nurse Communication: Mobility status PT Visit Diagnosis: Muscle weakness (generalized) (M62.81);Difficulty in walking, not elsewhere classified (R26.2)     Time: 7711-6579 PT Time Calculation (min) (ACUTE ONLY): 30 min  Charges:  $Gait Training: 8-22 mins $Therapeutic Exercise: 8-22 mins                     Bayard Males, PT Acute Rehabilitation Services Office: [place] 05/02/2022, 11:49 AM

## 2022-05-02 NOTE — TOC Progression Note (Addendum)
Transition of Care [facility]) - Progression Note    Patient Details  Name: Alexandra Baker MRN: [id number] Date of Birth: March 26, 1951  Transition of Care [facility]) CM/SW [place], [place] Phone Number: 05/02/2022, 2:24 PM  Clinical Narrative:     CSW notified [facility] SNF liaison of anticipated DC date tomorrow. Tomorrow, 05/03/22, is last day that Josem Kaufmann is good through. Will need to restart auth if pt does not DC tomorrow.   1500: nurse tech notified CSW that pt requesting information regarding DC to SNF. CSW met with pt and updated her regarding insurance auth and anticipated DC.   Expected Discharge Plan: [place] Barriers to Discharge: Continued Medical Work up  Expected Discharge Plan and Services Expected Discharge Plan: [place] arrangements for the past 2 months: [place]                                       Social Determinants of Health (SDOH) Interventions    Readmission Risk Interventions     No data to display

## 2022-05-02 NOTE — Progress Notes (Signed)
Orthopedic Tech Progress Note Patient Details:  MERYEM HAERTEL 1950/10/28 [id number]  Myself and the other ortho tech went to apply UNNA BOOTS on patient, before we could apply the boots, patient asked if she had a cut/nit on her leg, I cut on the light and she had 2 little spots on her RLE after I washed off the the dried blood, patient asked if we could hold off on doing her UNNA BOOTS so that way she could have a talk with MD just to make sure the nits would not harm her. I did let her know they were small and it should not harm her but do to her health she wanted to double check. I left UNNA PASTE and coban in room for the morning      Patient ID: CARRIE USERY, female   DOB: Oct 26, 1950, 71 y.o.   MRN: [id number]  Janit Pagan 05/02/2022, 5:50 PM

## 2022-05-03 DIAGNOSIS — I509 Heart failure, unspecified: Secondary | ICD-10-CM

## 2022-05-03 LAB — GLUCOSE, CAPILLARY
Glucose-Capillary: 109 mg/dL — ABNORMAL HIGH (ref 70–99)
Glucose-Capillary: 90 mg/dL (ref 70–99)
Glucose-Capillary: 172 mg/dL — ABNORMAL HIGH (ref 70–99)
Glucose-Capillary: 154 mg/dL — ABNORMAL HIGH (ref 70–99)

## 2022-05-03 LAB — BASIC METABOLIC PANEL
Anion gap: 16 — ABNORMAL HIGH (ref 5–15)
BUN: 70 mg/dL — ABNORMAL HIGH (ref 8–23)
CO2: 25 mmol/L (ref 22–32)
Calcium: 9.4 mg/dL (ref 8.9–10.3)
Chloride: 90 mmol/L — ABNORMAL LOW (ref 98–111)
Creatinine, Ser: 3.09 mg/dL — ABNORMAL HIGH (ref 0.44–1.00)
GFR, Estimated: 16 mL/min — ABNORMAL LOW (ref >60)
Glucose, Bld: 106 mg/dL — ABNORMAL HIGH (ref 70–99)
Potassium: 3.7 mmol/L (ref 3.5–5.1)
Sodium: 131 mmol/L — ABNORMAL LOW (ref 135–145)

## 2022-05-03 LAB — MAGNESIUM: Magnesium: 2 mg/dL (ref 1.7–2.4)

## 2022-05-03 MED ORDER — HYDROCODONE-ACETAMINOPHEN 5-325 MG PO TABS: 1.0000 | ORAL_TABLET | Freq: Two times a day (BID) | ORAL | 0 refills | Status: DC | PRN

## 2022-05-03 MED ORDER — ROSUVASTATIN CALCIUM 10 MG PO TABS: 10.0000 mg | ORAL_TABLET | Freq: Every day | ORAL | 1 refills | Status: AC

## 2022-05-03 NOTE — Progress Notes (Signed)
OT Cancellation Note  Patient Details Name: KAIYAH EBER MRN: [id number] DOB: 1951/01/13   Cancelled Treatment:    Reason Eval/Treat Not Completed: Other (comment). Pt preparing for d/c and politely declines. States she is happy to be heading out today.   Tyrone Schimke, OT Acute Rehabilitation Services Office: [phone]   Hortencia Pilar 05/03/2022, 12:58 PM

## 2022-05-03 NOTE — [place] FL2 (Signed)
[place] MEDICAID FL2 LEVEL OF CARE SCREENING TOOL     IDENTIFICATION  Patient Name: Alexandra Baker Birthdate: 1951/02/26 Sex: female Admission Date (Current Location): 04/18/2022  [facility] and [place] Number:  Herbalist and Address:  [facility] [place], [place] [address] [place], [place] [address]      Provider Number: [id number]  Attending Physician Name and Address:  Lucious Groves, DO  Relative Name and Phone Number:       Current Level of Care: Hospital Recommended Level of Care: [place] Prior Approval Number:    Date Approved/Denied:   PASRR Number: [id number]  Discharge Plan: SNF    Current Diagnoses: Patient Active Problem List   Diagnosis Date Noted   PAD (peripheral artery disease) ([place]) 04/29/2022   Wound of left leg    Acute exacerbation of congestive heart failure ([place]) 04/18/2022   Colonoscopy refused 11/10/2021   Anemia 06/08/2021   OSA on CPAP 06/08/2021   Dyspnea on exertion 05/14/2021   AKI (acute kidney injury) ([place]) 04/01/2021   Complete tear of right rotator cuff 03/10/2021   Impingement syndrome of right shoulder 03/10/2021   Chronic obstructive pulmonary disease ([place]) 12/30/2020   Morbid obesity ([place]) 12/30/2020   Age-related osteoporosis without current pathological fracture 06/23/2020   Chronic combined systolic and diastolic CHF (congestive heart failure) ([place]) 12/31/2019   Ischemic cardiomyopathy 12/31/2019   CAD (coronary artery disease) of bypass graft 07/18/2019   GAD (generalized anxiety disorder) 07/18/2019   GERD (gastroesophageal reflux disease) 07/18/2019   Hypertension 07/18/2019   Uncontrolled type 2 diabetes mellitus with microalbuminuria, with long-term current use of insulin 07/18/2019   History of coronary artery bypass graft 07/18/2019   Congestive heart failure ([place]) New York Heart Association class III 07/18/2019   Cardiomyopathy ([place]) ejection fraction 4045% in November  2020 07/18/2019   Medically noncompliant 09/12/2018   Heme positive stool 06/20/2018   Tremor 06/20/2018   Memory loss 06/20/2018   Stage 2 chronic kidney disease 02/14/2017   Stage 3b chronic kidney disease ([place]) 02/14/2017   Asthmatic bronchitis without complication 61/95/0932   Benign essential hypertension 05/11/2016   Uncontrolled type 2 diabetes mellitus with hyperglycemia ([place]) 05/11/2016   Hyperlipidemia 05/11/2016   Left leg cellulitis 08/27/2014    Orientation RESPIRATION BLADDER Height & Weight     Self, Time, Situation, Place  Normal Incontinent Weight: 159 lb 9.8 oz (72.4 kg) (scale a) Height:  4' 9.5" (146.1 cm)  BEHAVIORAL SYMPTOMS/MOOD NEUROLOGICAL BOWEL NUTRITION STATUS      Continent Diet (see d/c summary)  AMBULATORY STATUS COMMUNICATION OF NEEDS Skin   Extensive Assist Verbally Other (Comment) (Leg wound, pretibial, left)                       Personal Care Assistance Level of Assistance  Bathing, Feeding, Dressing Bathing Assistance: Limited assistance Feeding assistance: Independent Dressing Assistance: Limited assistance     Functional Limitations Info  Sight, Hearing, Speech Sight Info: Impaired Hearing Info: Adequate Speech Info: Adequate    SPECIAL CARE FACTORS FREQUENCY  PT (By licensed PT), OT (By licensed OT)     PT Frequency: 5x/week OT Frequency: 5x/week            Contractures Contractures Info: Not present    Additional Factors Info  Code Status, Allergies Code Status Info: Full code Allergies Info: Pitocin (oxytocin), green tea, Ezetimibe-simvastatin, wound dressing adhesive(all tape)  Current Medications (05/03/2022):  This is the current hospital active medication list Current Facility-Administered Medications  Medication Dose Route Frequency Provider Last Rate Last Admin   acetaminophen (TYLENOL) tablet 650 mg  650 mg Oral Q6H PRN Lacinda Axon, MD   650 mg at 05/02/22 2234   Or   acetaminophen  (TYLENOL) suppository 650 mg  650 mg Rectal Q6H PRN Lacinda Axon, MD       carvedilol (COREG) tablet 6.25 mg  6.25 mg Oral BID WC Iona Coach, MD   6.25 mg at 05/02/22 1722   clopidogrel (PLAVIX) tablet 75 mg  75 mg Oral Daily Iona Coach, MD   75 mg at 05/02/22 1001   dapagliflozin propanediol (FARXIGA) tablet 10 mg  10 mg Oral Daily Iona Coach, MD   10 mg at 05/02/22 1000   enoxaparin (LOVENOX) injection 30 mg  30 mg Subcutaneous Q24H Lacinda Axon, MD   30 mg at 05/02/22 2234   hydrALAZINE (APRESOLINE) tablet 10 mg  10 mg Oral Q8H Iona Coach, MD   10 mg at 05/03/22 1950   HYDROcodone-acetaminophen (NORCO/VICODIN) 5-325 MG per tablet 1 tablet  1 tablet Oral Q12H PRN Lacinda Axon, MD   1 tablet at 05/02/22 1404   insulin aspart (novoLOG) injection 0-15 Units  0-15 Units Subcutaneous TID WC Iona Coach, MD   5 Units at 05/02/22 1256   insulin aspart (novoLOG) injection 4 Units  4 Units Subcutaneous TID WC Nani Gasser, MD   4 Units at 05/03/22 0750   insulin glargine-yfgn (SEMGLEE) injection 17 Units  17 Units Subcutaneous QHS Nani Gasser, MD   17 Units at 05/02/22 2233   isosorbide mononitrate (IMDUR) 24 hr tablet 30 mg  30 mg Oral Daily Iona Coach, MD   30 mg at 05/02/22 1001   leptospermum manuka honey ([place]) paste 1 Application  1 Application Topical Daily Charise Killian, MD   1 Application at 93/26/71 1046   mometasone-formoterol (DULERA) 200-5 MCG/ACT inhaler 2 puff  2 puff Inhalation BID Iona Coach, MD   2 puff at 05/03/22 2458   naphazoline-glycerin (CLEAR EYES REDNESS) ophth solution 1-2 drop  1-2 drop Right Eye QID PRN Nani Gasser, MD   1 drop at 05/02/22 1006   ondansetron (ZOFRAN) tablet 4 mg  4 mg Oral Q6H PRN Lacinda Axon, MD       Or   ondansetron [facility]) injection 4 mg  4 mg Intravenous Q6H PRN Lacinda Axon, MD       pantoprazole (PROTONIX) EC tablet 40 mg  40 mg Oral Daily Iona Coach, MD   40 mg at 05/02/22  1001   rosuvastatin (CRESTOR) tablet 10 mg  10 mg Oral Daily Nani Gasser, MD       senna-docusate (Senokot-S) tablet 1 tablet  1 tablet Oral QHS PRN Lacinda Axon, MD         Discharge Medications: Please see discharge summary for a list of discharge medications.  Relevant Imaging Results:  Relevant Lab Results:   Additional Information SSN [place], [place]

## 2022-05-03 NOTE — Discharge Summary (Signed)
Name: Alexandra Baker MRN: [id number] DOB: 1950-12-31 71 y.o. PCP: Alma Friendly, MD  Date of Admission: 04/18/2022 10:08 AM Date of Discharge: 05/03/2022 11:43 AM Attending Physician: Lucious Groves, DO  Discharge Diagnosis: Principal Problem:   Acute exacerbation of congestive heart failure ([place]) Active Problems:   Benign essential hypertension   CAD (coronary artery disease) of bypass graft   Uncontrolled type 2 diabetes mellitus with hyperglycemia ([place])   Morbid obesity ([place])   Stage 3b chronic kidney disease ([place])   OSA on CPAP   PAD (peripheral artery disease) ([place])   Wound of left leg   Discharge Medications: Allergies as of 05/03/2022       Reactions   Pitocin [oxytocin]    Went into a coma   Tea Hives   Green tea   Wound Dressing Adhesive Hives   All tape   Ezetimibe-simvastatin Diarrhea, Nausea Only        Medication List     STOP taking these medications    aspirin EC 81 MG tablet   Entresto 24-26 MG Generic drug: sacubitril-valsartan       TAKE these medications    acetaminophen 325 MG tablet Commonly known as: TYLENOL Take 650 mg by mouth every 4 (four) hours as needed for moderate pain or headache.   albuterol 108 (90 Base) MCG/ACT inhaler Commonly known as: VENTOLIN HFA Inhale 2 puffs into the lungs every 4 (four) hours as needed for wheezing.   Arginaid Pack Take 1 packet by mouth See admin instructions. Mix 1 packet with 4-6 ounces of water twice a day   budesonide-formoterol 160-4.5 MCG/ACT inhaler Commonly known as: SYMBICORT Inhale 2 puffs into the lungs 2 (two) times daily.   carvedilol 6.25 MG tablet Commonly known as: COREG Take 1 tablet (6.25 mg total) by mouth 2 (two) times daily with a meal.   clopidogrel 75 MG tablet Commonly known as: PLAVIX Take 75 mg by mouth daily.   dapagliflozin propanediol 10 MG Tabs tablet Commonly known as: FARXIGA Take 10 mg by mouth daily.   diphenhydrAMINE 25 mg  capsule Commonly known as: BENADRYL Take 1 capsule by mouth every 4 (four) hours as needed for itching.   hydrALAZINE 10 MG tablet Commonly known as: APRESOLINE TAKE ONE (1) TABLET BY MOUTH 3 TIMES DAILY What changed: See the new instructions.   HYDROcodone-acetaminophen 5-325 MG tablet Commonly known as: NORCO/VICODIN Take 1 tablet by mouth every 12 (twelve) hours as needed for moderate pain.   insulin lispro 100 UNIT/ML KwikPen Commonly known as: HUMALOG Inject 12 Units into the skin 3 (three) times daily before meals.   ipratropium-albuterol 0.5-2.5 (3) MG/3ML Soln Commonly known as: DUONEB Take 3 mLs by nebulization as needed for shortness of breath or wheezing.   isosorbide mononitrate 30 MG 24 hr tablet Commonly known as: IMDUR Take 1 tablet (30 mg total) by mouth daily.   melatonin 3 MG Tabs tablet Take 3 mg by mouth at bedtime.   omeprazole 40 MG capsule Commonly known as: PRILOSEC Take 40 mg by mouth daily.   PROBIOTIC & ACIDOPHILUS EX ST PO Take 1 capsule by mouth daily.   promethazine 12.5 MG tablet Commonly known as: PHENERGAN Take 1 tablet by mouth every 6 (six) hours as needed.   rosuvastatin 10 MG tablet Commonly known as: CRESTOR Take 1 tablet (10 mg total) by mouth daily. Start taking on: May 04, 2022 What changed:  medication strength how much to take   senna-docusate 8.6-50 MG tablet  Commonly known as: Senokot-S Take 2 tablets by mouth every other day.   Torsemide 40 MG Tabs Take 40 mg by mouth 2 (two) times daily.   Toujeo SoloStar 300 UNIT/ML Solostar Pen Generic drug: insulin glargine (1 Unit Dial) Inject 14 Units into the skin at bedtime.   witch hazel-glycerin pad Commonly known as: TUCKS Apply 1 Application topically as needed for itching.               Discharge Care Instructions  (From admission, onward)           Start     Ordered   05/03/22 0000  Discharge wound care:       Comments: Bilateral Unna Boots  for lower extremity edema and to help promote healing of left lower extremity wound. Change twice weekly.   05/03/22 1142            Disposition and follow-up:   Alexandra Baker is a 71 y.o. year old female admitted to [facility] for acute decompensated heart failure and worsening renal function. They were discharged from [facility] on hospital day 15 in Good condition.  At the hospital follow up visit please address:  Heart failure with moderately reduced ejection fraction Acute decompensated heart failure, improved Thought to be due to inadequate diuresis secondary to worsening chronic kidney disease.  Discharged on the following regimen: - Torsemide 40 mg p.o. twice daily - Farxiga 10 mg daily - Entresto was held due to worsening renal function - Carvedilol 6.25 mg twice daily - Hydralazine 10 mg 3 times daily - Imdur 30 mg daily - Continue lower legs compression  -- Dry weight at discharge: 159 lbs  AKI on CKD 3 versus progression to CKD 4 Creatinine 3.09 on day of discharge, EGFR of 16, down from baseline of 35 in April 2023. -Recheck BMP at follow up  ASCVD Stable.  ABIs showed mild peripheral artery disease in bilateral lower extremities. - Plavix 75 mg daily - Rosuvastatin decreased from 40 mg to 10 mg daily due to decreased creatinine clearance  Lower extremity edema Lower extremity venous stasis Nonhealing left lower extremity venous ulcer Unna boot used during hospitalization to good effect - Consider continuation of Unna boots with weekly or twice weekly change - Reevaluate wound  Labs / imaging needed at time of follow-up: - BMP  Follow-up Appointments:  Follow-up Information     Alma Friendly, MD Follow up on 05/09/2022.   Specialty: Internal Medicine Why: $RemoveBefor'@4'[id number]$ :20pm Contact information: [address] [place] [address] [phone]         Park Liter, MD .   Specialty:  Cardiology Contact information: [place] [address] [phone]         Leanor Kail, [place] Follow up on 05/05/2022.   Specialty: Cardiology Why: Please arrive at 1:15 pm for your 1:30 appointment. Contact information: [address] [place] [address] [phone]                 Hospital Course by problem list: Alexandra Baker is a 71 y.o. with a PMH of HFmrEF (EF 45-50%), CAD s/p ABG, HTN, HLD, CKD 3, ASCVD, type 2 diabetes, admitted for acute decompensated heart failure   Acute HFmrEF exacerbation Patient was previously admitted in August 2023 at [facility] for acute on chronic heart failure exacerbation, treated with IV diuretic and transition to p.o. torsemide.  She was discharged at 159 pounds.  Patient followed  up with cardiology on 9/12 and was advised to increase the torsemide to 40 mg twice daily due to hypervolemia.  Patient was admitted to the internal medicine teaching service on 9/18 for progressive dyspnea, orthopnea, weight gain and lower extremity edema, found to have decompensated heart failure.  BNP on admission was 2427.  Chest x-ray showed cardiomegaly with bilateral pleural effusion.  DVT was ruled out.  She had +2 pitting edema of bilateral lower extremity.  Patient was started on IV Lasix.  We were able to achieve optimal diuresis on IV Lasix 80 mg TID in combination with metolazone and acetazolamide.  Unna boots were utilized to help with her lower extremity edema.  Her weight at discharge was 159 pounds.  Symptoms of orthopnea and dyspnea have resolved.  We suspect that patient was being under diuresed after her last admission. We placed her on Torsemide 80 mg BID but she developed an AKI so patient was transitioned to torsemide 40 mg twice daily at discharge.  Delene Loll was held at discharge due to worsening kidney function and low normal blood pressure.  CKD 4 Baseline creatinine from last admission was 1.6-1.7.  Her  creatinine was found to be elevated in the range of 2.3-2.5 with GFR around 15-20.  Her kidney function was stable throughout this admission despite aggressive diuresis.  We suspect that her CKD 3B has progressed to CKD 4.  LLE venous stasis wound Patient is found to have erythema of the LLE with scabbed medial and posterior wounds without wheeping or warmth. She has completed two antibiotic courses for cellulitis of this area as well as abscess drainage within the last 3 months.  No evidence of active infection.  DVT was ruled out.  Suspect her lower extremity edema from venous stasis plays a role in impaired wound healing.  Extremity compression with Unna boots was placed which help with her edema.  Discharge Exam:  Subjective: No new shortness of breath. Leg swelling improved. Continues to report soreness of left lower extremity. Otherwise, eager to be discharged from the hospital.   Blood pressure (!) 127/59, pulse 70, temperature 97.8 F (36.6 C), temperature source Oral, resp. rate 16, height 4' 9.5" (1.461 m), weight 72.4 kg, SpO2 96 %. General: Well-appearing. Alert. Sitting upright in wheelchair. Cardiovascular: Regular rate and rhythm. Respiratory: Normal work of breathing.  Lung bases without crackles. Extremities: Bilateral lower extremity edema.  Left lower extremity wound with overlying eschar. Skin: Warm and dry. Neuro: Alert.  No gross focal deficits. Psych: Pleasant.  Appropriate mood and affect.  Pertinent studies and procedures:   Latest Reference Range & Units 05/03/22 00:42  Sodium 135 - 145 mmol/L 131 (L)  Potassium 3.5 - 5.1 mmol/L 3.7  Chloride 98 - 111 mmol/L 90 (L)  CO2 22 - 32 mmol/L 25  Glucose 70 - 99 mg/dL 106 (H)  BUN 8 - 23 mg/dL 70 (H)  Creatinine 0.44 - 1.00 mg/dL 3.09 (H)  Calcium 8.9 - 10.3 mg/dL 9.4  Anion gap 5 - 15  16 (H)  Magnesium 1.7 - 2.4 mg/dL 2.0  GFR, Estimated >60 mL/min 16 (L)  (L): Data is abnormally low (H): Data is abnormally  high  Lab Results  Component Value Date   CREATININE 3.09 (H) 05/03/2022   CREATININE 3.33 (H) 05/02/2022   CREATININE 2.91 (H) 05/01/2022     Latest Reference Range & Units 04/18/22 10:17  B Natriuretic Peptide 0.0 - 100.0 pg/mL 2,427.9 (H)  (H): Data is abnormally high  VAS  [place] ABI: Right: Resting right ankle-brachial index indicates mild right lower  extremity arterial disease. The right toe-brachial index is abnormal.   Left: Resting left ankle-brachial index indicates noncompressible left  lower extremity arteries. The left toe-brachial index is abnormal.  04/18/2022 CXR: Cardiomegaly and bilateral pleural effusions.  Discharge Instructions:   Discharge Instructions      Alexandra Baker  You were admitted for weight gain, leg swelling, and shortness of breath and treated for acute exacerbation of heart failure.  You were treated with several days of IV diuretic medicine to help your kidneys excrete the excess fluid that had accumulated in your body.  We are discharging you home now that you are doing better. To help assist you on your road to recovery, I have written the following recommendations:   For your heart failure, please continue taking torsemide 40 mg twice daily.  Because of your worsening kidney function, I recommend not taking Entresto until you follow-up with your doctor outside of the hospital.  For your cholesterol, I have reduced the dose of your Crestor from 40 mg daily to 10 mg daily.  This is due to your depressed kidney function.  Please continue taking the rest of your medications as prescribed.  Below are the results of some pertinent labs and studies:  Lab Results  Component Value Date   CREATININE 3.09 (H) 05/03/2022   CREATININE 3.33 (H) 05/02/2022   CREATININE 2.91 (H) 05/01/2022   VAS [place] ABI WITH/WO TBI  Summary: Right: Resting right ankle-brachial index indicates mild right lower extremity arterial disease. The right toe-brachial  index is abnormal. Left: Resting left ankle-brachial index indicates noncompressible left lower extremity arteries. The left toe-brachial index is abnormal. Electronically signed by Deitra Mayo MD on 04/29/2022 at 1:31:48 PM.   Follow-up Information     Alma Friendly, MD Follow up on 05/09/2022.   Specialty: Internal Medicine Why: @4 :20pm Contact information: [address] [place] [address] [phone]         Park Liter, MD .   Specialty: Cardiology Contact information: [place] [address] [phone]         Leanor Kail, [place] Follow up on 05/05/2022.   Specialty: Cardiology Why: Please arrive at 1:15 pm for your 1:30 appointment. Contact information: [place] [address] [phone]                It was a privilege to be a part of your hospital care team, and I hope you feel better as a result of your stay.  All the best, Nani Gasser, MD     Signed: Nani Gasser MD 05/03/2022, 11:43 AM   Pager: [phone]

## 2022-05-03 NOTE — Progress Notes (Signed)
Orthopedic Tech Progress Note Patient Details:  Alexandra Baker 1950-10-04 [id number]  Ortho Devices Type of Ortho Device: Haematologist Ortho Device/Splint Location: BLE Ortho Device/Splint Interventions: Ordered, Application, Adjustment   Post Interventions Patient Tolerated: Well Instructions Provided: Care of [place] 05/03/2022, 9:19 AM

## 2022-05-03 NOTE — TOC Transition Note (Signed)
Transition of Care [facility]) - CM/SW Discharge Note   Patient Details  Name: Alexandra Baker MRN: [id number] Date of Birth: March 18, 1951  [facility] CM/SW Contact:  Bethann Berkshire, [place] Phone Number: 05/03/2022, 2:00 PM   Clinical Narrative:     Patient will DC to: [facility] Anticipated DC date: 05/03/22 Family notified: Daughter Engineer, materials by: Corey Harold   Per MD patient ready for DC to [facility] . RN, patient, patient's family, and facility notified of DC. Discharge Summary and FL2 sent to facility. RN to call report prior to discharge [phone]). DC packet on chart. Ambulance transport requested for patient.   CSW will sign off for now as social work intervention is no longer needed. Please consult [place] again if new needs arise.   Final next level of care: Skilled Nursing Facility Barriers to Discharge: No Barriers Identified   Patient Goals and CMS Choice        Discharge Placement              Patient [facility] bed at:  [facility]) Patient to be transferred to facility by: [place] Name of family member notified: Daughter Museum/gallery conservator Patient and family notified of of transfer: 05/03/22  Discharge Plan and Services                                     Social Determinants of Health (SDOH) Interventions     Readmission Risk Interventions     No data to display

## 2022-05-03 NOTE — Progress Notes (Signed)
Report given to nurse at [facility]

## 2022-05-05 ENCOUNTER — Ambulatory Visit: Payer: Medicare Other | Attending: Physician Assistant | Admitting: Physician Assistant

## 2022-05-05 ENCOUNTER — Encounter: Payer: Self-pay | Admitting: Physician Assistant

## 2022-05-05 VITALS — BP 112/58 | HR 54 | Ht <= 58 in | Wt 157.0 lb

## 2022-05-05 DIAGNOSIS — I10 Essential (primary) hypertension: Secondary | ICD-10-CM | POA: Diagnosis not present

## 2022-05-05 DIAGNOSIS — R600 Localized edema: Secondary | ICD-10-CM

## 2022-05-05 DIAGNOSIS — I5042 Chronic combined systolic (congestive) and diastolic (congestive) heart failure: Secondary | ICD-10-CM

## 2022-05-05 DIAGNOSIS — I25810 Atherosclerosis of coronary artery bypass graft(s) without angina pectoris: Secondary | ICD-10-CM

## 2022-05-05 DIAGNOSIS — N184 Chronic kidney disease, stage 4 (severe): Secondary | ICD-10-CM

## 2022-05-05 NOTE — Patient Instructions (Signed)
Medication Instructions:  Your physician recommends that you continue on your current medications as directed. Please refer to the Current Medication list given to you today. *If you need a refill on your cardiac medications before your next appointment, please call your pharmacy*   Lab Work: None Ordered   Testing/Procedures: None Ordered   Follow-Up: At [facility], you and your health needs are our priority.  As part of our continuing mission to provide you with exceptional heart care, we have created designated Provider Care Teams.  These Care Teams include your primary Cardiologist (physician) and Advanced Practice Providers (APPs -  Physician Assistants and Nurse Practitioners) who all work together to provide you with the care you need, when you need it.  We recommend signing up for the patient portal called "MyChart".  Sign up information is provided on this After Visit Summary.  MyChart is used to connect with patients for Virtual Visits (Telemedicine).  Patients are able to view lab/test results, encounter notes, upcoming appointments, etc.  Non-urgent messages can be sent to your provider as well.   To learn more about what you can do with MyChart, go to [email or web address].    Your next appointment:   AS SCHEDULED  The format for your next appointment:   In Person  Provider:   [name], MD   Other Instructions   Important Information About [place]

## 2022-05-05 NOTE — Progress Notes (Signed)
Cardiology Office Note:    Date:  05/05/2022   ID:  Alexandra Baker, DOB [birth year], MRN [id number]  PCP:  Alma Friendly, MD  [facility] Cardiologist:  [name], MD  [place] Electrophysiologist:  None   Chief Complaint: Hospital follow up   History of Present Illness:    Alexandra Baker is a 71 y.o. female with a hx of CAD s/p CABG in 1999, ischemic cardiomyopathy with mildly reduced LVEF, HTN, HLD, CKD, CVA and DM seen for hospital follow up.    Stress test 04/2021 was abnormal showing ischemia involving anterior wall.  Echo 04/2021 with LVEF of 40-45%, mild MR and grade II DD Cath 04/2021 showed severe 3 V disease. Patent LIMA to LAD and patent SVG to dRCA with 50% stenosis at origin of the SVG. Elevated LVEDP . Felt symptoms due to CHF.    Last seen by Dr. Agustin Cree 10/2021.   Admitted 8/3-8/14 for cellulitis of LLE, hyponatremia and AonCKD. Had metabolic encephalopathy. No DVT.  MRI of the brain which showed several small scattered acute/recent infarcts seen throughout the bilateral cerebral white matter which may be central embolic in etiology. Echocardiogram with bubble study was negative. Added Plavix.    Admitted 8/23-9/5 for CHF exacerbation. No DVT on doppler. MRI tib-fib left without IV contrast showed Fluid collection/abscess abutting the anterior tibia with marked adjacent subcutaneous fat edema.. Underwent IR drainage. Transfused 2 units of platelets for thrombocytopenia. Treated for bacterial R eye conjunctivitis. Discharged to SNF. Plan to continue DAPT until 9/14 given ischemic CVA then Plavix only.   Seen in clinic my me 04/12/22. Volume overloaded. Failed outpatient tx with renal function issue.   Admitted 9/18-10/3 for CHF. Cardiology did not consulted. Diuresed. Discharge weight 159lb with Scr of 3.09. Unna boot for LE wound. No DVT.   Here today for follow up. Weight down 2 more pound.  Doing physical therapy at facility 2 times per day.  No  chest pain or shortness of breath.  Still has edema.  No syncope.  Currently on torsemide 40 mg twice daily.  Has kidney issue on higher dose.  Past Medical History:  Diagnosis Date   Age-related osteoporosis without current pathological fracture 06/23/2020   Formatting of this note might be different from the original. Bone density January 2021 T score -2.6 to right hip -1.1 to lumbar spine patient refusing treatment   Asthmatic bronchitis without complication 48/18/5631   Managed by dr. Alcide Clever   Benign essential hypertension 05/11/2016   Cardiomyopathy ([place]) ejection fraction 4045% in November 2020 07/18/2019   Cellulitis of left lower extremity 05/29/2019   Chronic combined systolic and diastolic CHF (congestive heart failure) ([place]) 12/31/2019   Seeing cards in [place]   Congestive heart failure ([place]) New York Heart Association class III 07/18/2019   Coronary disease 07/18/2019   Dyslipidemia 12/25/2019   GAD (generalized anxiety disorder) 07/18/2019   GERD (gastroesophageal reflux disease) 07/18/2019   Heme positive stool 06/20/2018   Refuses colonoscopy   Hyperlipidemia 05/11/2016   Hypertension 07/18/2019   Ischemic cardiomyopathy 12/31/2019   Medically noncompliant 09/12/2018   Memory loss 06/20/2018   5/5 mini cog refuses neuro eval Since heart surgery 11/19   Mild intermittent asthma without complication 49/70/2637   Managed by dr. Alcide Clever   Severe obesity (BMI 35.0-39.9) with comorbidity ([place]) 12/30/2020   Stage 2 chronic kidney disease 02/14/2017   Stage 3b chronic kidney disease ([place]) 02/14/2017   Status post coronary artery bypass graft 1999 [facility] 07/18/2019   Tremor 06/20/2018   Declines neuro eval   Uncontrolled type 2 diabetes mellitus with hyperglycemia ([place]) 05/11/2016   Uncontrolled type 2 diabetes mellitus with microalbuminuria, with long-term current use of insulin 07/18/2019   Managed by dr. Tamala Julian endo exclusively  Pt noncompliant with  meds and ov    Past Surgical History:  Procedure Laterality Date   CARPAL TUNNEL RELEASE     CESAREAN SECTION     CHOLECYSTECTOMY     CORONARY ARTERY BYPASS GRAFT     DILATION AND CURETTAGE OF UTERUS     LEFT HEART CATH AND CORS/GRAFTS ANGIOGRAPHY N/A 05/14/2021   Procedure: LEFT HEART CATH AND CORS/GRAFTS ANGIOGRAPHY;  Surgeon: Martinique, Peter M, MD;  Location: [facility];  Service: Cardiovascular;  Laterality: N/A;   WRIST FRACTURE SURGERY      Current Medications: Current Meds  Medication Sig   acetaminophen (TYLENOL) 325 MG tablet Take 650 mg by mouth every 4 (four) hours as needed for moderate pain or headache.   albuterol (VENTOLIN HFA) 108 (90 Base) MCG/ACT inhaler Inhale 2 puffs into the lungs every 4 (four) hours as needed for wheezing.   budesonide-formoterol (SYMBICORT) 160-4.5 MCG/ACT inhaler Inhale 2 puffs into the lungs 2 (two) times daily.   carvedilol (COREG) 6.25 MG tablet Take 1 tablet (6.25 mg total) by mouth 2 (two) times daily with a meal.   clopidogrel (PLAVIX) 75 MG tablet Take 75 mg by mouth daily.   dapagliflozin propanediol (FARXIGA) 10 MG TABS tablet Take 10 mg by mouth daily.   diphenhydrAMINE (BENADRYL) 25 mg capsule Take 1 capsule by mouth every 4 (four) hours as needed for itching.   hydrALAZINE (APRESOLINE) 10 MG tablet TAKE ONE (1) TABLET BY MOUTH 3 TIMES DAILY (Patient taking differently: Take 10 mg by mouth every 6 (six) hours.)   HYDROcodone-acetaminophen (NORCO/VICODIN) 5-325 MG tablet Take 1 tablet by mouth every 12 (twelve) hours as needed for moderate pain.   Insulin Glargine, 1 Unit Dial, (TOUJEO SOLOSTAR) 300 UNIT/ML SOPN Inject 14 Units into the skin at bedtime.   insulin lispro (HUMALOG) 100 UNIT/ML KwikPen Inject 12 Units into the skin 3 (three) times daily before meals.   ipratropium-albuterol (DUONEB) 0.5-2.5 (3) MG/3ML SOLN Take 3 mLs by nebulization as needed for shortness of breath or wheezing.   isosorbide mononitrate (IMDUR) 30  MG 24 hr tablet Take 1 tablet (30 mg total) by mouth daily.   Lactobacillus TABS Take 1 tablet by mouth every other day.   melatonin 3 MG TABS tablet Take 3 mg by mouth at bedtime.   Nutritional Supplements (ARGINAID) PACK Take 1 packet by mouth See admin instructions. Mix 1 packet with 4-6 ounces of water twice a day   omeprazole (PRILOSEC) 40 MG capsule Take 40 mg by mouth daily.   Probiotic Product (PROBIOTIC & ACIDOPHILUS EX ST PO) Take 1 capsule by mouth daily.   promethazine (PHENERGAN) 12.5 MG tablet Take 1 tablet by mouth every 6 (six) hours as needed.   promethazine (PHENERGAN) 12.5 MG tablet Take 12.5 mg by mouth every 6 (six) hours as needed for nausea or vomiting.   rosuvastatin (CRESTOR) 10 MG tablet Take 1 tablet (10 mg total) by mouth daily.   senna-docusate (SENOKOT-S) 8.6-50 MG tablet Take 2 tablets by mouth every other day.   Torsemide (SOAANZ) 40 MG TABS Take by mouth.   Torsemide 40 MG TABS Take 40 mg by mouth 2 (two) times daily.   witch hazel-glycerin (TUCKS)  pad Apply 1 Application topically as needed for itching.     Allergies:   Pitocin [oxytocin], Tea, Wound dressing adhesive, and Ezetimibe-simvastatin   Social History   Socioeconomic History   Marital status: Widowed    Spouse name: Not on file   Number of children: Not on file   Years of education: Not on file   Highest education level: Not on file  Occupational History   Not on file  Tobacco Use   Smoking status: Never   Smokeless tobacco: Never  Vaping Use   Vaping Use: Never used  Substance and Sexual Activity   Alcohol use: Never   Drug use: Never   Sexual activity: Not on file  Other Topics Concern   Not on file  Social History Narrative   Not on file   Social Determinants of Health   Financial Resource Strain: Not on file  Food Insecurity: No Food Insecurity (04/18/2022)   Hunger Vital Sign    Worried About Running Out of Food in the Last Year: Never true    Ran Out of Food in the Last  Year: Never true  Transportation Needs: No Transportation Needs (04/18/2022)   PRAPARE - Hydrologist (Medical): No    Lack of Transportation (Non-Medical): No  Physical Activity: Not on file  Stress: Not on file  Social Connections: Not on file     Family History: [name]; Aneurysm in her father; Breast cancer in her mother and sister; Diabetes in her sister, sister, and sister; Hypertension in her mother; Kidney failure in her mother; Stroke in her mother.    ROS:   Please see the history of present illness.    All other systems reviewed and are negative.   EKGs/Labs/Other Studies Reviewed:    The following studies were reviewed today: As above   EKG:  EKG is not  ordered today.   Recent Labs: 04/12/2022: NT-Pro BNP 13,246 04/18/2022: ALT 18; B Natriuretic Peptide 2,427.9 04/19/2022: Hemoglobin 9.1; Platelets 145 05/03/2022: BUN 70; Creatinine, Ser 3.09; Magnesium 2.0; Potassium 3.7; Sodium 131  Recent Lipid Panel    Component Value Date/Time   CHOL 100 04/18/2022 1827   TRIG 56 04/18/2022 1827   HDL 52 04/18/2022 1827   CHOLHDL 1.9 04/18/2022 1827   VLDL 11 04/18/2022 1827   LDLCALC 37 04/18/2022 1827   LDLDIRECT 53 08/06/2021 1337     Physical Exam:    VS:  BP (!) 112/58   Pulse (!) 54   Ht 4\' 9"  (1.448 m)   Wt 157 lb (71.2 kg)   SpO2 97%   BMI 33.97 kg/m     Wt Readings from Last 3 Encounters:  05/05/22 157 lb (71.2 kg)  05/03/22 159 lb 9.8 oz (72.4 kg)  04/12/22 210 lb (95.3 kg)     GEN:  Well nourished, well developed in no acute distress HEENT: Normal NECK: No JVD; No carotid bruits LYMPHATICS: No lymphadenopathy CARDIAC: RRR, no murmurs, rubs, gallops RESPIRATORY:  Clear to auscultation without rales, wheezing or rhonchi  ABDOMEN: Soft, non-tender, non-distended MUSCULOSKELETAL:  1+  edema; dressing on LLENo deformity  SKIN: Warm and dry NEUROLOGIC:  Alert  and oriented x 3 PSYCHIATRIC:  Normal affect   ASSESSMENT AND PLAN:    CAD No chest pain.  On plavix for stroke (completed ASA) Continue Coreg, Imdur and statin.   2. ICM/acute on chronic systolic and diastolic CHF - Echo with  LVEF of 45-50% on 03/11/2022 at outside hospital  -Lost 50 pounds since last office visit.  Weight is trending down since recent discharge. -Continue current dose of carvedilol 6.25 mg twice daily, Farxiga 10 mg daily and torsemide. -Discontinued Entresto 24/26 mg twice daily due to renal function during admission -Continue hydralazine and Imdur  3. Ischemic stroke - was on DAPT until 9/14 then plavix alone -Family states that the neurologist did not recommended monitor  4. HTN -Blood pressure stable   5. CKD IV -  Scr around ~3 -Recommended diuretics managed By nephrology  Medication Adjustments/Labs and Tests Ordered: Current medicines are reviewed at length with the patient today.  Concerns regarding medicines are outlined above.  No orders of the defined types were placed in this encounter.  No orders of the defined types were placed in this encounter.   Patient Instructions  Medication Instructions:  Your physician recommends that you continue on your current medications as directed. Please refer to the Current Medication list given to you today. *If you need a refill on your cardiac medications before your next appointment, please call your pharmacy*   Lab Work: None Ordered   Testing/Procedures: None Ordered   Follow-Up: At [facility], you and your health needs are our priority.  As part of our continuing mission to provide you with exceptional heart care, we have created designated Provider Care Teams.  These Care Teams include your primary Cardiologist (physician) and Advanced Practice Providers (APPs -  Physician Assistants and Nurse Practitioners) who all work together to provide you with the care you need, when you need  it.  We recommend signing up for the patient portal called "MyChart".  Sign up information is provided on this After Visit Summary.  MyChart is used to connect with patients for Virtual Visits (Telemedicine).  Patients are able to view lab/test results, encounter notes, upcoming appointments, etc.  Non-urgent messages can be sent to your provider as well.   To learn more about what you can do with MyChart, go to [email or web address].    Your next appointment:   AS SCHEDULED  The format for your next appointment:   In Person  Provider:   [name], MD   Other Instructions   Important Information About Sugar         Signed, Leanor Kail, PA  05/05/2022 1:59 PM    [facility]

## 2022-05-17 DIAGNOSIS — Z5320 Procedure and treatment not carried out because of patient's decision for unspecified reasons: Secondary | ICD-10-CM | POA: Insufficient documentation

## 2022-05-24 IMAGING — CR DG CHEST 2V
2 series · 2 of 2 positions shown · non-contrast
Comparison: None

CLINICAL DATA: Abnormal stress test. History of coronary artery
bypass graft.

EXAM:
CHEST - 2 VIEW

[w chest pa]
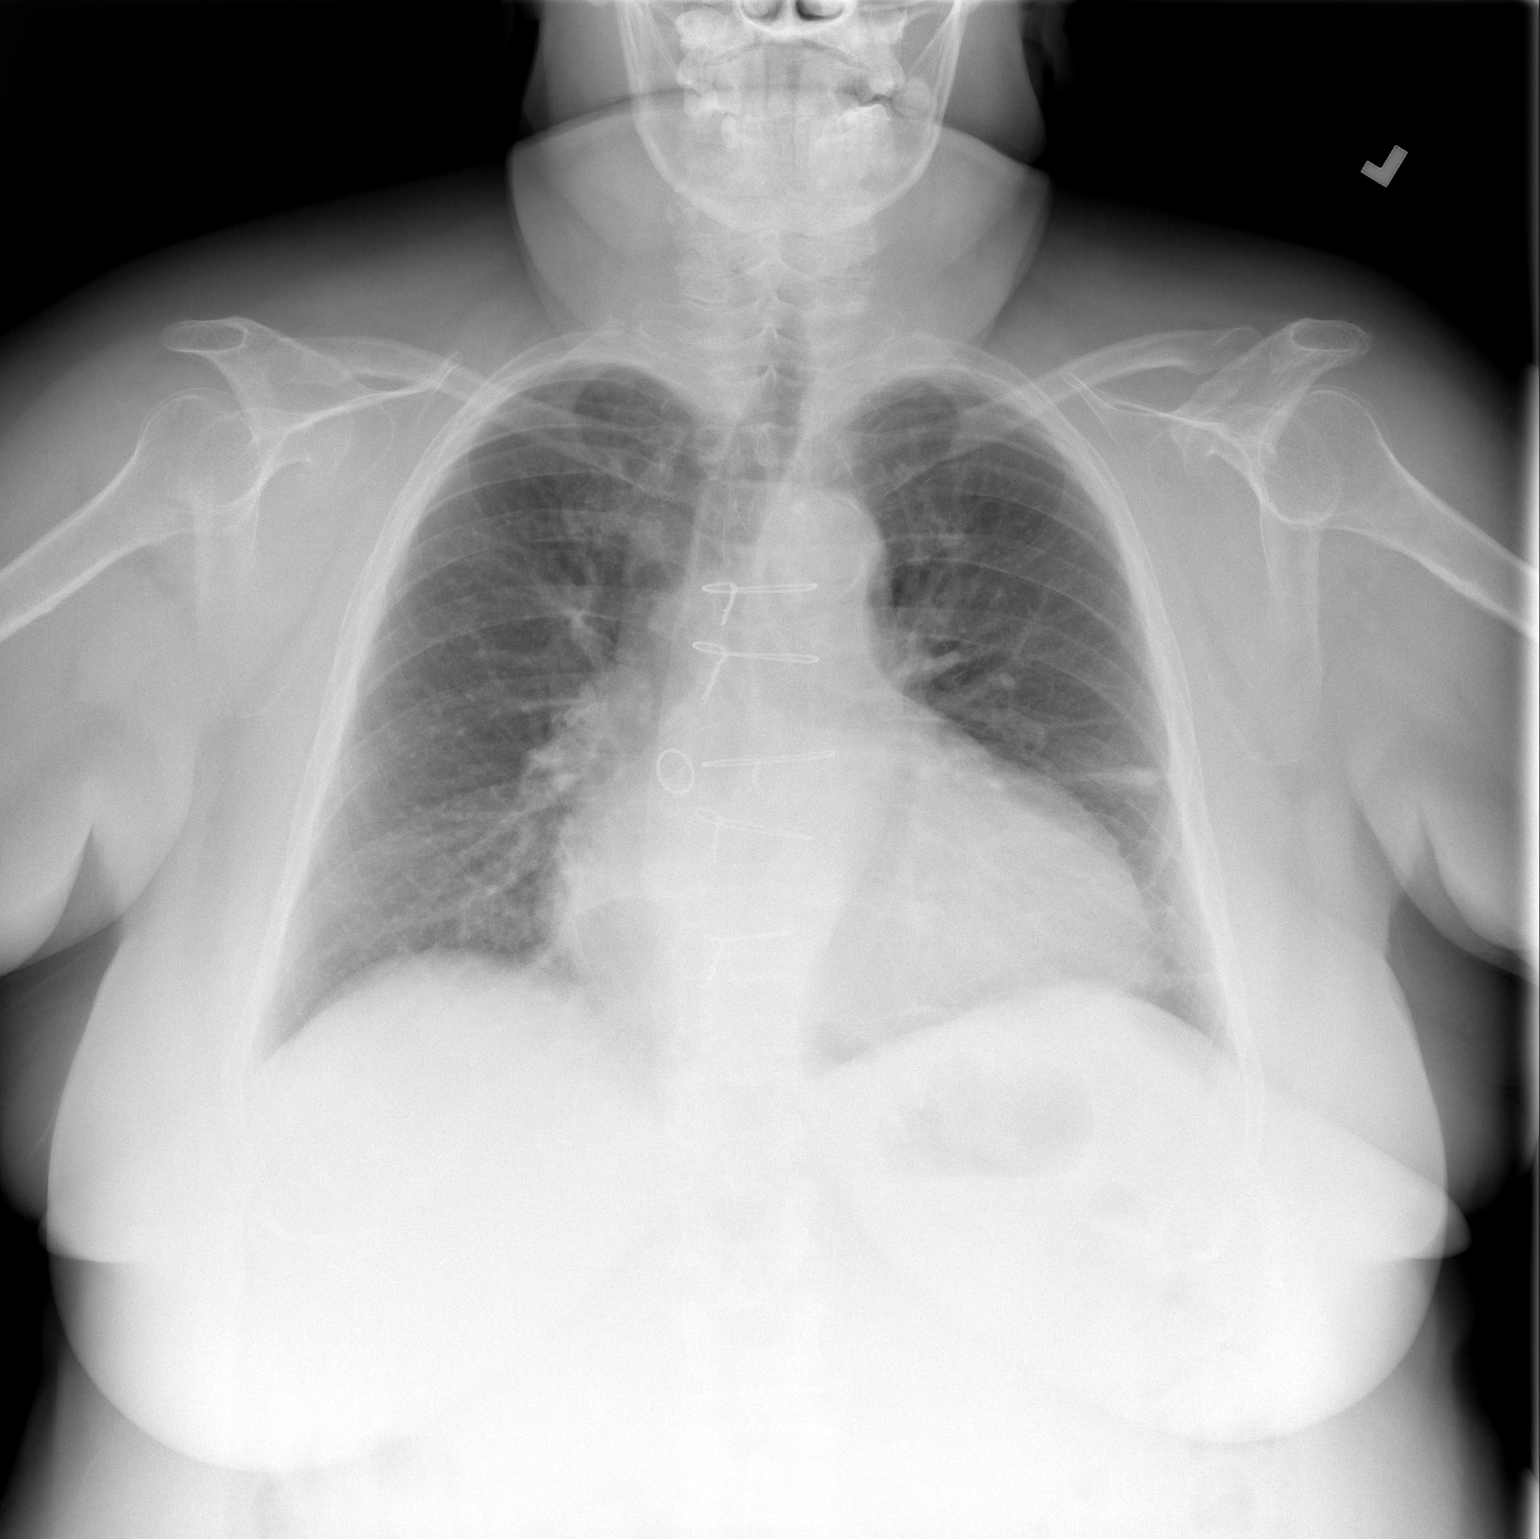

[w chest lat]
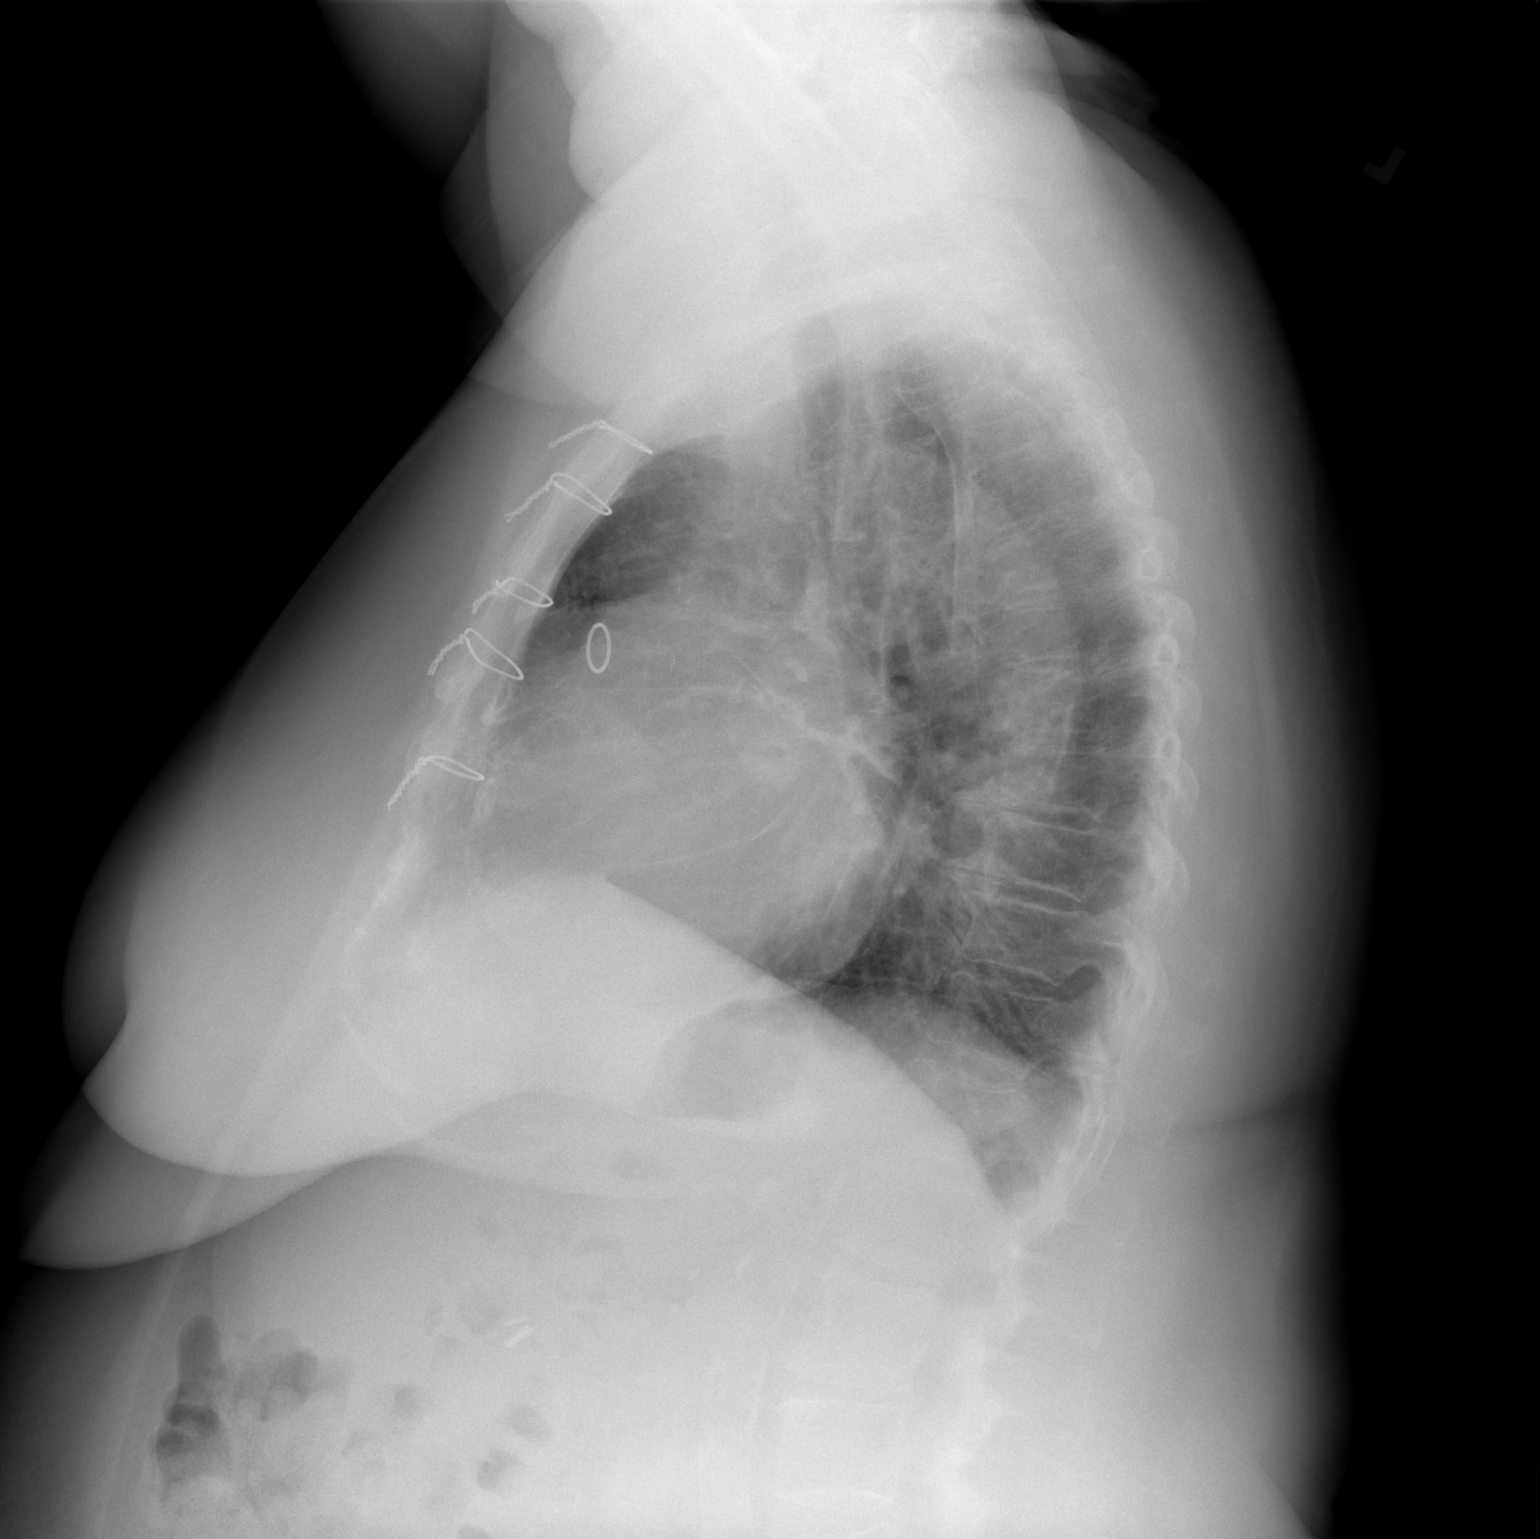

[2 of 2 positions shown; findings below may reference images not displayed]

FINDINGS: Prior median sternotomy and CABG. Heart size is upper limits of
normal. Band-like opacities within the left mid lung and left lung
base. Otherwise, no focal consolidation. No pleural effusion or
pneumothorax.
IMPRESSION: Band-like opacities within the left mid lung and left lung base,
likely atelectasis. Otherwise, no acute cardiopulmonary findings.

## 2022-05-27 ENCOUNTER — Ambulatory Visit: Payer: Medicare Other | Attending: Cardiology | Admitting: Cardiology

## 2022-05-27 ENCOUNTER — Encounter: Payer: Self-pay | Admitting: Cardiology

## 2022-05-27 VITALS — BP 136/62 | HR 58 | Ht <= 58 in | Wt 135.4 lb

## 2022-05-27 DIAGNOSIS — I10 Essential (primary) hypertension: Secondary | ICD-10-CM

## 2022-05-27 DIAGNOSIS — I5042 Chronic combined systolic (congestive) and diastolic (congestive) heart failure: Secondary | ICD-10-CM

## 2022-05-27 DIAGNOSIS — I255 Ischemic cardiomyopathy: Secondary | ICD-10-CM

## 2022-05-27 DIAGNOSIS — I25810 Atherosclerosis of coronary artery bypass graft(s) without angina pectoris: Secondary | ICD-10-CM

## 2022-05-27 DIAGNOSIS — Z8673 Personal history of transient ischemic attack (TIA), and cerebral infarction without residual deficits: Secondary | ICD-10-CM

## 2022-05-27 NOTE — Progress Notes (Unsigned)
Cardiology Office Note:    Date:  05/27/2022   ID:  [name], DOB 1951-07-10, MRN [id number]  PCP:  Alma Friendly, MD  Cardiologist:  [name], MD    Referring MD: Alma Friendly, MD   Chief Complaint  Patient presents with   Medication Management    Torsemide  Doing much better  History of Present Illness:    Alexandra Baker is a 71 y.o. female   with past medical history significant for coronary artery disease, status post coronary bypass grafting in 1999 done in [facility], cardiomyopathy with ejection fraction 45 to 50%, mild pulmonary hypertension, mild mitral valve regurgitation moderate biatrial enlargement, essential hypertension. She was referred to [place] initially for work-up before shoulder surgery.  Her symptomatology was atypical she was complaining of being short of breath as well as fatigue.  Eventually the stress test done stress test was abnormal showing ischemia in LAD territory thereafter cardiac catheterization has been performed which shows surprisingly lesion which most likely hemodynamically significant and very tortuous and calcified mid circumflex artery.  That artery got numerous 80 to 90% lesions.  That was assessed as poor target for interventions recommendation was to pursue medical therapy. She comes today to my office for follow-up after being hospitalized in multiple hospitals.  It looks like main reason was decompensated congestive heart failure, she aggressively very appropriately diuresed.  She lost significant amount of weight she is feeling significantly better swelling of lower extremities is gone, dyspnea on exertion improved quite significantly.  She does have some kidney dysfunction she is being followed by nephrology for this.   Past Medical History:  Diagnosis Date   Age-related osteoporosis without current pathological fracture 06/23/2020   Formatting of this note might be different from the original. Bone  density January 2021 T score -2.6 to right hip -1.1 to lumbar spine patient refusing treatment   Asthmatic bronchitis without complication 03/54/6568   Managed by dr. Alcide Clever   Benign essential hypertension 05/11/2016   Cardiomyopathy ([place]) ejection fraction 4045% in November 2020 07/18/2019   Cellulitis of left lower extremity 05/29/2019   Chronic combined systolic and diastolic CHF (congestive heart failure) ([place]) 12/31/2019   Seeing cards in [place]   Congestive heart failure ([place]) New York Heart Association class III 07/18/2019   Coronary disease 07/18/2019   Dyslipidemia 12/25/2019   GAD (generalized anxiety disorder) 07/18/2019   GERD (gastroesophageal reflux disease) 07/18/2019   Heme positive stool 06/20/2018   Refuses colonoscopy   Hyperlipidemia 05/11/2016   Hypertension 07/18/2019   Ischemic cardiomyopathy 12/31/2019   Medically noncompliant 09/12/2018   Memory loss 06/20/2018   5/5 mini cog refuses neuro eval Since heart surgery 11/19   Mild intermittent asthma without complication 12/75/1700   Managed by dr. Alcide Clever   Severe obesity (BMI 35.0-39.9) with comorbidity ([place]) 12/30/2020   Stage 2 chronic kidney disease 02/14/2017   Stage 3b chronic kidney disease ([place]) 02/14/2017   Status post coronary artery bypass graft 1999 done at [facility] hospital 07/18/2019   Tremor 06/20/2018   Declines neuro eval   Uncontrolled type 2 diabetes mellitus with hyperglycemia ([place]) 05/11/2016   Uncontrolled type 2 diabetes mellitus with microalbuminuria, with long-term current use of insulin 07/18/2019   Managed by dr. Tamala Julian endo exclusively  Pt noncompliant with meds and ov    Past Surgical History:  Procedure Laterality Date   CARPAL TUNNEL RELEASE     [place]  CORONARY ARTERY BYPASS GRAFT     DILATION AND CURETTAGE OF UTERUS     LEFT HEART CATH AND CORS/GRAFTS ANGIOGRAPHY N/A 05/14/2021   Procedure: LEFT HEART CATH AND CORS/GRAFTS ANGIOGRAPHY;   Surgeon: Martinique, Peter M, MD;  Location: [place] [facility];  Service: Cardiovascular;  Laterality: N/A;   WRIST FRACTURE SURGERY      Current Medications: Current Meds  Medication Sig   acetaminophen (TYLENOL) 325 MG tablet Take 650 mg by mouth every 4 (four) hours as needed for moderate pain or headache.   albuterol (VENTOLIN HFA) 108 (90 Base) MCG/ACT inhaler Inhale 2 puffs into the lungs every 4 (four) hours as needed for wheezing.   budesonide-formoterol (SYMBICORT) 160-4.5 MCG/ACT inhaler Inhale 2 puffs into the lungs 2 (two) times daily.   carvedilol (COREG) 6.25 MG tablet Take 1 tablet (6.25 mg total) by mouth 2 (two) times daily with a meal.   clopidogrel (PLAVIX) 75 MG tablet Take 75 mg by mouth daily.   dapagliflozin propanediol (FARXIGA) 10 MG TABS tablet Take 10 mg by mouth daily.   diphenhydrAMINE (BENADRYL) 25 mg capsule Take 1 capsule by mouth every 4 (four) hours as needed for itching.   hydrALAZINE (APRESOLINE) 10 MG tablet TAKE ONE (1) TABLET BY MOUTH 3 TIMES DAILY (Patient taking differently: Take 10 mg by mouth every 6 (six) hours.)   HYDROcodone-acetaminophen (NORCO/VICODIN) 5-325 MG tablet Take 1 tablet by mouth every 12 (twelve) hours as needed for moderate pain.   Insulin Glargine, 1 Unit Dial, (TOUJEO SOLOSTAR) 300 UNIT/ML SOPN Inject 14 Units into the skin at bedtime.   insulin lispro (HUMALOG) 100 UNIT/ML KwikPen Inject 12 Units into the skin 3 (three) times daily before meals.   ipratropium-albuterol (DUONEB) 0.5-2.5 (3) MG/3ML SOLN Take 3 mLs by nebulization as needed for shortness of breath or wheezing.   isosorbide mononitrate (IMDUR) 30 MG 24 hr tablet Take 1 tablet (30 mg total) by mouth daily.   Lactobacillus TABS Take 1 tablet by mouth every other day.   melatonin 3 MG TABS tablet Take 3 mg by mouth at bedtime.   omeprazole (PRILOSEC) 40 MG capsule Take 40 mg by mouth daily.   Probiotic Product (PROBIOTIC & ACIDOPHILUS EX ST PO) Take 1 capsule by mouth  daily.   rosuvastatin (CRESTOR) 10 MG tablet Take 1 tablet (10 mg total) by mouth daily.   Torsemide 40 MG TABS Take 40 mg by mouth 2 (two) times daily.   witch hazel-glycerin (TUCKS) pad Apply 1 Application topically as needed for itching.   [DISCONTINUED] Nutritional Supplements (ARGINAID) PACK Take 1 packet by mouth See admin instructions. Mix 1 packet with 4-6 ounces of water twice a day   [DISCONTINUED] promethazine (PHENERGAN) 12.5 MG tablet Take 1 tablet by mouth every 6 (six) hours as needed for nausea or vomiting.   [DISCONTINUED] promethazine (PHENERGAN) 12.5 MG tablet Take 12.5 mg by mouth every 6 (six) hours as needed for nausea or vomiting.   [DISCONTINUED] Torsemide (SOAANZ) 40 MG TABS Take by mouth.     Allergies:   Pitocin [oxytocin], Tea, Wound dressing adhesive, and Ezetimibe-simvastatin   Social History   Socioeconomic History   Marital status: Widowed    Spouse name: Not on file   Number of children: Not on file   Years of education: Not on file   Highest education level: Not on file  Occupational History   Not on file  Tobacco Use   Smoking status: Never   Smokeless tobacco: Never  Vaping  Use   Vaping Use: Never used  Substance and Sexual Activity   Alcohol use: Never   Drug use: Never   Sexual activity: Not on file  Other Topics Concern   Not on file  Social History Narrative   Not on file   Social Determinants of Health   Financial Resource Strain: Not on file  Food Insecurity: No Food Insecurity (04/18/2022)   Hunger Vital Sign    Worried About Running Out of Food in the Last Year: Never true    Ran Out of Food in the Last Year: Never true  Transportation Needs: No Transportation Needs (04/18/2022)   PRAPARE - Hydrologist (Medical): No    Lack of Transportation (Non-Medical): No  Physical Activity: Not on file  Stress: Not on file  Social Connections: Not on file     Family History: The patient's family history  includes Alzheimer's disease in her mother; Aneurysm in her father; Breast cancer in her mother and sister; Diabetes in her sister, sister, and sister; Hypertension in her mother; Kidney failure in her mother; Stroke in her mother. ROS:   Please see the history of present illness.    All 14 point review of systems negative except as described per history of present illness  EKGs/Labs/Other Studies Reviewed:      Recent Labs: 04/12/2022: NT-Pro BNP 13,246 04/18/2022: ALT 18; B Natriuretic Peptide 2,427.9 04/19/2022: Hemoglobin 9.1; Platelets 145 05/03/2022: BUN 70; Creatinine, Ser 3.09; Magnesium 2.0; Potassium 3.7; Sodium 131  Recent Lipid Panel    Component Value Date/Time   CHOL 100 04/18/2022 1827   TRIG 56 04/18/2022 1827   HDL 52 04/18/2022 1827   CHOLHDL 1.9 04/18/2022 1827   VLDL 11 04/18/2022 1827   LDLCALC 37 04/18/2022 1827   LDLDIRECT 53 08/06/2021 1337    Physical Exam:    VS:  BP 136/62 (BP Location: Left Arm, Patient Position: Sitting)   Pulse (!) 58   Ht 4' 9.5" (1.461 m)   Wt 135 lb 6.4 oz (61.4 kg)   SpO2 97%   BMI 28.79 kg/m     Wt Readings from Last 3 Encounters:  05/27/22 135 lb 6.4 oz (61.4 kg)  05/05/22 157 lb (71.2 kg)  05/03/22 159 lb 9.8 oz (72.4 kg)     GEN:  Well nourished, well developed in no acute distress HEENT: Normal NECK: No JVD; No carotid bruits LYMPHATICS: No lymphadenopathy CARDIAC: RRR, no murmurs, no rubs, no gallops RESPIRATORY:  Clear to auscultation without rales, wheezing or rhonchi  ABDOMEN: Soft, non-tender, non-distended MUSCULOSKELETAL:  No edema; No deformity  SKIN: Warm and dry LOWER EXTREMITIES: no swelling NEUROLOGIC:  Alert and oriented x 3 PSYCHIATRIC:  Normal affect   ASSESSMENT:    1. Coronary artery disease involving coronary bypass graft of native heart without angina pectoris   2. Benign essential hypertension   3. Ischemic cardiomyopathy   4. Chronic combined systolic and diastolic congestive heart  failure ([place])   5. History of CVA (cerebrovascular accident)    PLAN:    In order of problems listed above:  Coronary disease stable from that point review denies have any signs and symptoms of worsening of this condition.  We will continue present management. Congestive heart failure with mildly reduced left ventricle ejection fraction, she seems to be compensated on the physical exam.  She check her weight on the regular basis is stable and actually going down.  Recently dose of torsemide has been reduced  which resulted in improvement he will kidney function with creatinine 1.4.  She is seeing nephrologist in December.  One of the admission she had was because of hypokalemia.  Does have a being corrected and last potassium was normal.  Overall she looks very good from congestive heart failure point of view and I will continue present management which include carvedilol which is beta-blocker, SGLT 2 blocker, hydralazine. History of CVA.  Stable no new issues.  Dyslipidemia: She is on Crestor 10 which I will continue I did review K PN which show me LDL of 37 HDL 52 good cholesterol profile which I will continue.   Medication Adjustments/Labs and Tests Ordered: Current medicines are reviewed at length with the patient today.  Concerns regarding medicines are outlined above.  No orders of the defined types were placed in this encounter.  Medication changes: No orders of the defined types were placed in this encounter.   Signed, Park Liter, MD, [facility] 05/27/2022 11:59 AM    [place]

## 2022-05-27 NOTE — Patient Instructions (Signed)

## 2022-08-30 ENCOUNTER — Ambulatory Visit: Payer: Medicare Other | Attending: Cardiology | Admitting: Cardiology

## 2022-08-30 ENCOUNTER — Encounter: Payer: Self-pay | Admitting: Cardiology

## 2022-08-30 VITALS — BP 160/82 | HR 64 | Ht <= 58 in | Wt 126.8 lb

## 2022-08-30 DIAGNOSIS — I5042 Chronic combined systolic (congestive) and diastolic (congestive) heart failure: Secondary | ICD-10-CM

## 2022-08-30 DIAGNOSIS — N1832 Chronic kidney disease, stage 3b: Secondary | ICD-10-CM | POA: Diagnosis not present

## 2022-08-30 DIAGNOSIS — I25810 Atherosclerosis of coronary artery bypass graft(s) without angina pectoris: Secondary | ICD-10-CM

## 2022-08-30 DIAGNOSIS — E782 Mixed hyperlipidemia: Secondary | ICD-10-CM

## 2022-08-30 DIAGNOSIS — I255 Ischemic cardiomyopathy: Secondary | ICD-10-CM

## 2022-08-30 NOTE — Patient Instructions (Signed)

## 2022-08-30 NOTE — Progress Notes (Signed)
Cardiology Office Note:    Date:  08/30/2022   ID:  MIAKODA Alexandra Baker, DOB Oct 26, 1950, MRN [id number]  PCP:  Dagmar Hait, PA-C  Cardiologist:  [name], MD    Referring MD: Alma Friendly, MD   Chief Complaint  Patient presents with   Follow-up    L leg has pre existing open woun R leg has a sore  Lost 80 lbs of fluids    History of Present Illness:    Alexandra Baker is a 72 y.o. female with past medical history significant for coronary bypass graft done in 1999 at [facility], last cardiac catheterization done in October 2022 showing severe triple-vessel disease, patent LIMA to LAD patent SVG to RCA there was a 50% stenosis of origin of the graft SVG to RCA, at that time high LV filling pressure with LVEDP of 34 mmHg.  Her circumflex artery was find to be very tortuous felt not to become a candidate for any intervention.  Additional problem include cardiomyopathy with ejection fraction 45 to 50% last check in summer 2023, also have essential hypertension, dyslipidemia, unhealing ulcers on her legs, chronic kidney failure She is coming today to months for follow-up and after being she looks very good she left significant mount of weight 60 pounds and she feels dramatically better.  She changed her primary care physician and she is very happy with a new primary care physician.  Denies have any cardiac complaints.  There is no chest pain tightness squeezing pressure burning chest no palpitations dizziness swelling of lower extremities  Past Medical History:  Diagnosis Date   Age-related osteoporosis without current pathological fracture 06/23/2020   Formatting of this note might be different from the original. Bone density January 2021 T score -2.6 to right hip -1.1 to lumbar spine patient refusing treatment   Asthmatic bronchitis without complication 18/84/1660   Managed by dr. Alcide Clever   Benign essential hypertension 05/11/2016   Cardiomyopathy ([place])  ejection fraction 4045% in November 2020 07/18/2019   Cellulitis of left lower extremity 05/29/2019   Chronic combined systolic and diastolic CHF (congestive heart failure) ([place]) 12/31/2019   Seeing cards in [place]   Congestive heart failure ([place]) New York Heart Association class III 07/18/2019   Coronary disease 07/18/2019   Dyslipidemia 12/25/2019   GAD (generalized anxiety disorder) 07/18/2019   GERD (gastroesophageal reflux disease) 07/18/2019   Heme positive stool 06/20/2018   Refuses colonoscopy   Hyperlipidemia 05/11/2016   Hypertension 07/18/2019   Ischemic cardiomyopathy 12/31/2019   Medically noncompliant 09/12/2018   Memory loss 06/20/2018   5/5 mini cog refuses neuro eval Since heart surgery 11/19   Mild intermittent asthma without complication 63/08/6008   Managed by dr. Alcide Clever   Severe obesity (BMI 35.0-39.9) with comorbidity ([place]) 12/30/2020   Stage 2 chronic kidney disease 02/14/2017   Stage 3b chronic kidney disease ([place]) 02/14/2017   Status post coronary artery bypass graft 1999 done at [facility] 07/18/2019   Tremor 06/20/2018   Declines neuro eval   Uncontrolled type 2 diabetes mellitus with hyperglycemia ([place]) 05/11/2016   Uncontrolled type 2 diabetes mellitus with microalbuminuria, with long-term current use of insulin 07/18/2019   Managed by dr. Tamala Julian endo exclusively  Pt noncompliant with meds and ov    Past Surgical History:  Procedure Laterality Date   CARPAL [place]  DILATION AND CURETTAGE OF UTERUS     LEFT HEART CATH AND CORS/GRAFTS ANGIOGRAPHY N/A 05/14/2021   Procedure: LEFT HEART CATH AND CORS/GRAFTS ANGIOGRAPHY;  Surgeon: Martinique, Peter M, MD;  Location: [place] [facility];  Service: Cardiovascular;  Laterality: N/A;   WRIST FRACTURE SURGERY      Current Medications: Current Meds  Medication Sig   albuterol (VENTOLIN HFA) 108 (90 Base) MCG/ACT  inhaler Inhale 2 puffs into the lungs every 4 (four) hours as needed for wheezing.   budesonide-formoterol (SYMBICORT) 160-4.5 MCG/ACT inhaler Inhale 2 puffs into the lungs 2 (two) times daily.   carvedilol (COREG) 6.25 MG tablet Take 1 tablet (6.25 mg total) by mouth 2 (two) times daily with a meal.   clopidogrel (PLAVIX) 75 MG tablet Take 75 mg by mouth daily.   dapagliflozin propanediol (FARXIGA) 10 MG TABS tablet Take 10 mg by mouth daily.   hydrALAZINE (APRESOLINE) 10 MG tablet TAKE ONE (1) TABLET BY MOUTH 3 TIMES DAILY (Patient taking differently: Take 10 mg by mouth every 6 (six) hours.)   Insulin Glargine, 1 Unit Dial, (TOUJEO SOLOSTAR) 300 UNIT/ML SOPN Inject 24 Units into the skin at bedtime.   insulin lispro (HUMALOG) 100 UNIT/ML KwikPen Inject 12 Units into the skin See admin instructions. Sliding scale   ipratropium-albuterol (DUONEB) 0.5-2.5 (3) MG/3ML SOLN Take 3 mLs by nebulization as needed for shortness of breath or wheezing.   isosorbide mononitrate (IMDUR) 30 MG 24 hr tablet Take 1 tablet (30 mg total) by mouth daily.   Lactobacillus TABS Take 1 tablet by mouth every other day.   omeprazole (PRILOSEC) 40 MG capsule Take 40 mg by mouth daily as needed (Acid reflux).   Probiotic Product (PROBIOTIC & ACIDOPHILUS EX ST PO) Take 1 capsule by mouth daily.   rosuvastatin (CRESTOR) 10 MG tablet Take 1 tablet (10 mg total) by mouth daily.   torsemide (DEMADEX) 20 MG tablet Take 20 mg by mouth 2 (two) times daily.   [DISCONTINUED] acetaminophen (TYLENOL) 325 MG tablet Take 650 mg by mouth every 4 (four) hours as needed for moderate pain or headache.   [DISCONTINUED] diphenhydrAMINE (BENADRYL) 25 mg capsule Take 1 capsule by mouth every 4 (four) hours as needed for itching.   [DISCONTINUED] HYDROcodone-acetaminophen (NORCO/VICODIN) 5-325 MG tablet Take 1 tablet by mouth every 12 (twelve) hours as needed for moderate pain.   [DISCONTINUED] melatonin 3 MG TABS tablet Take 3 mg by mouth at  bedtime.   [DISCONTINUED] witch hazel-glycerin (TUCKS) pad Apply 1 Application topically as needed for itching.     Allergies:   Pitocin [oxytocin], Tea, Wound dressing adhesive, and Ezetimibe-simvastatin   Social History   Socioeconomic History   Marital status: Widowed    Spouse name: Not on file   Number of children: Not on file   Years of education: Not on file   Highest education level: Not on file  Occupational History   Not on file  Tobacco Use   Smoking status: Never   Smokeless tobacco: Never  Vaping Use   Vaping Use: Never used  Substance and Sexual Activity   Alcohol use: Never   Drug use: Never   Sexual activity: Not on file  Other Topics Concern   Not on file  Social History Narrative   Not on file   Social Determinants of Health   Financial Resource Strain: Not on file  Food Insecurity: No Food Insecurity (04/18/2022)   Hunger Vital Sign    Worried About Running Out of Food  in the Last Year: Never true    [place] in the Last Year: Never true  Transportation Needs: No Transportation Needs (04/18/2022)   PRAPARE - Hydrologist (Medical): No    Lack of Transportation (Non-Medical): No  Physical Activity: Not on file  Stress: Not on file  Social Connections: Not on file     Family History: [name]; Aneurysm in her father; Breast cancer in her mother and sister; Diabetes in her sister, sister, and sister; Hypertension in her mother; Kidney failure in her mother; Stroke in her mother. ROS:   Please see the history of present illness.    All 14 point review of systems negative except as described per history of present illness  EKGs/Labs/Other Studies Reviewed:      Recent Labs: 04/12/2022: NT-Pro BNP 13,246 04/18/2022: ALT 18; B Natriuretic Peptide 2,427.9 04/19/2022: Hemoglobin 9.1; Platelets 145 05/03/2022: BUN 70; Creatinine, Ser 3.09; Magnesium 2.0;  Potassium 3.7; Sodium 131  Recent Lipid Panel    Component Value Date/Time   CHOL 100 04/18/2022 1827   TRIG 56 04/18/2022 1827   HDL 52 04/18/2022 1827   CHOLHDL 1.9 04/18/2022 1827   VLDL 11 04/18/2022 1827   LDLCALC 37 04/18/2022 1827   LDLDIRECT 53 08/06/2021 1337    Physical Exam:    VS:  BP (!) 160/82 (BP Location: Left Arm, Patient Position: Sitting)   Pulse 64   Ht 4' 9.5" (1.461 m)   Wt 126 lb 12.8 oz (57.5 kg)   SpO2 98%   BMI 26.96 kg/m     Wt Readings from Last 3 Encounters:  08/30/22 126 lb 12.8 oz (57.5 kg)  05/27/22 135 lb 6.4 oz (61.4 kg)  05/05/22 157 lb (71.2 kg)     GEN:  Well nourished, well developed in no acute distress HEENT: Normal NECK: No JVD; No carotid bruits LYMPHATICS: No lymphadenopathy CARDIAC: RRR, no murmurs, no rubs, no gallops RESPIRATORY:  Clear to auscultation without rales, wheezing or rhonchi  ABDOMEN: Soft, non-tender, non-distended MUSCULOSKELETAL:  No edema; No deformity  SKIN: Warm and dry LOWER EXTREMITIES: no swelling NEUROLOGIC:  Alert and oriented x 3 PSYCHIATRIC:  Normal affect   ASSESSMENT:    1. Coronary artery disease involving coronary bypass graft of native heart without angina pectoris   2. Ischemic cardiomyopathy   3. Chronic combined systolic and diastolic CHF (congestive heart failure) (H[facility])   4. Stage 3b chronic kidney disease ([place])   5. Mixed hyperlipidemia    PLAN:    In order of problems listed above:  Coronary disease stable from that point review on appropriate medication which I will continue. Congestive heart failure ejection fraction 45%.  She is stable look hemodynamically compensated and actually looks quite good today after admit. Essential hypertension blood pressure elevated but she tells me at home it is always good she just took her medications.  Therefore we will continue monitoring. Dyslipidemia, she is taking Crestor 10 which I will continue I did review K PN which show me her LDL of  37 HDL 52 which is a good profile we will continue present management. Cardiomyopathy.  She is not a candidate for ARB ACE inhibitor or Entresto secondary to kidney dysfunction, she is a combination of hydralazine and long-acting nitrates, on top of that she is taking SGLT2 blocker which I will continue. Overall have to admit she looks very good.  She is very happy  where she feels I asked her to check weight on the regular basis which she already does.  To let me know if weight goes up.   Medication Adjustments/Labs and Tests Ordered: Current medicines are reviewed at length with the patient today.  Concerns regarding medicines are outlined above.  No orders of the defined types were placed in this encounter.  Medication changes: No orders of the defined types were placed in this encounter.   Signed, Park Liter, MD, [facility] : [facility] 08/30/2022 9:29 AM    [place]
